# Patient Record
Sex: Female | Born: 2000 | Race: Black or African American | Hispanic: No | Marital: Single | Smoking: Former smoker
Health system: Southern US, Community
[De-identification: ages and names within clinical notes are randomized; demographics above are authoritative.]

## PROBLEM LIST (undated history)

## (undated) DIAGNOSIS — N944 Primary dysmenorrhea: Secondary | ICD-10-CM

## (undated) DIAGNOSIS — F329 Major depressive disorder, single episode, unspecified: Secondary | ICD-10-CM

## (undated) DIAGNOSIS — R519 Headache, unspecified: Secondary | ICD-10-CM

## (undated) DIAGNOSIS — N6019 Diffuse cystic mastopathy of unspecified breast: Secondary | ICD-10-CM

## (undated) DIAGNOSIS — Z7289 Other problems related to lifestyle: Secondary | ICD-10-CM

## (undated) DIAGNOSIS — F32A Depression, unspecified: Secondary | ICD-10-CM

## (undated) DIAGNOSIS — T7840XA Allergy, unspecified, initial encounter: Secondary | ICD-10-CM

## (undated) DIAGNOSIS — R51 Headache: Secondary | ICD-10-CM

## (undated) DIAGNOSIS — M25561 Pain in right knee: Secondary | ICD-10-CM

## (undated) DIAGNOSIS — L709 Acne, unspecified: Secondary | ICD-10-CM

## (undated) DIAGNOSIS — Z8659 Personal history of other mental and behavioral disorders: Secondary | ICD-10-CM

## (undated) DIAGNOSIS — Z915 Personal history of self-harm: Secondary | ICD-10-CM

## (undated) DIAGNOSIS — IMO0002 Reserved for concepts with insufficient information to code with codable children: Secondary | ICD-10-CM

## (undated) DIAGNOSIS — O133 Gestational [pregnancy-induced] hypertension without significant proteinuria, third trimester: Secondary | ICD-10-CM

## (undated) DIAGNOSIS — F419 Anxiety disorder, unspecified: Secondary | ICD-10-CM

## (undated) DIAGNOSIS — H5213 Myopia, bilateral: Secondary | ICD-10-CM

## (undated) DIAGNOSIS — J45909 Unspecified asthma, uncomplicated: Secondary | ICD-10-CM

## (undated) HISTORY — DX: Primary dysmenorrhea: N94.4

## (undated) HISTORY — DX: Pain in right knee: M25.561

## (undated) HISTORY — DX: Acne, unspecified: L70.9

## (undated) HISTORY — DX: Anxiety disorder, unspecified: F41.9

## (undated) HISTORY — DX: Major depressive disorder, single episode, unspecified: F32.9

## (undated) HISTORY — DX: Allergy, unspecified, initial encounter: T78.40XA

## (undated) HISTORY — DX: Reserved for concepts with insufficient information to code with codable children: IMO0002

## (undated) HISTORY — DX: Unspecified asthma, uncomplicated: J45.909

## (undated) HISTORY — DX: Headache, unspecified: R51.9

## (undated) HISTORY — DX: Personal history of other mental and behavioral disorders: Z86.59

## (undated) HISTORY — DX: Diffuse cystic mastopathy of unspecified breast: N60.19

## (undated) HISTORY — DX: Gestational (pregnancy-induced) hypertension without significant proteinuria, third trimester: O13.3

## (undated) HISTORY — DX: Headache: R51

## (undated) HISTORY — DX: Other problems related to lifestyle: Z72.89

## (undated) HISTORY — DX: Personal history of self-harm: Z91.5

## (undated) HISTORY — DX: Depression, unspecified: F32.A

## (undated) HISTORY — DX: Myopia, bilateral: H52.13

---
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
## 2019-01-19 ENCOUNTER — Ambulatory Visit: Payer: No Typology Code available for payment source | Admitting: Family Medicine

## 2019-01-31 ENCOUNTER — Telehealth: Payer: Self-pay | Admitting: Emergency Medicine

## 2019-01-31 ENCOUNTER — Encounter: Payer: Self-pay | Admitting: Family Medicine

## 2019-01-31 ENCOUNTER — Ambulatory Visit (INDEPENDENT_AMBULATORY_CARE_PROVIDER_SITE_OTHER): Payer: No Typology Code available for payment source | Admitting: Family Medicine

## 2019-01-31 ENCOUNTER — Other Ambulatory Visit: Payer: Self-pay

## 2019-01-31 VITALS — BP 122/84 | HR 97 | Temp 98.2°F | Resp 16 | Ht 66.0 in | Wt 175.1 lb

## 2019-01-31 DIAGNOSIS — J4540 Moderate persistent asthma, uncomplicated: Secondary | ICD-10-CM

## 2019-01-31 DIAGNOSIS — F4310 Post-traumatic stress disorder, unspecified: Secondary | ICD-10-CM

## 2019-01-31 DIAGNOSIS — F411 Generalized anxiety disorder: Secondary | ICD-10-CM

## 2019-01-31 DIAGNOSIS — F410 Panic disorder [episodic paroxysmal anxiety] without agoraphobia: Secondary | ICD-10-CM | POA: Diagnosis not present

## 2019-01-31 DIAGNOSIS — F339 Major depressive disorder, recurrent, unspecified: Secondary | ICD-10-CM | POA: Diagnosis not present

## 2019-01-31 DIAGNOSIS — F39 Unspecified mood [affective] disorder: Secondary | ICD-10-CM

## 2019-01-31 NOTE — Progress Notes (Signed)
Name: Grace [name]   MRN: [id number]    DOB: 04/12/2001   Date:01/31/2019       Progress Note  Subjective  Chief Complaint  Chief Complaint  Patient presents with  . Follow-up    HPI  Pt presents to follow up:  N/V: Has completely resolved since last visit.  No abdominal pain or diarrhea.  Still has some constipation - taking Miralax PRN.   Depression: Still very depressed, phq 9 is very high, under the care of [facility], advised to contact them back to adjust medication, she still sees a therapist also. She has history of SI, and endorses passive SI a few weeks ago but denies today.  She has broken up with her girlfriend and is currently dating a boyfriend who is in prison until October 2020. She ID's her boyfriends' mother as her source of support.    [facility]  PHQ-9 Total Score  23      Asthma: - Current Medication Regimen: Symbicort once daily - advised this is Rx'd BID; Using medications With/without: without issues - Symptoms are not well controlled -  - Nighttime Symptoms: weekly nighttime asthma symptoms - symptoms include non-productive cough - Daytime Symptoms: weekly daytime asthma symptoms - symptoms include non-productive cough - ER visits since last visit: - Missed work or school: - Precipitationg Factors/Irritants: animal dander, smoke, emotional upset  Patient Active Problem List   Diagnosis Date Noted  . Mood disorder (HCC) 11/15/2018  . PTSD (post-traumatic stress disorder) 11/15/2018  . Class 1 obesity due to excess calories without serious comorbidity with body mass index (BMI) of 30.0 to 30.9 in adult 11/15/2018  . Neutropenia (HCC) 08/06/2018  . Major depression, recurrent, chronic (HCC) 08/06/2018  . GAD (generalized anxiety disorder) 08/06/2018  . Panic attack 01/22/2016  . Chronic allergic rhinitis 12/20/2015  . Asthma, moderate persistent, poorly-controlled 12/20/2015  . Primary dysmenorrhea  12/20/2015    Past Surgical History:  Procedure Laterality Date  . LAPAROSCOPY  05/07/2017   Procedure: LAPAROSCOPY DIAGNOSTIC;  Surgeon: Feliberto GottronSchermerhorn, Ihor Austinhomas J, MD;  Location: [facility] ORS;  Service: Gynecology;;    Family History  Problem Relation Age of Onset  . Asthma Father   . Hypertension Father   . ADD / ADHD Brother     Social History   Socioeconomic History  . Marital status: Single    Spouse name: Not on file  . Number of children: 0  . Years of education: Not on file  . Highest education level: Some college, no degree  Occupational History  . Occupation: crew   Social Needs  . Financial resource strain: Very hard  . Food insecurity:    Worry: Often true    Inability: Often true  . Transportation needs:    Medical: Yes    Non-medical: Yes  Tobacco Use  . Smoking status: Never Smoker  . Smokeless tobacco: Never Used  Substance and Sexual Activity  . Alcohol use: No    Alcohol/week: 0.0 standard drinks  . Drug use: Yes    Types: Marijuana  . Sexual activity: Yes    Birth control/protection: None    Comment: homosexual   Lifestyle  . Physical activity:    Days per week: 2 days    Minutes per session: 30 min  . Stress: To some extent  Relationships  . Social connections:    Talks on phone: More than three times a week    Gets together: More than  three times a week    Attends religious service: Never    Active member of club or organization: No    Attends meetings of clubs or organizations: Never    Relationship status: Living with partner  . Intimate partner violence:    Fear of current or ex partner: No    Emotionally abused: No    Physically abused: No    Forced sexual activity: No  Other Topics Concern  . Not on file  Social History Narrative   Relationship with parents is a little better     Current Outpatient Medications:  .  albuterol (PROVENTIL HFA;VENTOLIN HFA) 108 (90 Base) MCG/ACT inhaler, Inhale 2 puffs into the lungs every 6 (six)  hours as needed for wheezing or shortness of breath., Disp: 1 Inhaler, Rfl: 1 .  ARIPiprazole (ABILIFY) 5 MG tablet, Take 1 tablet (5 mg total) by mouth daily., Disp: 30 tablet, Rfl: 0 .  budesonide-formoterol (SYMBICORT) 160-4.5 MCG/ACT inhaler, Inhale 2 puffs into the lungs 2 (two) times daily., Disp: 1 Inhaler, Rfl: 2 .  cetirizine (ZYRTEC) 10 MG tablet, Take 1 tablet (10 mg total) by mouth daily., Disp: 90 tablet, Rfl: 1 .  escitalopram (LEXAPRO) 5 MG tablet, Take 1 tablet (5 mg total) by mouth daily., Disp: 30 tablet, Rfl: 0 .  famotidine (PEPCID) 40 MG tablet, Take 1 tablet (40 mg total) by mouth at bedtime., Disp: 30 tablet, Rfl: 0 .  montelukast (SINGULAIR) 10 MG tablet, Take 1 tablet (10 mg total) by mouth at bedtime., Disp: 90 tablet, Rfl: 1 .  ondansetron (ZOFRAN) 4 MG tablet, Take 1 tablet (4 mg total) by mouth every 8 (eight) hours as needed for nausea or vomiting., Disp: 20 tablet, Rfl: 0 .  polyethylene glycol powder (GLYCOLAX/MIRALAX) 17 GM/SCOOP powder, Take 17 g by mouth daily., Disp: 3350 g, Rfl: 1 .  Spacer/Aero-Holding Chambers (OPTICHAMBER Elanor-LG MASK) DEVI, See admin instructions. use with inhaler, Disp: , Rfl: 0  Allergies  Allergen Reactions  . Mold Extract [Trichophyton] Other (See Comments)    Causes chest pain Per allergy test    I personally reviewed active problem list, medication list, allergies, notes from last encounter with the patient/caregiver today.   ROS  Ten systems reviewed and is negative except as mentioned in HPI.  Objective  Vitals:   01/31/19 1308  BP: 122/84  Pulse: 97  Resp: 16  Temp: 98.2 F (36.8 C)  TempSrc: Oral  SpO2: 99%  Weight: 175 lb 1.6 oz (79.4 kg)  Height: 5\' 6"  (1.676 m)    Body mass index is 28.26 kg/m.  Physical Exam Constitutional: Patient appears well-developed and well-nourished. No distress.  HENT: Head: Normocephalic and atraumatic. Ears: bilateral TMs with no erythema or effusion; Nose: Nose normal.  Mouth/Throat: Oropharynx is clear and moist. No oropharyngeal exudate or tonsillar swelling.  Eyes: Conjunctivae and EOM are normal. No scleral icterus.  Pupils are equal, round, and reactive to light.  Neck: Normal range of motion. Neck supple. No JVD present. No thyromegaly present.  Cardiovascular: Normal rate, regular rhythm and normal heart sounds.  No murmur heard. No BLE edema. Pulmonary/Chest: Effort normal and breath sounds normal. No respiratory distress. Musculoskeletal: Normal range of motion, no joint effusions. No gross deformities Neurological: Pt is alert and oriented to person, place, and time. No cranial nerve deficit. Coordination, balance, strength, speech and gait are normal.  Skin: Skin is warm and dry. No rash noted. No erythema.  Psychiatric: Patient has a normal mood and affect.  behavior is normal. Judgment and thought content normal.  No results found for this or any previous visit (from the past 72 hour(s)).  PHQ2/9: [facility] 01/31/2019 12/20/2018 12/06/2018 11/15/2018 09/03/2018  Decreased Interest 3 2 2 3 3   Down, Depressed, Hopeless 3 2 2 2 3   PHQ - 2 Score 6 4 4 5 6   Altered sleeping 2 3 2 3 3   Tired, decreased energy 2 2 3 3 3   Change in appetite 3 3 2 3 3   Feeling bad or failure about yourself  3 3 3 3 3   Trouble concentrating 3 3 3 2 2   Moving slowly or fidgety/restless 3 2 2 3 3   Suicidal thoughts 1 1 0 - 2  PHQ-9 Score 23 21 19 22 25   Difficult doing work/chores Very difficult Very difficult Very difficult Very difficult Extremely dIfficult  Some recent data might be hidden   PHQ-2/9 Result is positive.    Fall Risk: Fall Risk  12/20/2018 12/06/2018 11/15/2018 09/03/2018 08/27/2018  Falls in the past year? 0 0 0 0 0  Number falls in past yr: 0 0 0 0 0  Injury with Fall? 0 0 0 0 0  Follow up - - Falls evaluation completed Falls evaluation completed -   Assessment & Plan  1. Asthma, moderate persistent, poorly-controlled - Advised  Symicort should be TWICE daily, not once to help control cough a bit better. Albuterol PRN  2. PTSD (post-traumatic stress disorder) 3. Panic attack 4. Major depression, recurrent, chronic (HCC) 5. Mood disorder (HCC) 6. GAD (generalized anxiety disorder) - She is seeing [facility] but has not been able to get through, Myriam JacobsonHelen CMA will reach out to them today.  She is given the Mobile Crisis number again and she has it saved in her phone now.  She does contract for safety and agrees to call mobile crisis or 911 if she has self harm or suicidal ideation; she is living with her parents again and ID's her Dad and her boyfriends' mother as her support persons..Marland Kitchen

## 2019-01-31 NOTE — Patient Instructions (Signed)
Here are some resources to help you if you feel you are in a mental health crisis:  National Suicide Prevention Lifeline - Call 1-800-273-8255  for help - Website with more resources: https://suicidepreventionlifeline.org/  [facility] Mobile Crisis Program - Call 336-538-1220 for help. - Mobile Crisis Program available 24 hours a day, 365 days a year. - Available for anyone of any age in [place].  [facility] - Address: [address], [place] - Telephone: [phone]  - Hours of Operation: Sunday - Saturday - 8:00 a.m. - 8:00 p.m. - Medicaid, Medicare (Government Issued Only), BCBS, and Cash - Pay - Crisis Management, Outpatient Individual & Group Therapy, Psychiatrists on-site to provide medication management, In-Home Psychiatric Care, and Peer Support Care.  [facility] - Call 1-877-626-1772 for help. - Mobile Crisis Program available 24 hours a day, 365 days a year. - Available for anyone of any age in [place]    

## 2019-01-31 NOTE — Telephone Encounter (Signed)
Patient notified. Spoke to [employer] gave [name] the therapist number Gareth Eagle at [phone] for her to call and schedule appointment. [facility] appointment with doctor is scheduled for July 8th at 1:20pm

## 2019-02-16 ENCOUNTER — Encounter: Payer: Self-pay | Admitting: Family Medicine

## 2019-02-17 ENCOUNTER — Encounter: Payer: Self-pay | Admitting: Family Medicine

## 2019-02-21 NOTE — Telephone Encounter (Signed)
Pt states that the company advise they have not received fax. Pt would like the physical form refaxed to [phone]/ please advise

## 2019-03-02 ENCOUNTER — Other Ambulatory Visit: Payer: Self-pay

## 2019-03-02 ENCOUNTER — Ambulatory Visit (LOCAL_COMMUNITY_HEALTH_CENTER): Payer: No Typology Code available for payment source

## 2019-03-02 DIAGNOSIS — Z113 Encounter for screening for infections with a predominantly sexual mode of transmission: Secondary | ICD-10-CM

## 2019-03-02 DIAGNOSIS — N760 Acute vaginitis: Secondary | ICD-10-CM | POA: Diagnosis not present

## 2019-03-02 DIAGNOSIS — B9689 Other specified bacterial agents as the cause of diseases classified elsewhere: Secondary | ICD-10-CM | POA: Diagnosis not present

## 2019-03-02 LAB — WET PREP FOR TRICH, YEAST, CLUE
Trichomonas Exam: NEGATIVE
Yeast Exam: NEGATIVE

## 2019-03-02 MED ORDER — METRONIDAZOLE 500 MG PO TABS: 500.0000 mg | ORAL_TABLET | Freq: Two times a day (BID) | ORAL | 0 refills | Status: AC

## 2019-03-02 NOTE — Progress Notes (Signed)
    STI clinic/screening visit  Subjective:  Grace [name] is a 18 y.o. female being seen today for an STI screening visit. The patient reports they do not have symptoms.  Patient has the following medical conditionshas Chronic allergic rhinitis; Asthma, moderate persistent, poorly-controlled; Primary dysmenorrhea; Panic attack; Neutropenia ([place]); Major depression, recurrent, chronic ([place]); GAD (generalized anxiety disorder); Mood disorder ([place]); PTSD (post-traumatic stress disorder); and Class 1 obesity due to excess calories without serious comorbidity with body mass index (BMI) of 30.0 to 30.9 in adult on their problem list.  Chief Complaint  Patient presents with  . SEXUALLY TRANSMITTED DISEASE    Patient reports  HPI   See flowsheet for further details and programmatic requirements.    The following portions of the patient's history were reviewed and updated as appropriate: allergies, current medications, past family history, past medical history, past social history, past surgical history and problem list. Problem list updated.  Objective:  There were no vitals filed for this visit.  Physical Exam Genitourinary:    General: Normal vulva.     Vagina: Vaginal discharge present.     Comments: Ph,4.5; modf amt of white , thick disch.      Assessment and Plan:  Grace [name] is a 18 y.o. female presenting to the [place] for STI screening  1. Screening examination for venereal disease Treat wet prep - Chlamydia/Gonorrhea [facility] - HIV [place] [facility] - Syphilis Serology, [facility] - WET PREP FOR TRICH, YEAST, CLUE     No follow-ups on file.  Future Appointments  Date Time Provider [place]  03/04/2019 10:00 AM Steele Sizer, MD [place] [facility]  06/02/2019 11:00 AM Hubbard Hartshorn, FNP [place], FNP

## 2019-03-02 NOTE — Progress Notes (Signed)
Wet mount reviewed; per verbal order by C.Marzetta Board, FNP, pt treated for BV per standing order.

## 2019-03-04 ENCOUNTER — Ambulatory Visit: Payer: No Typology Code available for payment source | Admitting: Family Medicine

## 2019-03-09 ENCOUNTER — Other Ambulatory Visit: Payer: Self-pay

## 2019-03-09 ENCOUNTER — Encounter: Payer: Self-pay | Admitting: Emergency Medicine

## 2019-03-09 ENCOUNTER — Emergency Department
Admission: EM | Admit: 2019-03-09 | Discharge: 2019-03-09 | Disposition: A | Discharge status: Home / Self Care | Payer: No Typology Code available for payment source | Attending: Emergency Medicine | Admitting: Emergency Medicine

## 2019-03-09 DIAGNOSIS — R55 Syncope and collapse: Secondary | ICD-10-CM | POA: Insufficient documentation

## 2019-03-09 DIAGNOSIS — Z20828 Contact with and (suspected) exposure to other viral communicable diseases: Secondary | ICD-10-CM | POA: Diagnosis not present

## 2019-03-09 DIAGNOSIS — F17200 Nicotine dependence, unspecified, uncomplicated: Secondary | ICD-10-CM | POA: Insufficient documentation

## 2019-03-09 DIAGNOSIS — J45909 Unspecified asthma, uncomplicated: Secondary | ICD-10-CM | POA: Diagnosis not present

## 2019-03-09 DIAGNOSIS — Z79899 Other long term (current) drug therapy: Secondary | ICD-10-CM | POA: Insufficient documentation

## 2019-03-09 LAB — CBC
HCT: 42 % (ref 36.0–46.0)
Hemoglobin: 13.5 g/dL (ref 12.0–15.0)
MCH: 27.4 pg (ref 26.0–34.0)
MCHC: 32.1 g/dL (ref 30.0–36.0)
MCV: 85.2 fL (ref 80.0–100.0)
Platelets: 392 10*3/uL (ref 150–400)
RBC: 4.93 MIL/uL (ref 3.87–5.11)
RDW: 15.6 % — ABNORMAL HIGH (ref 11.5–15.5)
WBC: 5.6 10*3/uL (ref 4.0–10.5)
nRBC: 0 % (ref 0.0–0.2)

## 2019-03-09 LAB — URINALYSIS, COMPLETE (UACMP) WITH MICROSCOPIC
Bacteria, UA: NONE SEEN
Bilirubin Urine: NEGATIVE
Glucose, UA: NEGATIVE mg/dL
Ketones, ur: NEGATIVE mg/dL
Nitrite: NEGATIVE
Protein, ur: 30 mg/dL — AB
RBC / HPF: >50 RBC/hpf — ABNORMAL HIGH (ref 0–5)
Specific Gravity, Urine: 1.02 (ref 1.005–1.030)
pH: 7 (ref 5.0–8.0)

## 2019-03-09 LAB — COMPREHENSIVE METABOLIC PANEL
ALT: 19 U/L (ref 0–44)
AST: 14 U/L — ABNORMAL LOW (ref 15–41)
Albumin: 4 g/dL (ref 3.5–5.0)
Alkaline Phosphatase: 67 U/L (ref 38–126)
Anion gap: 7 (ref 5–15)
BUN: 9 mg/dL (ref 6–20)
CO2: 25 mmol/L (ref 22–32)
Calcium: 8.7 mg/dL — ABNORMAL LOW (ref 8.9–10.3)
Chloride: 106 mmol/L (ref 98–111)
Creatinine, Ser: 0.82 mg/dL (ref 0.44–1.00)
GFR calc Af Amer: >60 mL/min (ref >60)
GFR calc non Af Amer: >60 mL/min (ref >60)
Glucose, Bld: 86 mg/dL (ref 70–99)
Potassium: 4.2 mmol/L (ref 3.5–5.1)
Sodium: 138 mmol/L (ref 135–145)
Total Bilirubin: 0.7 mg/dL (ref 0.3–1.2)
Total Protein: 7.1 g/dL (ref 6.5–8.1)

## 2019-03-09 LAB — HCG, QUANTITATIVE, PREGNANCY: hCG, Beta Chain, Quant, S: <1 m[IU]/mL (ref <5)

## 2019-03-09 LAB — POCT PREGNANCY, URINE: Preg Test, Ur: NEGATIVE

## 2019-03-09 MED ORDER — SODIUM CHLORIDE 0.9% FLUSH: 3.0000 mL | Freq: Once | INTRAVENOUS | Status: DC

## 2019-03-09 NOTE — ED Notes (Signed)
ED Provider at bedside. 

## 2019-03-09 NOTE — ED Notes (Signed)
Nausea and near syncope. Light vaginal bleeding since yesterday.

## 2019-03-09 NOTE — ED Notes (Signed)
Pt alert and oriented X4, active, cooperative, pt in NAD. RR even and unlabored, color WNL.  Pt informed to return if any life threatening symptoms occur.  Discharge and followup instructions reviewed.  

## 2019-03-09 NOTE — ED Triage Notes (Signed)
First RN Note: Pt reports having vaginal bleeding that began yesterday, pt reports 1 episode of emesis today while on the way to work with hot flashes and near syncope. Pt A&O x 4, ambulatory without difficulty to the front desk at this time.

## 2019-03-09 NOTE — Discharge Instructions (Signed)
Your labs are reassuring.  You should follow-up with your primary care doctor tomorrow if you continue to have abdominal pain.  Also if you have any other concerns you should return to the ER.  You develop fevers worsening vomiting or any other concerns.  We will also test you for coronavirus.  Stay quarantined at home.

## 2019-03-09 NOTE — ED Provider Notes (Signed)
[facility] Emergency Department Provider Note  ____________________________________________   First MD Initiated Contact with Patient 03/09/19 1201     (approximate)  I have reviewed the triage vital signs and the nursing notes.   HISTORY  Chief Complaint Near Syncope and Vaginal Bleeding    HPI Grace [name] is a 18 y.o. female with history of depression who presents for syncope.  Patient had some vaginal bleeding that started yesterday.  The bleeding has been minimal in nature, constant, nothing makes it better or worse.  She feels like it is too early for her mestruation To be starting.  Had one episode of emesis today while at work.  Patient started feeling warm and like she was having hot flashes and then almost passed out but did not actually lose consciousness.  Endorses fatigue over the past few days as well.    Past Medical History:  Diagnosis Date  . Acne   . Allergy    ALLERGIC RHINITIS  . Anxiety   . Asthma   . Deliberate self-cutting   . Depression   . Fibrocystic disease of breast   . Head pain   . History of depression 12/20/2015  . History of self-harm   . Knee pain, right   . Primary dysmenorrhea   . Severe myopia of both eyes     Patient Active Problem List   Diagnosis Date Noted  . BV (bacterial vaginosis) 03/02/2019  . Mood disorder (HCC) 11/15/2018  . PTSD (post-traumatic stress disorder) 11/15/2018  . Class 1 obesity due to excess calories without serious comorbidity with body mass index (BMI) of 30.0 to 30.9 in adult 11/15/2018  . Neutropenia (HCC) 08/06/2018  . Major depression, recurrent, chronic (HCC) 08/06/2018  . GAD (generalized anxiety disorder) 08/06/2018  . Panic attack 01/22/2016  . Chronic allergic rhinitis 12/20/2015  . Asthma, moderate persistent, poorly-controlled 12/20/2015  . 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 LAPAROSCOPY  05/07/2017   Procedure: LAPAROSCOPY DIAGNOSTIC;  Surgeon: Feliberto GottronSchermerhorn, Ihor Austinhomas J, MD;  Location: [facility] ORS;  Service: Gynecology;;    Prior to Admission medications   Medication Sig Start Date End Date Taking? Authorizing Provider  albuterol (PROVENTIL HFA;VENTOLIN HFA) 108 (90 Base) MCG/ACT inhaler Inhale 2 puffs into the lungs every 6 (six) hours as needed for wheezing or shortness of breath. 11/15/18   Doren CustardBoyce, Emily E, FNP  ARIPiprazole (ABILIFY) 5 MG tablet Take 1 tablet (5 mg total) by mouth daily. 09/03/18   Doren CustardBoyce, Emily E, FNP  budesonide-formoterol (SYMBICORT) 160-4.5 MCG/ACT inhaler Inhale 2 puffs into the lungs 2 (two) times daily. 11/15/18   Doren CustardBoyce, Emily E, FNP  cetirizine (ZYRTEC) 10 MG tablet Take 1 tablet (10 mg total) by mouth daily. 11/15/18   Doren CustardBoyce, Emily E, FNP  escitalopram (LEXAPRO) 5 MG tablet Take 1 tablet (5 mg total) by mouth daily. 09/03/18   Doren CustardBoyce, Emily E, FNP  famotidine (PEPCID) 40 MG tablet Take 1 tablet (40 mg total) by mouth at bedtime. 12/20/18   Alba CorySowles, Krichna, MD  metroNIDAZOLE (FLAGYL) 500 MG tablet Take 1 tablet (500 mg total) by mouth 2 (two) times daily for 7 days. 03/02/19 03/09/19  Larene PickettLatta, Cynthia, FNP  montelukast (SINGULAIR) 10 MG tablet Take 1 tablet (10 mg total) by mouth at bedtime. 11/15/18   Doren CustardBoyce, Emily E, FNP  polyethylene glycol powder (GLYCOLAX/MIRALAX) 17 GM/SCOOP powder Take 17 g by mouth daily. 12/20/18   Alba CorySowles, Krichna, MD  Spacer/Aero-Holding [facility](OPTICHAMBER  Kerstin-LG MASK) DEVI See admin instructions. use with inhaler 08/29/16   [provider]    Allergies Mold extract [trichophyton]  Family History  Problem Relation Age of Onset  . Asthma Father   . Hypertension Father   . ADD / ADHD Brother     Social History Social History   Tobacco Use  . Smoking status: Current Every Day Smoker    Start date: 03/02/2007  . Smokeless tobacco: Never Used  . Tobacco comment: unknown date to quit  Substance Use Topics  . Alcohol use: No     Alcohol/week: 0.0 standard drinks  . Drug use: Yes    Frequency: 1.0 times per week    Types: Marijuana      Review of Systems Constitutional: No fever/chills positive for fatigue Eyes: No visual changes. ENT: No sore throat. Cardiovascular: Denies chest pain. Respiratory: Denies shortness of breath. Gastrointestinal: No abdominal pain.  No nausea, no vomiting.  No diarrhea.  No constipation. Genitourinary: Negative for dysuria.  Positive for vaginal bleeding Musculoskeletal: Negative for back pain. Skin: Negative for rash. Neurological: Negative for headaches, focal weakness or numbness.  Positive for near syncope All other ROS negative ____________________________________________   PHYSICAL EXAM:  VITAL SIGNS: ED Triage Vitals  Enc Vitals Group     BP 03/09/19 1028 120/75     Pulse Rate 03/09/19 1028 86     Resp 03/09/19 1028 16     Temp 03/09/19 1028 98.2 F (36.8 C)     Temp Source 03/09/19 1028 Oral     SpO2 03/09/19 1028 98 %     Weight 03/09/19 1027 180 lb (81.6 kg)     Height 03/09/19 1027 5\' 6"  (1.676 m)     Head Circumference --      Peak Flow --      Pain Score 03/09/19 1027 7     Pain Loc --      Pain Edu? --      Excl. in GC? --     Constitutional: Alert and oriented. Well appearing and in no acute distress. Eyes: Conjunctivae are normal. EOMI. Head: Atraumatic. Nose: No congestion/rhinnorhea. Mouth/Throat: Mucous membranes are moist.   Neck: No stridor. Trachea Midline. FROM Cardiovascular: Normal rate, regular rhythm. Grossly normal heart sounds.  Good peripheral circulation. Respiratory: Normal respiratory effort.  No retractions. Lungs CTAB. Gastrointestinal: Soft and nontender. No distention. No abdominal bruits.  Musculoskeletal: No lower extremity tenderness nor edema.  No joint effusions. Neurologic:  Normal speech and language. No gross focal neurologic deficits are appreciated.  Skin:  Skin is warm, dry and intact. No rash noted.  Psychiatric: Mood and affect are normal. Speech and behavior are normal. GU: Deferred   ____________________________________________   LABS (all labs ordered are listed, but only abnormal results are displayed)  Labs Reviewed  CBC - Abnormal; Notable for the following components:      Result Value   RDW 15.6 (*)    All other components within normal limits  COMPREHENSIVE METABOLIC PANEL - Abnormal; Notable for the following components:   Calcium 8.7 (*)    AST 14 (*)    All other components within normal limits  URINALYSIS, COMPLETE (UACMP) WITH MICROSCOPIC  POC URINE PREG, ED   ____________________________________________   ED ECG REPORT I, Concha SeMary E Breasia Karges, the attending physician, personally viewed and interpreted this ECG.  EKG is sinus rate of 82, no ST elevation, no T wave inversion, normal intervals, no evidence of any arrhythmias. ____________________________________________  ____________________________________________  PROCEDURES  Procedure(s) performed (including Critical Care):  Procedures   ____________________________________________   INITIAL IMPRESSION / ASSESSMENT AND PLAN / ED COURSE  Grace [name] was evaluated in Emergency Department on 03/09/2019 for the symptoms described in the history of present illness. She was evaluated in the context of the global COVID-19 pandemic, which necessitated consideration that the patient might be at risk for infection with the SARS-CoV-2 virus that causes COVID-19. Institutional protocols and algorithms that pertain to the evaluation of patients at risk for COVID-19 are in a state of rapid change based on information released by regulatory bodies including the CDC and federal and state organizations. These policies and algorithms were followed during the patient's care in the ED.    Given patient's near syncope will get EKG to evaluate for arrhythmia.  Will also get hCG to evaluate for ectopic pregnancy.  Will even get  a blood hCG given patient has a family history of ectopic and with the early vaginal bleeding.  Will also check labs to evaluate for dehydration.  May need outpatient coronavirus testing.   Labs are reassuring with a normal white count making infectious less likely as well as normal hemoglobin.  1:23 PM reevaluated patient.  Patient is now endorsing a little bit of mild right lower quadrant tenderness.  Patient's white count is normal and patient has no fever however asked patient if she wanted to stay for a CT scan to evaluate for appendicitis.  Patient at this time would like to hold off on CT and would rather follow-up outpatient.  Patient's pain did not sound consistent with torsion.  Again offered additional testing but patient said that she would rather leave at this time.  I discussed with patient return precautions with this pain.  Patient was open to having coronavirus testing sent.  Patient understands that she should have a recheck of her abdominal pain by tomorrow if it is not resolved.  I also discussed with her return precautions to suggest appendicitis or torsion.   ____________________________________________   FINAL CLINICAL IMPRESSION(S) / ED DIAGNOSES   Final diagnoses:  Near syncope      MEDICATIONS GIVEN DURING THIS VISIT:  Medications  sodium chloride flush (NS) 0.9 % injection 3 mL (has no administration in time range)     ED Discharge Orders    None       Note:  This document was prepared using Dragon voice recognition software and may include unintentional dictation errors.   Vanessa Smithton, MD 03/09/19 1620

## 2019-03-10 LAB — NOVEL CORONAVIRUS, NAA (HOSP ORDER, SEND-OUT TO REF LAB; TAT 18-24 HRS): SARS-CoV-2, NAA: NOT DETECTED

## 2019-03-11 ENCOUNTER — Ambulatory Visit: Payer: No Typology Code available for payment source

## 2019-03-11 ENCOUNTER — Other Ambulatory Visit: Payer: Self-pay

## 2019-03-11 DIAGNOSIS — A749 Chlamydial infection, unspecified: Secondary | ICD-10-CM | POA: Diagnosis not present

## 2019-03-11 DIAGNOSIS — Z113 Encounter for screening for infections with a predominantly sexual mode of transmission: Secondary | ICD-10-CM | POA: Diagnosis not present

## 2019-03-11 DIAGNOSIS — A549 Gonococcal infection, unspecified: Secondary | ICD-10-CM | POA: Diagnosis not present

## 2019-03-11 MED ORDER — AZITHROMYCIN 500 MG PO TABS
1000.0000 mg | ORAL_TABLET | Freq: Once | ORAL | Status: AC
  Administered 2019-03-11: 16:00:00 1000 mg via ORAL

## 2019-03-11 MED ORDER — CEFTRIAXONE SODIUM 250 MG IJ SOLR
250.0000 mg | Freq: Once | INTRAMUSCULAR | Status: AC
  Administered 2019-03-11: 16:00:00 250 mg via INTRAMUSCULAR

## 2019-03-23 ENCOUNTER — Encounter: Payer: Self-pay | Admitting: Family Medicine

## 2019-03-24 NOTE — Telephone Encounter (Unsigned)
Copied from [place] [id number]. 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  Pt states she would like you to send to them asap. Pt has sent mychart message.  I advised message states the form would be faxed today, but she did not seem to think that was enough.  I said I would mark urgent.

## 2019-04-05 ENCOUNTER — Encounter: Payer: Self-pay | Admitting: Family Medicine

## 2019-04-05 ENCOUNTER — Ambulatory Visit (INDEPENDENT_AMBULATORY_CARE_PROVIDER_SITE_OTHER): Payer: No Typology Code available for payment source | Admitting: Family Medicine

## 2019-04-05 DIAGNOSIS — Z3490 Encounter for supervision of normal pregnancy, unspecified, unspecified trimester: Secondary | ICD-10-CM

## 2019-04-05 DIAGNOSIS — Z720 Tobacco use: Secondary | ICD-10-CM | POA: Diagnosis not present

## 2019-04-05 DIAGNOSIS — N920 Excessive and frequent menstruation with regular cycle: Secondary | ICD-10-CM | POA: Diagnosis not present

## 2019-04-05 MED ORDER — PRENATAL MULTIVITAMIN CH: 1.0000 | ORAL_TABLET | Freq: Every day | ORAL | 1 refills | Status: DC

## 2019-04-05 NOTE — Patient Instructions (Signed)

## 2019-04-05 NOTE — Progress Notes (Signed)
Name: Grace [name]   MRN: [id number]    DOB: [birth year]   Date:04/05/2019       Progress Note  Subjective  Chief Complaint  Chief Complaint  Patient presents with  . Emesis  . Vaginal Bleeding    I connected with  Grace [name]  on 04/05/19 at  9:40 AM EDT by a video enabled telemedicine application and verified that I am speaking with the correct person using two identifiers.  I discussed the limitations of evaluation and management by telemedicine and the availability of in person appointments. The patient expressed understanding and agreed to proceed. Staff also discussed with the patient that there may be a patient responsible charge related to this service. Patient Location: at home Provider [facility]: [facility]   HPI  Vaginal Bleeding: [facility] for near syncope , light vaginal bleeding, she had a normal CBC, comp panel and negative for STI. She is trying to get pregnant. Having intercourse every day for the past 2 weeks. She states based on otc testing she is ovulating, had bleeding since Sunday it lasted two days followed by some light spotting  She has noticed some headaches, nausea and vomiting yesterday and had to leave work . She states she has been taking pre-natal vitamins, she smokes but is wiling to quit to get pregnant, she does not drink alcohol    Patient Active Problem List   Diagnosis Date Noted  . BV (bacterial vaginosis) 03/02/2019  . Mood disorder (HCC) 11/15/2018  . PTSD (post-traumatic stress disorder) 11/15/2018  . Class 1 obesity due to excess calories without serious comorbidity with body mass index (BMI) of 30.0 to 30.9 in adult 11/15/2018  . Neutropenia (HCC) 08/06/2018  . Major depression, recurrent, chronic (HCC) 08/06/2018  . GAD (generalized anxiety disorder) 08/06/2018  . Panic attack 01/22/2016  . Chronic allergic rhinitis 12/20/2015  . Asthma, moderate persistent, poorly-controlled 12/20/2015  .  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 LAPAROSCOPY  05/07/2017   Procedure: LAPAROSCOPY DIAGNOSTIC;  Surgeon: Feliberto GottronSchermerhorn, Ihor Austinhomas J, MD;  Location: [facility] ORS;  Service: Gynecology;;    Family History  Problem Relation Age of Onset  . Asthma Father   . Hypertension Father   . ADD / ADHD Brother     Social History   Socioeconomic History  . Marital status: Single    Spouse name: Not on file  . Number of children: 0  . Years of education: Not on file  . Highest education level: Some college, no degree  Occupational History  . Occupation: crew   Social Needs  . Financial resource strain: Very hard  . Food insecurity    Worry: Often true    Inability: Often true  . Transportation needs    Medical: Yes    Non-medical: Yes  Tobacco Use  . Smoking status: Current Every Day Smoker    Start date: 03/02/2007  . Smokeless tobacco: Never Used  . Tobacco comment: unknown date to quit  Substance and Sexual Activity  . Alcohol use: No    Alcohol/week: 0.0 standard drinks  . Drug use: Yes    Frequency: 1.0 times per week    Types: Marijuana  . Sexual activity: Yes    Partners: Male    Birth control/protection: Condom    Comment: homosexual   Lifestyle  . Physical activity    Days per week: 2 days  Minutes per session: 30 min  . Stress: To some extent  Relationships  . Social connections    Talks on phone: More than three times a week    Gets together: More than three times a week    Attends religious service: Never    Active member of club or organization: No    Attends meetings of clubs or organizations: Never    Relationship status: Living with partner  . Intimate partner violence    Fear of current or ex partner: No    Emotionally abused: No    Physically abused: No    Forced sexual activity: No  Other Topics Concern  . Not on file  Social History Narrative   Relationship with parents is a little better      Current Outpatient Medications:  .  albuterol (PROVENTIL HFA;VENTOLIN HFA) 108 (90 Base) MCG/ACT inhaler, Inhale 2 puffs into the lungs every 6 (six) hours as needed for wheezing or shortness of breath., Disp: 1 Inhaler, Rfl: 1 .  ARIPiprazole (ABILIFY) 5 MG tablet, Take 1 tablet (5 mg total) by mouth daily., Disp: 30 tablet, Rfl: 0 .  budesonide-formoterol (SYMBICORT) 160-4.5 MCG/ACT inhaler, Inhale 2 puffs into the lungs 2 (two) times daily., Disp: 1 Inhaler, Rfl: 2 .  cetirizine (ZYRTEC) 10 MG tablet, Take 1 tablet (10 mg total) by mouth daily., Disp: 90 tablet, Rfl: 1 .  famotidine (PEPCID) 40 MG tablet, Take 1 tablet (40 mg total) by mouth at bedtime., Disp: 30 tablet, Rfl: 0 .  montelukast (SINGULAIR) 10 MG tablet, Take 1 tablet (10 mg total) by mouth at bedtime., Disp: 90 tablet, Rfl: 1 .  polyethylene glycol powder (GLYCOLAX/MIRALAX) 17 GM/SCOOP powder, Take 17 g by mouth daily., Disp: 3350 g, Rfl: 1 .  sertraline (ZOLOFT) 50 MG tablet, TK 1 T PO QD, Disp: , Rfl:  .  Spacer/Aero-Holding Chambers (OPTICHAMBER [place]-LG MASK) DEVI, See admin instructions. use with inhaler, Disp: , Rfl: 0  Allergies  Allergen Reactions  . Mold Extract [Trichophyton] Other (See Comments)    Causes chest pain Per allergy test    I personally reviewed active problem list, medication list, allergies, family history, social history with the patient/caregiver today.   ROS  Ten systems reviewed and is negative except as mentioned in HPI   Objective  Virtual encounter, vitals not obtained.  There is no height or weight on file to calculate BMI.  Physical Exam  Awake, alert and oriented   PHQ2/9: Depression screen [facility] 2/9 04/05/2019 01/31/2019 12/20/2018 12/06/2018 11/15/2018  Decreased Interest 0 3 2 2 3   Down, Depressed, Hopeless 1 3 2 2 2   PHQ - 2 Score 1 6 4 4 5   Altered sleeping 0 2 3 2 3   Tired, decreased energy 3 2 2 3 3   Change in appetite 0 3 3 2 3   Feeling bad or failure about yourself  1  3 3 3 3   Trouble concentrating 1 3 3 3 2   Moving slowly or fidgety/restless 0 3 2 2 3   Suicidal thoughts 0 1 1 0 -  PHQ-9 Score 6 23 21 19 22   Difficult doing work/chores Not difficult at all Very difficult Very difficult Very difficult Very difficult  Some recent data might be hidden   PHQ-2/9 Result is positive.    Fall Risk: Fall Risk  12/20/2018 12/06/2018 11/15/2018 09/03/2018 08/27/2018  Falls in the past year? 0 0 0 0 0  Number falls in past yr: 0 0 0 0 0  Injury with Fall? 0 0 0 0 0  Follow up - - Falls evaluation completed Falls evaluation completed -    Assessment & Plan  1. Prenatal care, antepartum   2. Spotting   3. Tobacco consumption  Discussed importance of quitting   I discussed the assessment and treatment plan with the patient. The patient was provided an opportunity to ask questions and all were answered. The patient agreed with the plan and demonstrated an understanding of the instructions.  The patient was advised to call back or seek an in-person evaluation if the symptoms worsen or if the condition fails to improve as anticipated.  I provided 15  minutes of non-face-to-face time during this encounter.

## 2019-04-22 ENCOUNTER — Other Ambulatory Visit: Payer: Self-pay | Admitting: Family Medicine

## 2019-04-22 DIAGNOSIS — R112 Nausea with vomiting, unspecified: Secondary | ICD-10-CM

## 2019-05-03 ENCOUNTER — Ambulatory Visit (HOSPITAL_COMMUNITY)
Admission: EM | Admit: 2019-05-03 | Discharge: 2019-05-03 | Disposition: A | Discharge status: Home / Self Care | Payer: No Typology Code available for payment source

## 2019-05-03 ENCOUNTER — Emergency Department (HOSPITAL_COMMUNITY)
Admission: EM | Admit: 2019-05-03 | Discharge: 2019-05-03 | Discharge status: Against Medical Advice | Payer: No Typology Code available for payment source

## 2019-05-03 NOTE — ED Notes (Signed)
Patient check in thinking she would be able to have an ultrasound and complete GYN work up.  After checking in she decided to go to the ED for Excessive vaginal bleeding and pain.

## 2019-05-03 NOTE — ED Notes (Signed)
UNable to locate in waiting room.

## 2019-05-13 ENCOUNTER — Other Ambulatory Visit: Payer: Self-pay | Admitting: Family Medicine

## 2019-05-13 ENCOUNTER — Encounter: Payer: Self-pay | Admitting: Family Medicine

## 2019-05-13 ENCOUNTER — Ambulatory Visit (INDEPENDENT_AMBULATORY_CARE_PROVIDER_SITE_OTHER): Payer: No Typology Code available for payment source | Admitting: Family Medicine

## 2019-05-13 ENCOUNTER — Other Ambulatory Visit: Payer: Self-pay

## 2019-05-13 VITALS — BP 114/80 | HR 94 | Temp 97.7°F | Resp 16 | Ht 66.0 in | Wt 187.7 lb

## 2019-05-13 DIAGNOSIS — N923 Ovulation bleeding: Secondary | ICD-10-CM | POA: Diagnosis not present

## 2019-05-13 DIAGNOSIS — F39 Unspecified mood [affective] disorder: Secondary | ICD-10-CM | POA: Diagnosis not present

## 2019-05-13 DIAGNOSIS — J4540 Moderate persistent asthma, uncomplicated: Secondary | ICD-10-CM

## 2019-05-13 DIAGNOSIS — R112 Nausea with vomiting, unspecified: Secondary | ICD-10-CM

## 2019-05-13 MED ORDER — BUDESONIDE-FORMOTEROL FUMARATE 160-4.5 MCG/ACT IN AERO: 2.0000 | INHALATION_SPRAY | Freq: Two times a day (BID) | RESPIRATORY_TRACT | 2 refills | Status: DC

## 2019-05-13 MED ORDER — MONTELUKAST SODIUM 10 MG PO TABS: 10.0000 mg | ORAL_TABLET | Freq: Every day | ORAL | 1 refills | Status: DC

## 2019-05-13 NOTE — Progress Notes (Signed)
Name: Grace MalletDiamond R [name]   MRN: [id number]    DOB: February 16, 2001   Date:05/13/2019       Progress Note  Subjective  Chief Complaint  Chief Complaint  Patient presents with  . Vaginal Bleeding    Bleeding has resolved, but she still wanted to see you.    HPI  Vaginal bleeding: he last three cycles were 03/08/2019,  04/03/2019,  04/25/2019 , and lasted until 04/29/2019  And re-started on 05/02/2019 normal in flow and lasted 3-4 days, she never had cycles this close together and was concerned about, and because it was painful [facility] . She is sexually active , same partner past 3 months, she has not been using protection. They are trying to conceive . She had a negative hcG, negative GC test, normal CBC normal Comp panel except for slight elevation AST. She is feeling well since, no more bleeding. Discussed keeping a log of her cycles. She asked me about clomid and something to help her get pregnant. Explained no reason to do that since she just started sexual activity with this partner and it may take one year to conceive.   Mood Disorder:  still very depressed, phq 9 is higher than previously at 14 , under the care of [facility], advised to contact them back to adjust medication, she still sees a therapist also. No suicidal thoughts or ideation She states work has been stressful and also worried about not getting pregnant She has been getting more moody and snappy.   Asthma Moderate: she still has nocturnal cough nightly, she has not been taking Symbicort on a regular basis and has noticed that symptoms not as controlled as it used to be. No wheezing or SOB   Patient Active Problem List   Diagnosis Date Noted  . BV (bacterial vaginosis) 03/02/2019  . Mood disorder (HCC) 11/15/2018  . PTSD (post-traumatic stress disorder) 11/15/2018  . Class 1 obesity due to excess calories without serious comorbidity with body mass index (BMI) of 30.0 to 30.9 in adult 11/15/2018  . Neutropenia  (HCC) 08/06/2018  . Major depression, recurrent, chronic (HCC) 08/06/2018  . GAD (generalized anxiety disorder) 08/06/2018  . Panic attack 01/22/2016  . Chronic allergic rhinitis 12/20/2015  . Asthma, moderate persistent, poorly-controlled 12/20/2015  . 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 LAPAROSCOPY  05/07/2017   Procedure: LAPAROSCOPY DIAGNOSTIC;  Surgeon: Feliberto GottronSchermerhorn, Ihor Austinhomas J, MD;  Location: [facility] ORS;  Service: Gynecology;;    Family History  Problem Relation Age of Onset  . Asthma Father   . Hypertension Father   . ADD / ADHD Brother     Social History   Socioeconomic History  . Marital status: Single    Spouse name: Not on file  . Number of children: 0  . Years of education: Not on file  . Highest education level: Some college, no degree  Occupational History  . Occupation: crew   Social Needs  . Financial resource strain: Very hard  . Food insecurity    Worry: Often true    Inability: Often true  . Transportation needs    Medical: Yes    Non-medical: Yes  Tobacco Use  . Smoking status: Current Every Day Smoker    Packs/day: 0.50    Years: 2.00    Pack years: 1.00    Start date: 03/01/2017  . Smokeless tobacco: Never Used  Substance and Sexual Activity  . Alcohol use:  No    Alcohol/week: 0.0 standard drinks  . Drug use: Yes    Frequency: 1.0 times per week    Types: Marijuana  . Sexual activity: Yes    Partners: Male    Birth control/protection: Condom    Comment: homosexual   Lifestyle  . Physical activity    Days per week: 2 days    Minutes per session: 30 min  . Stress: To some extent  Relationships  . Social connections    Talks on phone: More than three times a week    Gets together: More than three times a week    Attends religious service: Never    Active member of club or organization: No    Attends meetings of clubs or organizations: Never    Relationship status: Living with partner   . Intimate partner violence    Fear of current or ex partner: No    Emotionally abused: No    Physically abused: No    Forced sexual activity: No  Other Topics Concern  . Not on file  Social History Narrative   Relationship with parents is a little better     Current Outpatient Medications:  .  albuterol (PROVENTIL HFA;VENTOLIN HFA) 108 (90 Base) MCG/ACT inhaler, Inhale 2 puffs into the lungs every 6 (six) hours as needed for wheezing or shortness of breath., Disp: 1 Inhaler, Rfl: 1 .  ARIPiprazole (ABILIFY) 5 MG tablet, Take 1 tablet (5 mg total) by mouth daily., Disp: 30 tablet, Rfl: 0 .  budesonide-formoterol (SYMBICORT) 160-4.5 MCG/ACT inhaler, Inhale 2 puffs into the lungs 2 (two) times daily., Disp: 1 Inhaler, Rfl: 2 .  cetirizine (ZYRTEC) 10 MG tablet, Take 1 tablet (10 mg total) by mouth daily., Disp: 90 tablet, Rfl: 1 .  famotidine (PEPCID) 40 MG tablet, TAKE 1 TABLET(40 MG) BY MOUTH AT BEDTIME, Disp: 30 tablet, Rfl: 0 .  montelukast (SINGULAIR) 10 MG tablet, Take 1 tablet (10 mg total) by mouth at bedtime., Disp: 90 tablet, Rfl: 1 .  polyethylene glycol powder (GLYCOLAX/MIRALAX) 17 GM/SCOOP powder, Take 17 g by mouth daily., Disp: 3350 g, Rfl: 1 .  Prenatal Vit-Fe Fumarate-FA (PRENATAL MULTIVITAMIN) TABS tablet, Take 1 tablet by mouth daily at 12 noon., Disp: 100 tablet, Rfl: 1 .  sertraline (ZOLOFT) 50 MG tablet, TK 1 T PO QD, Disp: , Rfl:  .  Spacer/Aero-Holding Chambers (OPTICHAMBER [place]-LG MASK) DEVI, See admin instructions. use with inhaler, Disp: , Rfl: 0  Allergies  Allergen Reactions  . Mold Extract [Trichophyton] Other (See Comments)    Causes chest pain Per allergy test    I personally reviewed active problem list, medication list, allergies, family history, social history, health maintenance with the patient/caregiver today.   ROS  Constitutional: Negative for fever or weight change.  Respiratory: Positive  for cough but no  shortness of breath.    Cardiovascular: Negative for chest pain or palpitations.  Gastrointestinal: Negative for abdominal pain, no bowel changes.  Musculoskeletal: Negative for gait problem or joint swelling.  Skin: Negative for rash.  Neurological: Negative for dizziness or headache.  No other specific complaints in a complete review of systems (except as listed in HPI above).  Objective  Vitals:   05/13/19 1348  BP: 114/80  Pulse: 94  Resp: 16  Temp: 97.7 F (36.5 C)  TempSrc: Temporal  SpO2: 98%  Weight: 187 lb 11.2 oz (85.1 kg)  Height: 5\' 6"  (1.676 m)    Body mass index is 30.3 kg/m.  Physical  Exam  Constitutional: Patient appears well-developed and well-nourished. Obese No distress.  HEENT: head atraumatic, normocephalic, pupils equal and reactive to light, Cardiovascular: Normal rate, regular rhythm and normal heart sounds.  No murmur heard. No BLE edema. Pulmonary/Chest: Effort normal and breath sounds normal. No respiratory distress. Abdominal: Soft.  There is no tenderness. Psychiatric: Patient has a normal mood and affect. behavior is normal. Judgment and thought content normal.  Recent Results (from the past 2160 hour(s))  WET PREP FOR TRICH, YEAST, CLUE     Status: None   Collection Time: 03/02/19 12:00 AM  Result Value Ref Range   Trichomonas Exam Negative Negative   Yeast Exam Negative Negative   Clue Cell Exam Comment: Negative    Comment: POS;AMINE POS POS;AMINE POS   CBC     Status: Abnormal   Collection Time: 03/09/19 10:32 AM  Result Value Ref Range   WBC 5.6 4.0 - 10.5 K/uL   RBC 4.93 3.87 - 5.11 MIL/uL   Hemoglobin 13.5 12.0 - 15.0 g/dL   HCT 53.642.0 64.436.0 - 03.446.0 %   MCV 85.2 80.0 - 100.0 fL   MCH 27.4 26.0 - 34.0 pg   MCHC 32.1 30.0 - 36.0 g/dL   RDW 74.215.6 (H) 59.511.5 - 63.815.5 %   Platelets 392 150 - 400 K/uL   nRBC 0.0 0.0 - 0.2 %    Comment: Performed at [facility], [address] [place], [place] [address]  Comprehensive metabolic panel     Status:  Abnormal   Collection Time: 03/09/19 10:32 AM  Result Value Ref Range   Sodium 138 135 - 145 mmol/L   Potassium 4.2 3.5 - 5.1 mmol/L   Chloride 106 98 - 111 mmol/L   CO2 25 22 - 32 mmol/L   Glucose, Bld 86 70 - 99 mg/dL   BUN 9 6 - 20 mg/dL   Creatinine, Ser 3.290.82 0.44 - 1.00 mg/dL   Calcium 8.7 (L) 8.9 - 10.3 mg/dL   Total Protein 7.1 6.5 - 8.1 g/dL   Albumin 4.0 3.5 - 5.0 g/dL   AST 14 (L) 15 - 41 U/L   ALT 19 0 - 44 U/L   Alkaline Phosphatase 67 38 - 126 U/L   Total Bilirubin 0.7 0.3 - 1.2 mg/dL   GFR calc non Af Amer >60 >60 mL/min   GFR calc Af Amer >60 >60 mL/min   Anion gap 7 5 - 15    Comment: Performed at [facility], [address] [place], [place] [address]  hCG, quantitative, pregnancy     Status: None   Collection Time: 03/09/19 10:32 AM  Result Value Ref Range   hCG, Beta Chain, Quant, S <1 <5 mIU/mL    Comment:          GEST. AGE      CONC.  (mIU/mL)   <=1 WEEK        5 - 50     2 WEEKS       50 - 500     3 WEEKS       100 - 10,000     4 WEEKS     1,000 - 30,000     5 WEEKS     3,500 - 115,000   6-8 WEEKS     12,000 - 270,000    12 WEEKS     15,000 - 220,000        FEMALE AND NON-PREGNANT FEMALE:     LESS THAN 5  mIU/mL Performed at [facility], [address] [place], [place] [address]   Urinalysis, Complete w Microscopic     Status: Abnormal   Collection Time: 03/09/19 12:33 PM  Result Value Ref Range   Color, Urine YELLOW (A) YELLOW   APPearance HAZY (A) CLEAR   Specific Gravity, Urine 1.020 1.005 - 1.030   pH 7.0 5.0 - 8.0   Glucose, UA NEGATIVE NEGATIVE mg/dL   Hgb urine dipstick LARGE (A) NEGATIVE   Bilirubin Urine NEGATIVE NEGATIVE   Ketones, ur NEGATIVE NEGATIVE mg/dL   Protein, ur 30 (A) NEGATIVE mg/dL   Nitrite NEGATIVE NEGATIVE   Leukocytes,Ua SMALL (A) NEGATIVE   RBC / HPF >50 (H) 0 - 5 RBC/hpf   WBC, UA 11-20 0 - 5 WBC/hpf   Bacteria, UA NONE SEEN NONE SEEN   Squamous Epithelial / LPF 0-5 0 - 5   Mucus PRESENT      Comment: Performed at [facility], [address] [place], [place] [address]  Pregnancy, urine POC     Status: None   Collection Time: 03/09/19 12:51 PM  Result Value Ref Range   Preg Test, Ur NEGATIVE NEGATIVE    Comment:        THE SENSITIVITY OF THIS METHODOLOGY IS >24 mIU/mL   Novel Coronavirus,NAA,(SEND-OUT TO REF LAB - TAT 24-48 hrs); Hosp Order     Status: None   Collection Time: 03/09/19  1:45 PM   Specimen: Nasopharyngeal Swab; Respiratory  Result Value Ref Range   SARS-CoV-2, NAA NOT DETECTED NOT DETECTED    Comment: (NOTE) This test was developed and its performance characteristics determined by [employer]. This test has not been FDA cleared or approved. This test has been authorized by FDA under an Emergency Use Authorization (EUA). This test is only authorized for the duration of time the declaration that circumstances exist justifying the authorization of the emergency use of in vitro diagnostic tests for detection of SARS-CoV-2 virus and/or diagnosis of COVID-19 infection under section 564(b)(1) of the Act, 21 U.S.C. 671IWP-8(K)(9), unless the authorization is terminated or revoked sooner. When diagnostic testing is negative, the possibility of a false negative result should be considered in the context of a patient's recent exposures and the presence of clinical signs and symptoms consistent with COVID-19. An individual without symptoms of COVID-19 and who is not shedding SARS-CoV-2 virus would expect to have a negative (not detected) result in this assay. Performed  At: [facility] [place], [place] [id number] Rush Farmer MD LZ:[id number]    Coronavirus Source NASOPHARYNGEAL     Comment: Performed at [facility], [address] [place],  [address]     PHQ2/9: Depression screen [facility] 2/9 05/13/2019 04/05/2019 01/31/2019 12/20/2018 12/06/2018  Decreased Interest 1 0 3 2 2   Down, Depressed,  Hopeless 2 1 3 2 2   PHQ - 2 Score 3 1 6 4 4   Altered sleeping 2 0 2 3 2   Tired, decreased energy 2 3 2 2 3   Change in appetite 3 0 3 3 2   Feeling bad or failure about yourself  2 1 3 3 3   Trouble concentrating 2 1 3 3 3   Moving slowly or fidgety/restless 0 0 3 2 2   Suicidal thoughts 0 0 1 1 0  PHQ-9 Score 14 6 23 21 19   Difficult doing work/chores Somewhat difficult Not difficult at all Very difficult Very difficult Very difficult  Some recent data might be hidden    phq  9 is positive   Fall Risk: Fall Risk  05/13/2019 12/20/2018 12/06/2018 11/15/2018 09/03/2018  Falls in the past year? 0 0 0 0 0  Number falls in past yr: 0 0 0 0 0  Injury with Fall? 0 0 0 0 0  Follow up - - - Falls evaluation completed Falls evaluation completed     Functional Status Survey: Is the patient deaf or have difficulty hearing?: No Does the patient have difficulty seeing, even when wearing glasses/contacts?: No Does the patient have difficulty concentrating, remembering, or making decisions?: No Does the patient have difficulty walking or climbing stairs?: No Does the patient have difficulty dressing or bathing?: No Does the patient have difficulty doing errands alone such as visiting a doctor's office or shopping?: No   Assessment & Plan  1. Intermenstrual bleeding  Gave her reassurance for now and we will check TSH  2. Asthma, moderate persistent, poorly-controlled  - montelukast (SINGULAIR) 10 MG tablet; Take 1 tablet (10 mg total) by mouth at bedtime.  Dispense: 90 tablet; Refill: 1 - budesonide-formoterol (SYMBICORT) 160-4.5 MCG/ACT inhaler; Inhale 2 puffs into the lungs 2 (two) times daily.  Dispense: 1 Inhaler; Refill: 2  3. Mood disorder (HCC)  Contact [facility]

## 2019-05-13 NOTE — Telephone Encounter (Signed)
Requested medication (s) are due for refill today: yes  Requested medication (s) are on the active medication list: yes  Last refill:  12/20/2018  Future visit scheduled: yes   Notes to clinic: refill cannot be delegated    Requested Prescriptions  Pending Prescriptions Disp Refills   ondansetron (ZOFRAN) 4 MG tablet [Pharmacy Med Name: ONDANSETRON 4MG  TABLETS] 20 tablet 0    Sig: TAKE 1 TABLET(4 MG) BY MOUTH EVERY 8 HOURS AS NEEDED FOR NAUSEA OR VOMITING     Not Delegated - Gastroenterology: Antiemetics Failed - 05/13/2019  1:50 PM      Failed - This refill cannot be delegated      Passed - Valid encounter within last 6 months    Recent Outpatient Visits          Today    [facility] Steele Sizer, MD   1 month ago Prenatal care, antepartum   [facility] Steele Sizer, MD   3 months ago Asthma, moderate persistent, poorly-controlled   [facility], FNP   4 months ago Nausea and vomiting in adult   [facility] Steele Sizer, MD   5 months ago Well woman exam   [facility] Steele Sizer, MD      Future Appointments            In 2 weeks Hubbard Hartshorn, [facility], [facility]

## 2019-05-17 ENCOUNTER — Other Ambulatory Visit: Payer: Self-pay | Admitting: Family Medicine

## 2019-05-17 ENCOUNTER — Encounter: Payer: Self-pay | Admitting: Family Medicine

## 2019-05-17 DIAGNOSIS — N923 Ovulation bleeding: Secondary | ICD-10-CM

## 2019-05-23 ENCOUNTER — Telehealth: Payer: Self-pay | Admitting: Family Medicine

## 2019-05-23 NOTE — Telephone Encounter (Signed)
Pt stated that she has been having vaginal bleeding for 9 days and would like to know what should she do. She has been referred to ob/gyn but they can not see her until Oct 19. I did offer pt appt for tomorrow but she will not be able to take that appt because she will be leaving tomorrow for work. Please advise

## 2019-06-02 ENCOUNTER — Ambulatory Visit: Payer: No Typology Code available for payment source | Admitting: Family Medicine

## 2019-06-27 ENCOUNTER — Telehealth: Payer: Self-pay

## 2019-06-27 NOTE — Telephone Encounter (Signed)
Copied from [place] [id number]. Topic: Referral - Question >> Jun 27, 2019 10:03 AM Rayann Heman wrote: Reason for CRM: caitlin calling from [facility] states that she would like most recent OV notes faxed over to them. Please advise  [phone]  Information has been faxed as requested.

## 2019-07-22 ENCOUNTER — Encounter: Payer: Self-pay | Admitting: Family Medicine

## 2019-07-28 ENCOUNTER — Other Ambulatory Visit: Payer: Self-pay

## 2019-07-28 DIAGNOSIS — Z20822 Contact with and (suspected) exposure to covid-19: Secondary | ICD-10-CM

## 2019-07-31 LAB — NOVEL CORONAVIRUS, NAA: SARS-CoV-2, NAA: NOT DETECTED

## 2019-08-11 ENCOUNTER — Telehealth: Payer: Self-pay

## 2019-08-11 NOTE — Telephone Encounter (Signed)
Copied from [place] [id number]. Topic: General - Other >> Aug 11, 2019  9:22 AM Sheran Luz wrote: Patient would like to know if she is able to drop off paperwork for Dr. Ancil Boozer to sign. She states she was sent home from work due to a cough which patient states is from her asthma- Patient was recently tested for covid and results came back negative.   Tried to call patient and tell her it was ok to drop off paperwork. No answer. No vm.

## 2019-08-26 NOTE — L&D Delivery Note (Signed)
Delivery Note  First Stage: Labor onset: 1230 Induction: cytotec x 2 doses, Foley balloon, AROM, Pitocin  Analgesia /Anesthesia intrapartum: IVPM, Epidural AROM at 1230  Second Stage: Complete dilation at 1939 Onset of pushing at 1945 FHR second stage Cat II variables and earlies, 135-175bpm, afebrile.   Delivery of a viable female on 07/26/20 at 2027 by CNM delivery of fetal head in LOA position with restitution to LOT. No nuchal cord;  Anterior then posterior shoulders delivered easily with gentle downward traction. Baby placed on mom's chest, and attended to by peds.  Cord double clamped after cessation of pulsation, cut by FOB.   Third Stage: Placenta delivered spontaneously intact with 3VC @ 2031 Placenta disposition: to pathology Uterine tone firm / bleeding scant  2nd deg left vaginal laceration identified  Anesthesia for repair: epidural and local Repair 2-0 Vicryl CT-1 Est. Blood Loss (mL): 180  Complications: none  Mom to postpartum.  Baby to Couplet care / Skin to Skin.  Newborn: Birth Weight: pending  Apgar Scores: 8/9 Feeding planned: breastmilk and formula

## 2019-08-29 ENCOUNTER — Other Ambulatory Visit: Payer: Self-pay

## 2019-08-29 ENCOUNTER — Encounter: Payer: Self-pay | Admitting: Family Medicine

## 2019-08-29 ENCOUNTER — Ambulatory Visit (INDEPENDENT_AMBULATORY_CARE_PROVIDER_SITE_OTHER): Payer: No Typology Code available for payment source | Admitting: Family Medicine

## 2019-08-29 VITALS — HR 108 | Temp 97.9°F | Ht 66.0 in | Wt 180.0 lb

## 2019-08-29 DIAGNOSIS — Z20822 Contact with and (suspected) exposure to covid-19: Secondary | ICD-10-CM | POA: Diagnosis not present

## 2019-08-29 DIAGNOSIS — B349 Viral infection, unspecified: Secondary | ICD-10-CM

## 2019-08-29 DIAGNOSIS — Z7189 Other specified counseling: Secondary | ICD-10-CM

## 2019-08-29 DIAGNOSIS — R519 Headache, unspecified: Secondary | ICD-10-CM | POA: Diagnosis not present

## 2019-08-29 NOTE — Progress Notes (Signed)
Name: Grace [name]   MRN: [id number]    DOB: Mar 14, 2001   Date:08/29/2019       Progress Note  Subjective:    Chief Complaint  Chief Complaint  Patient presents with  . Migraine    onset 4 days, no other symptoms.  Heache since has went away.  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  Last tested on friday with negative result.  Work will not let her come back to work    I connected with  [name] on 08/29/19 at  2:00 PM EST by telephone and verified that I am speaking with the correct person using two identifiers.   I discussed the limitations, risks, security and privacy concerns of performing an evaluation and management service by telephone and the availability of in person appointments. Staff also discussed with the patient that there may be a patient responsible charge related to this service. Patient Location: home Provider Location: [facility] clinic Additional Individuals present: none  CC of HA, N, fatigue, wants work note to return to work stating its something other than COVID.  Pt had exposure to mother who is COVID positive Pt lives with her mom - her mom is currently doing isolation, pt states there has been less than 5 min in contact with her mother. Mom had sx of myalgias loss of taste and smell sx started 08/24/2019 and tested 08/25/2019 and tested positive.  Possible exposure with mother would have started 7 days ago.  Pt and mother are in the same house but she denies any close exposure due to "being gone a lot" and being in her room.  Quarantine recommendation per CDC for Covid positive contacts in same home are assuming high risk exposure which this patient likely is.  Today pt has HA this morning and didn't feel well and she called into work today for illness. Her last work test was Saturday (2 d ago) it was negative.  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  Per her work's policy she will not be allowed to RTW until 10  days after sx.  I asked about COVID exposure policy, and pt is adamant that she has not had exposure to her mother despite living in the same home.  HPI of headache as noted below, she had generalized malaise and did not feel well and called in sick as her initial and only symptoms.  Headache  This is a new problem. Episode onset: this morning. The problem has been resolved. The pain is located in the occipital region. The pain does not radiate. The pain quality is similar to prior headaches. The quality of the pain is described as dull. The pain is at a severity of 8/10. Associated symptoms include nausea. Pertinent negatives include no abdominal pain, abnormal behavior, anorexia, back pain, blurred vision, coughing, dizziness, drainage, ear pain, eye pain, eye redness, eye watering, fever, hearing loss, insomnia, loss of balance, muscle aches, neck pain, numbness, phonophobia, photophobia, rhinorrhea, scalp tenderness, sinus pressure, sore throat, swollen glands, tingling, tinnitus, visual change, vomiting, weakness or weight loss. Nothing aggravates the symptoms. She has tried nothing for the symptoms. There is no history of cluster headaches, hypertension, migraine headaches, migraines in the family, pseudotumor cerebri, recent head traumas, sinus disease or TMJ. (Tension headaches)      Patient Active Problem List   Diagnosis Date Noted  . BV (bacterial vaginosis) 03/02/2019  . Mood disorder (HCC) 11/15/2018  .  PTSD (post-traumatic stress disorder) 11/15/2018  . Class 1 obesity due to excess calories without serious comorbidity with body mass index (BMI) of 30.0 to 30.9 in adult 11/15/2018  . Neutropenia (HCC) 08/06/2018  . Major depression, recurrent, chronic (HCC) 08/06/2018  . GAD (generalized anxiety disorder) 08/06/2018  . Panic attack 01/22/2016  . Chronic allergic rhinitis 12/20/2015  . Asthma, moderate persistent, poorly-controlled 12/20/2015  . Primary dysmenorrhea 12/20/2015     Social History   Tobacco Use  . Smoking status: Current Every Day Smoker    Packs/day: 0.50    Years: 2.00    Pack years: 1.00    Start date: 03/01/2017  . Smokeless tobacco: Never Used  Substance Use Topics  . Alcohol use: No    Alcohol/week: 0.0 standard drinks     Current Outpatient Medications:  .  albuterol (PROVENTIL HFA;VENTOLIN HFA) 108 (90 Base) MCG/ACT inhaler, Inhale 2 puffs into the lungs every 6 (six) hours as needed for wheezing or shortness of breath., Disp: 1 Inhaler, Rfl: 1 .  ARIPiprazole (ABILIFY) 5 MG tablet, Take 1 tablet (5 mg total) by mouth daily., Disp: 30 tablet, Rfl: 0 .  budesonide-formoterol (SYMBICORT) 160-4.5 MCG/ACT inhaler, Inhale 2 puffs into the lungs 2 (two) times daily., Disp: 1 Inhaler, Rfl: 2 .  cetirizine (ZYRTEC) 10 MG tablet, Take 1 tablet (10 mg total) by mouth daily., Disp: 90 tablet, Rfl: 1 .  famotidine (PEPCID) 40 MG tablet, TAKE 1 TABLET(40 MG) BY MOUTH AT BEDTIME, Disp: 30 tablet, Rfl: 0 .  montelukast (SINGULAIR) 10 MG tablet, Take 1 tablet (10 mg total) by mouth at bedtime., Disp: 90 tablet, Rfl: 1 .  polyethylene glycol powder (GLYCOLAX/MIRALAX) 17 GM/SCOOP powder, Take 17 g by mouth daily., Disp: 3350 g, Rfl: 1 .  Prenatal Vit-Fe Fumarate-FA (PRENATAL MULTIVITAMIN) TABS tablet, Take 1 tablet by mouth daily at 12 noon., Disp: 100 tablet, Rfl: 1 .  sertraline (ZOLOFT) 50 MG tablet, TK 1 T PO QD, Disp: , Rfl:  .  Spacer/Aero-Holding Chambers (OPTICHAMBER [place]-LG MASK) DEVI, See admin instructions. use with inhaler, Disp: , Rfl: 0  Allergies  Allergen Reactions  . Mold Extract [Trichophyton] Other (See Comments)    Causes chest pain Per allergy test   I personally reviewed active problem list, medication list, allergies, family history, social history, health maintenance, notes from last encounter, lab results, imaging with the patient/caregiver today.   Review of Systems  Constitutional: Negative.  Negative for diaphoresis,  fever and weight loss.  HENT: Negative for ear pain, hearing loss, rhinorrhea, sinus pressure, sore throat and tinnitus.   Eyes: Negative.  Negative for blurred vision, photophobia, pain and redness.  Respiratory: Negative.  Negative for cough, shortness of breath and wheezing.   Cardiovascular: Negative.  Negative for chest pain and orthopnea.  Gastrointestinal: Positive for nausea. Negative for abdominal pain, anorexia and vomiting.  Genitourinary: Negative.   Musculoskeletal: Negative.  Negative for back pain and neck pain.  Skin: Negative.   Neurological: Positive for headaches. Negative for dizziness, tingling, weakness, numbness and loss of balance.  Psychiatric/Behavioral: Negative.  The patient does not have insomnia.   All other systems reviewed and are negative.    Objective:    Virtual encounter, vitals limited, only able to obtain the following Today's Vitals   08/29/19 1225  Pulse: (!) 108  Temp: 97.9 F (36.6 C)  SpO2: 95%  Weight: 180 lb (81.6 kg)  Height: 5\' 6"  (1.676 m)   Body mass index is 29.05 kg/m. Nursing Note  and Vital Signs reviewed.  Physical Exam Vitals and nursing note reviewed.  Constitutional:      General: She is not in acute distress.    Appearance: Normal appearance. She is well-developed. She is not ill-appearing, toxic-appearing or diaphoretic.  HENT:     Head: Normocephalic and atraumatic.  Eyes:     General:        Right eye: No discharge.        Left eye: No discharge.     Conjunctiva/sclera: Conjunctivae normal.  Neck:     Trachea: No tracheal deviation.  Cardiovascular:     Rate and Rhythm: Normal rate.  Pulmonary:     Effort: Pulmonary effort is normal. No respiratory distress.     Breath sounds: No stridor.  Skin:    Coloration: Skin is not jaundiced or pale.     Findings: No rash.  Neurological:     Mental Status: She is alert.  Psychiatric:        Mood and Affect: Mood normal.        Behavior: Behavior normal.      PE limited by telephone encounter  No results found for this or any previous visit (from the past 72 hour(s)).  Assessment and Plan:     ICD-10-CM   1. Nonspecific syndrome suggestive of viral illness  B34.9 COVID  2. Close exposure to COVID-19 virus  Z20.822 COVID  3. Acute nonintractable headache, unspecified headache type  R51.9   4. Advice given about COVID-19 virus infection  Z71.89   I have ordered retesting since her routine screening Covid testing was at work prior to the onset of her symptoms.  I also recommend that since patient works with high risk patients she should quarantine until [employer] test returns, and I do support a 10-day return to work window if symptoms have resolved there is been no fever and Covid test are negative.  Supportive and symptomatic treatment encouraged until then.  -Red flags and when to present for emergency care or RTC including fever >101.77F, chest pain, shortness of breath, new/worsening/un-resolving symptoms,  reviewed with patient at time of visit. Follow up and care instructions discussed and provided in AVS. - I discussed the assessment and treatment plan with the patient. The patient was provided an opportunity to ask questions and all were answered. The patient agreed with the plan and demonstrated an understanding of the instructions.  - The patient was advised to call back or seek an in-person evaluation if the symptoms worsen or if the condition fails to improve as anticipated.  I provided 18 minutes of non-face-to-face time during this encounter.  Delsa Grana, PA-C 08/29/19 1:49 PM

## 2019-08-30 ENCOUNTER — Ambulatory Visit: Payer: No Typology Code available for payment source | Attending: Internal Medicine

## 2019-08-30 DIAGNOSIS — Z20822 Contact with and (suspected) exposure to covid-19: Secondary | ICD-10-CM

## 2019-09-01 ENCOUNTER — Encounter: Payer: Self-pay | Admitting: Family Medicine

## 2019-09-01 LAB — NOVEL CORONAVIRUS, NAA: SARS-CoV-2, NAA: NOT DETECTED

## 2019-09-02 ENCOUNTER — Encounter: Payer: Self-pay | Admitting: Family Medicine

## 2019-09-02 ENCOUNTER — Other Ambulatory Visit (HOSPITAL_COMMUNITY)
Admission: RE | Admit: 2019-09-02 | Discharge: 2019-09-02 | Disposition: A | Discharge status: Home / Self Care | Payer: No Typology Code available for payment source | Source: Ambulatory Visit | Attending: Family Medicine | Admitting: Family Medicine

## 2019-09-02 ENCOUNTER — Other Ambulatory Visit: Payer: Self-pay

## 2019-09-02 ENCOUNTER — Ambulatory Visit (INDEPENDENT_AMBULATORY_CARE_PROVIDER_SITE_OTHER): Payer: No Typology Code available for payment source | Admitting: Family Medicine

## 2019-09-02 VITALS — BP 110/78 | HR 100 | Temp 97.8°F | Resp 16 | Ht 66.0 in | Wt 180.8 lb

## 2019-09-02 DIAGNOSIS — F4310 Post-traumatic stress disorder, unspecified: Secondary | ICD-10-CM | POA: Diagnosis not present

## 2019-09-02 DIAGNOSIS — J4540 Moderate persistent asthma, uncomplicated: Secondary | ICD-10-CM

## 2019-09-02 DIAGNOSIS — J309 Allergic rhinitis, unspecified: Secondary | ICD-10-CM

## 2019-09-02 DIAGNOSIS — Z20822 Contact with and (suspected) exposure to covid-19: Secondary | ICD-10-CM

## 2019-09-02 DIAGNOSIS — F411 Generalized anxiety disorder: Secondary | ICD-10-CM | POA: Diagnosis not present

## 2019-09-02 DIAGNOSIS — Z113 Encounter for screening for infections with a predominantly sexual mode of transmission: Secondary | ICD-10-CM

## 2019-09-02 LAB — OB RESULTS CONSOLE HIV ANTIBODY (ROUTINE TESTING): HIV: NONREACTIVE

## 2019-09-02 MED ORDER — ALBUTEROL SULFATE HFA 108 (90 BASE) MCG/ACT IN AERS: 2.0000 | INHALATION_SPRAY | Freq: Four times a day (QID) | RESPIRATORY_TRACT | 1 refills | Status: DC | PRN

## 2019-09-02 MED ORDER — BUDESONIDE-FORMOTEROL FUMARATE 160-4.5 MCG/ACT IN AERO: 2.0000 | INHALATION_SPRAY | Freq: Two times a day (BID) | RESPIRATORY_TRACT | 2 refills | Status: DC

## 2019-09-02 MED ORDER — CETIRIZINE HCL 10 MG PO TABS: 10.0000 mg | ORAL_TABLET | Freq: Every day | ORAL | 1 refills | Status: DC

## 2019-09-02 MED ORDER — MONTELUKAST SODIUM 10 MG PO TABS: 10.0000 mg | ORAL_TABLET | Freq: Every day | ORAL | 1 refills | Status: DC

## 2019-09-02 NOTE — Telephone Encounter (Signed)
Grace [name] saw her today for different reasons and sent her a note. thanks
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
## 2019-09-05 LAB — CERVICOVAGINAL ANCILLARY ONLY
Chlamydia: NEGATIVE
Comment: NEGATIVE
Comment: NORMAL
Neisseria Gonorrhea: POSITIVE — AB

## 2019-09-05 LAB — RPR: RPR Ser Ql: NONREACTIVE

## 2019-09-05 LAB — HIV ANTIBODY (ROUTINE TESTING W REFLEX): HIV 1&2 Ab, 4th Generation: NONREACTIVE

## 2019-09-06 ENCOUNTER — Other Ambulatory Visit: Payer: Self-pay

## 2019-09-06 ENCOUNTER — Ambulatory Visit (INDEPENDENT_AMBULATORY_CARE_PROVIDER_SITE_OTHER): Payer: No Typology Code available for payment source | Admitting: Family Medicine

## 2019-09-06 ENCOUNTER — Encounter: Payer: Self-pay | Admitting: Family Medicine

## 2019-09-06 DIAGNOSIS — Z3200 Encounter for pregnancy test, result unknown: Secondary | ICD-10-CM | POA: Diagnosis not present

## 2019-09-06 DIAGNOSIS — A549 Gonococcal infection, unspecified: Secondary | ICD-10-CM

## 2019-09-06 MED ORDER — CEFTRIAXONE SODIUM 1 G IJ SOLR
1.0000 g | Freq: Once | INTRAMUSCULAR | Status: AC
  Administered 2019-09-06: 500 g via INTRAMUSCULAR

## 2019-09-06 MED ORDER — CEFTRIAXONE SODIUM 500 MG IJ SOLR: 500.0000 mg | Freq: Once | INTRAMUSCULAR | Status: DC

## 2019-09-06 NOTE — Patient Instructions (Signed)
Gonorrhea Gonorrhea is a sexually transmitted disease (STD) that can affect both men and women. If left untreated, this infection can:  Damage the female or female organs.  Cause women and men to be unable to have children (be sterile).  Harm a fetus if an infected woman is pregnant. It is important to get treatment for gonorrhea as soon as possible. It is also necessary for all of your sexual partners to be tested for the infection. What are the causes? This condition is caused by bacteria called Neisseria gonorrhoeae. The infection is spread from person to person through sexual contact, including oral, anal, and vaginal sex. A newborn can contract the infection from his or her mother during birth. What increases the risk? The following factors may make you more likely to develop this condition:  Being a woman who is younger than 20 years of age and who is sexually active.  Being a woman 25 years of age or older who has: ? A new sex partner. ? More than one sex partner. ? A sex partner who has an STD.  Being a man who has: ? A new sex partner. ? More than one sex partner. ? A sex partner who has an STD.  Using condoms inconsistently.  Currently having, or having previously had, an STD.  Exchanging sex for money or drugs. What are the signs or symptoms? Some people do not have any symptoms. If you do have symptoms, they may be different for females and males. For females  Pain in the lower abdomen.  Abnormal vaginal discharge. The discharge may be cloudy, thick, or yellow-green in color.  Bleeding between periods.  Painful sex.  Burning or itching in and around the vagina.  Pain or burning when urinating.  Irritation, pain, bleeding, or discharge from the rectum. This may occur if the infection was spread by anal sex.  Sore throat or swollen lymph nodes in the neck. This may occur if the infection was spread by oral sex. For males  Abnormal discharge from the penis.  This discharge may be cloudy, thick, or yellow-green in color.  Pain or burning during urination.  Pain or swelling in the testicles.  Irritation, pain, bleeding, or discharge from the rectum. This may occur if the infection was spread by anal sex.  Sore throat, fever, or swollen lymph nodes in the neck. This may occur if the infection was spread by oral sex. How is this diagnosed? This condition is diagnosed based on:  A physical exam.  A sample of discharge that is examined under a microscope to look for the bacteria. The discharge may be taken from the urethra, cervix, throat, or rectum.  Urine tests. Not all of test results will be available during your visit. How is this treated? This condition is treated with antibiotic medicines. It is important for treatment to begin as soon as possible. Early treatment may prevent some problems from developing. Do not have sex during treatment. Avoid all types of sexual activity for 7 days after treatment is complete and until any sex partners have been treated. Follow these instructions at home:  Take over-the-counter and prescription medicines only as told by your health care provider.  Take your antibiotic medicine as told by your health care provider. Do not stop taking the antibiotic even if you start to feel better.  Do not have sex until at least 7 days after you and your partner(s) have finished treatment and your health care provider says it is okay.    It is your responsibility to get your test results. Ask your health care provider, or the department performing the test, when your results will be ready.  If you test positive for gonorrhea, inform your recent sexual partners. This includes any oral, anal, or vaginal sex partners. They need to be checked for gonorrhea even if they do not have symptoms. They may need treatment, even if they test negative for gonorrhea.  Keep all follow-up visits as told by your health care provider.  This is important. How is this prevented?   Use latex condoms correctly every time you have sexual intercourse.  Ask if your sexual partner has been tested for STDs and had negative results.  Avoid having multiple sexual partners. Contact a health care provider if:  You develop a bad reaction to the medicine you were prescribed. This may include: ? A rash. ? Nausea. ? Vomiting. ? Diarrhea.  Your symptoms do not get better after a few days of taking antibiotics.  Your symptoms get worse.  You develop new symptoms.  Your pain gets worse.  You have a fever.  You develop pain, itching, or discharge around the eyes. Get help right away if:  You feel dizzy or faint.  You have trouble breathing or have shortness of breath.  You develop an irregular heartbeat.  You have severe abdominal pain with or without shoulder pain.  You develop any bumps or sores (lesions) on your skin.  You develop warmth, redness, pain, or swelling around your joints, such as the knee. Summary  Gonorrhea is an STD that can affect both men and women.  This condition is caused by bacteria called Neisseria gonorrhoeae. The infection is spread from person to person, usually through sexual contact, including oral, anal, and vaginal sex.  Symptoms vary between males and females. Generally, they include abnormal discharge and burning during urination. Women may also experience painful sex, itching around the vagina, and bleeding between menstrual periods. Men may also experience swelling of the testicles.  This condition is treated with antibiotic medicines. Do not have sex until at least 7 days after completing antibiotic treatment.  If left untreated, gonorrhea can have serious side effects and complications. This information is not intended to replace advice given to you by your health care provider. Make sure you discuss any questions you have with your health care provider. Document Revised:  01/04/2019 Document Reviewed: 07/11/2016 Elsevier Patient Education  2020 [employer]  

## 2019-09-06 NOTE — Progress Notes (Signed)
Name: Grace [name]   MRN: [id number]    DOB: Aug 23, 2001   Date:09/06/2019       Progress Note  Subjective  Chief Complaint  Chief Complaint  Patient presents with   Follow-up    STD check    I connected with  [name] on 09/06/19 at  2:20 PM EST by telephone and verified that I am speaking with the correct person using two identifiers.   I discussed the limitations, risks, security and privacy concerns of performing an evaluation and management service by telephone and the availability of in person appointments. Staff also discussed with the patient that there may be a patient responsible charge related to this service. Patient Location: Car (At our office) Provider Location: Office Additional Individuals present: None  HPI  Pt presents for vaginal gonorrhea infection.  She denies any abdominal pain, vaginal discharge, vaginal bleeding/spotting, fevers/chills.  Discussed safe sexual practices, notifying partners from the last 6 months.  We will also submit paperwork to report to health department.  She has notified both partners today to seek treatment.   Possible Pregnancy: She notes recent faint positive home pregnancy test, due for menses today.  She does note having unprotected intercourse with two female partners in the last month.  She has been having some mild fatigue and mild bloating; no breast tenderness, nausea/vomiting.  She sees her psychiatrist today and will advise on possible pregnancy to adjust medications for safety of the fetus.  She will repeat urine pregnancy test and if remaining positive will call her OB/GYN for appt.  Strongly recommend prenatal vitamin which she will obtain OTC.   Patient Active Problem List   Diagnosis Date Noted   BV (bacterial vaginosis) 03/02/2019   Mood disorder (HCC) 11/15/2018   PTSD (post-traumatic stress disorder) 11/15/2018   Class 1 obesity due to excess calories without serious comorbidity with body mass index (BMI) of  30.0 to 30.9 in adult 11/15/2018   Neutropenia (HCC) 08/06/2018   Major depression, recurrent, chronic (HCC) 08/06/2018   GAD (generalized anxiety disorder) 08/06/2018   Panic attack 01/22/2016   Chronic allergic rhinitis 12/20/2015   Asthma, moderate persistent, poorly-controlled 12/20/2015   Primary dysmenorrhea 12/20/2015    Social History   Tobacco Use   Smoking status: Current Every Day Smoker    Packs/day: 0.50    Years: 2.00    Pack years: 1.00    Start date: 03/01/2017   Smokeless tobacco: Never Used  Substance Use Topics   Alcohol use: No    Alcohol/week: 0.0 standard drinks     Current Outpatient Medications:    albuterol (VENTOLIN HFA) 108 (90 Base) MCG/ACT inhaler, Inhale 2 puffs into the lungs every 6 (six) hours as needed for wheezing or shortness of breath., Disp: 18 g, Rfl: 1   ARIPiprazole (ABILIFY) 5 MG tablet, Take 1 tablet (5 mg total) by mouth daily., Disp: 30 tablet, Rfl: 0   budesonide-formoterol (SYMBICORT) 160-4.5 MCG/ACT inhaler, Inhale 2 puffs into the lungs 2 (two) times daily., Disp: 1 Inhaler, Rfl: 2   cetirizine (ZYRTEC) 10 MG tablet, Take 1 tablet (10 mg total) by mouth daily., Disp: 90 tablet, Rfl: 1   famotidine (PEPCID) 40 MG tablet, TAKE 1 TABLET(40 MG) BY MOUTH AT BEDTIME, Disp: 30 tablet, Rfl: 0   montelukast (SINGULAIR) 10 MG tablet, Take 1 tablet (10 mg total) by mouth at bedtime., Disp: 90 tablet, Rfl: 1   polyethylene glycol powder (GLYCOLAX/MIRALAX) 17 GM/SCOOP powder, Take 17 g by  mouth daily., Disp: 3350 g, Rfl: 1   Prenatal Vit-Fe Fumarate-FA (PRENATAL MULTIVITAMIN) TABS tablet, Take 1 tablet by mouth daily at 12 noon., Disp: 100 tablet, Rfl: 1   sertraline (ZOLOFT) 50 MG tablet, TK 1 T PO QD, Disp: , Rfl:    Spacer/Aero-Holding Chambers (OPTICHAMBER [place]-LG MASK) DEVI, See admin instructions. use with inhaler, Disp: , Rfl: 0  Allergies  Allergen Reactions   Mold Extract [Trichophyton] Other (See Comments)     Causes chest pain Per allergy test    I personally reviewed active problem list, medication list, allergies, health maintenance, notes from last encounter, lab results with the patient/caregiver today.  ROS  Ten systems reviewed and is negative except as mentioned in HPI  Objective  Virtual encounter, vitals not obtained.  There is no height or weight on file to calculate BMI.  Nursing Note and Vital Signs reviewed.  Physical Exam  Pulmonary/Chest: Effort normal. No respiratory distress. Speaking in complete sentences Neurological: Pt is alert and oriented to person, place, and time. Speech is normal Psychiatric: Patient has a normal mood and affect. behavior is normal. Judgment and thought content normal.  No results found for this or any previous visit (from the past 72 hour(s)).  Assessment & Plan  1. Gonorrhea - Abstinence for at least 7 days, repeat testing for cure in 4 weeks.  - cefTRIAXone (ROCEPHIN) injection 1 g - 500mg  given IM  2. Possible pregnancy  She sees her psychiatrist today and will advise on possible pregnancy to adjust medications for safety of the fetus.  She will repeat urine pregnancy test and if remaining positive will call her OB/GYN for appt.  Strongly recommend prenatal vitamin which she will obtain OTC.  - I discussed the assessment and treatment plan with the patient. The patient was provided an opportunity to ask questions and all were answered. The patient agreed with the plan and demonstrated an understanding of the instructions.  - The patient was advised to call back or seek an in-person evaluation if the symptoms worsen or if the condition fails to improve as anticipated.  I provided 11 minutes of non-face-to-face time during this encounter.  Hubbard Hartshorn, FNP

## 2019-09-13 ENCOUNTER — Encounter: Payer: Self-pay | Admitting: Family Medicine

## 2019-09-15 ENCOUNTER — Ambulatory Visit: Payer: No Typology Code available for payment source | Admitting: Family Medicine

## 2019-09-21 ENCOUNTER — Ambulatory Visit (INDEPENDENT_AMBULATORY_CARE_PROVIDER_SITE_OTHER): Payer: No Typology Code available for payment source

## 2019-09-21 ENCOUNTER — Other Ambulatory Visit: Payer: Self-pay

## 2019-09-21 DIAGNOSIS — Z5181 Encounter for therapeutic drug level monitoring: Secondary | ICD-10-CM | POA: Diagnosis not present

## 2019-09-21 DIAGNOSIS — Z23 Encounter for immunization: Secondary | ICD-10-CM

## 2019-09-21 LAB — POCT URINE PREGNANCY: Preg Test, Ur: NEGATIVE

## 2019-09-21 NOTE — Addendum Note (Signed)
Addended by: Davene Costain on: 09/21/2019 04:03 PM   Modules accepted: Orders

## 2019-09-28 ENCOUNTER — Telehealth: Payer: Self-pay | Admitting: Family Medicine

## 2019-09-28 NOTE — Telephone Encounter (Signed)
Pt called stating that her psychiatrist increased her zoloft. And is requesting to have a short supply sent in for her. Please advise .

## 2019-09-28 NOTE — Telephone Encounter (Signed)
Pt called in to ask if PCP would send in a Rx for sertraline (ZOLOFT) 100 MG tablet to a pharmacy in [place] for her? Pt says that she is unable to reach her psych provider to request refill.   Pt would like to discuss further.   Pharmacy: [address] [place], [place]   Phone: [phone]   CB: [phone]

## 2019-09-30 NOTE — Telephone Encounter (Signed)
Called [facility] they stated they didn't prescribed anything in January 2021 due to possible pregnancy. However, they received our negative pregnancy test and contacted patient and gave her 3 prescriptions on 09/28/2019. Patient states she picked them up.

## 2019-10-04 ENCOUNTER — Ambulatory Visit: Payer: No Typology Code available for payment source

## 2019-10-27 ENCOUNTER — Ambulatory Visit: Payer: No Typology Code available for payment source | Admitting: Family Medicine

## 2019-11-04 ENCOUNTER — Emergency Department: Payer: No Typology Code available for payment source

## 2019-11-04 ENCOUNTER — Other Ambulatory Visit: Payer: Self-pay

## 2019-11-04 ENCOUNTER — Emergency Department
Admission: EM | Admit: 2019-11-04 | Discharge: 2019-11-04 | Discharge status: Against Medical Advice | Payer: No Typology Code available for payment source | Attending: Emergency Medicine | Admitting: Emergency Medicine

## 2019-11-04 DIAGNOSIS — J45909 Unspecified asthma, uncomplicated: Secondary | ICD-10-CM | POA: Diagnosis not present

## 2019-11-04 DIAGNOSIS — F17210 Nicotine dependence, cigarettes, uncomplicated: Secondary | ICD-10-CM | POA: Diagnosis not present

## 2019-11-04 DIAGNOSIS — R102 Pelvic and perineal pain: Secondary | ICD-10-CM | POA: Diagnosis not present

## 2019-11-04 DIAGNOSIS — Z79899 Other long term (current) drug therapy: Secondary | ICD-10-CM | POA: Diagnosis not present

## 2019-11-04 LAB — URINALYSIS, ROUTINE W REFLEX MICROSCOPIC
Bilirubin Urine: NEGATIVE
Glucose, UA: NEGATIVE mg/dL
Hgb urine dipstick: NEGATIVE
Ketones, ur: NEGATIVE mg/dL
Leukocytes,Ua: NEGATIVE
Nitrite: NEGATIVE
Protein, ur: NEGATIVE mg/dL
Specific Gravity, Urine: 1.014 (ref 1.005–1.030)
pH: 6 (ref 5.0–8.0)

## 2019-11-04 LAB — CBC WITH DIFFERENTIAL/PLATELET
Abs Immature Granulocytes: 0.01 10*3/uL (ref 0.00–0.07)
Basophils Absolute: 0.1 10*3/uL (ref 0.0–0.1)
Basophils Relative: 2 %
Eosinophils Absolute: 0.1 10*3/uL (ref 0.0–0.5)
Eosinophils Relative: 1 %
HCT: 40.6 % (ref 36.0–46.0)
Hemoglobin: 12.9 g/dL (ref 12.0–15.0)
Immature Granulocytes: 0 %
Lymphocytes Relative: 36 %
Lymphs Abs: 1.7 10*3/uL (ref 0.7–4.0)
MCH: 27.8 pg (ref 26.0–34.0)
MCHC: 31.8 g/dL (ref 30.0–36.0)
MCV: 87.5 fL (ref 80.0–100.0)
Monocytes Absolute: 0.4 10*3/uL (ref 0.1–1.0)
Monocytes Relative: 8 %
Neutro Abs: 2.5 10*3/uL (ref 1.7–7.7)
Neutrophils Relative %: 53 %
Platelets: 352 10*3/uL (ref 150–400)
RBC: 4.64 MIL/uL (ref 3.87–5.11)
RDW: 14.4 % (ref 11.5–15.5)
WBC: 4.7 10*3/uL (ref 4.0–10.5)
nRBC: 0 % (ref 0.0–0.2)

## 2019-11-04 LAB — WET PREP, GENITAL
Sperm: NONE SEEN
Trich, Wet Prep: NONE SEEN
Yeast Wet Prep HPF POC: NONE SEEN

## 2019-11-04 LAB — COMPREHENSIVE METABOLIC PANEL
ALT: 11 U/L (ref 0–44)
AST: 22 U/L (ref 15–41)
Albumin: 4 g/dL (ref 3.5–5.0)
Alkaline Phosphatase: 63 U/L (ref 38–126)
Anion gap: 7 (ref 5–15)
BUN: 12 mg/dL (ref 6–20)
CO2: 24 mmol/L (ref 22–32)
Calcium: 8.7 mg/dL — ABNORMAL LOW (ref 8.9–10.3)
Chloride: 105 mmol/L (ref 98–111)
Creatinine, Ser: 0.84 mg/dL (ref 0.44–1.00)
GFR calc Af Amer: >60 mL/min (ref >60)
GFR calc non Af Amer: >60 mL/min (ref >60)
Glucose, Bld: 106 mg/dL — ABNORMAL HIGH (ref 70–99)
Potassium: 4.4 mmol/L (ref 3.5–5.1)
Sodium: 136 mmol/L (ref 135–145)
Total Bilirubin: 1.1 mg/dL (ref 0.3–1.2)
Total Protein: 7.6 g/dL (ref 6.5–8.1)

## 2019-11-04 LAB — LIPASE, BLOOD: Lipase: 22 U/L (ref 11–51)

## 2019-11-04 LAB — POCT PREGNANCY, URINE: Preg Test, Ur: NEGATIVE

## 2019-11-04 LAB — HCG, QUANTITATIVE, PREGNANCY: hCG, Beta Chain, Quant, S: <1 m[IU]/mL (ref <5)

## 2019-11-04 LAB — CHLAMYDIA/NGC RT PCR (ARMC ONLY)
Chlamydia Tr: NOT DETECTED
N gonorrhoeae: NOT DETECTED

## 2019-11-04 MED ORDER — KETOROLAC TROMETHAMINE 30 MG/ML IJ SOLN
15.0000 mg | Freq: Once | INTRAMUSCULAR | Status: AC
  Administered 2019-11-04: 15 mg via INTRAVENOUS
  Filled 2019-11-04: qty 1

## 2019-11-04 MED ORDER — ACETAMINOPHEN 500 MG PO TABS
1000.0000 mg | ORAL_TABLET | Freq: Once | ORAL | Status: AC
  Administered 2019-11-04: 10:00:00 1000 mg via ORAL
  Filled 2019-11-04: qty 2

## 2019-11-04 MED ORDER — DICYCLOMINE HCL 10 MG PO CAPS
10.0000 mg | ORAL_CAPSULE | Freq: Once | ORAL | Status: AC
  Administered 2019-11-04: 10 mg via ORAL
  Filled 2019-11-04: qty 1

## 2019-11-04 NOTE — Discharge Instructions (Addendum)
We recommended you stay for your blood hCG because if it is positive you will need a ultrasound to rule out ectopic pregnancy.  You preferred to leave before this was done.  You should return to the ER if develop worsening pain or you notice that this blood hCG is positive on MyChart.  You can take Tylenol 1 g every 8 hours and ibuprofen 600 every 6 hours with food to help with your discomfort.  You can follow-up for your STD testing to see if you need treatment.  Return to the ER if you develop worsening pain, fevers or any other concerns  Your x-ray does not show significant stool burden at this time.

## 2019-11-04 NOTE — ED Notes (Signed)
NAD noted at time of D/C. Pt denies questions or concerns. Pt ambulatory to the lobby at this time. This RN reinforced risks of leaving AMA with patient, pt states understanding at this time.

## 2019-11-04 NOTE — ED Notes (Signed)
This RN administered medications per order. Pt questioning regarding neg preg test, pt states took 2 at home preg tests several days ago with "a really faint line", this RN explained would mention to MD, states today was supposed to be first day of period. Pt states "no it's okay", pt does not want further testing (blood work). Pt also requesting to know how much longer she is going to be here after pelvic due to her being hungry, this RN explained procedure of pelvic, offered to see if patient could eat after pelvic, pt states "No I don't want anything from here". This RN instructed patient to remove clothes from waist down and this RN and MD would return to perform pelvic exam. Pt states understanding.

## 2019-11-04 NOTE — ED Provider Notes (Signed)
[facility] Emergency Department Provider Note  ____________________________________________   First MD Initiated Contact with Patient 11/04/19 [phone]     (approximate)  I have reviewed the triage vital signs and the nursing notes.   HISTORY  Chief Complaint Abdominal Pain    HPI Grace [name] is a 19 y.o. female with depression who comes in with lower abdominal pain for the past week.  Patient states that she has had some discomfort of her lower abdomen for over a week and a half.  The pain is mostly suprapubic in nature.  The pain comes and goes, moderate, not better with MiraLAX, nothing seems to make it worse.  Patient is supposed to start her menstruation today but has not yet.  Patient is sexually active with men.  Patient states that she was positive for gonorrhea back in January but was treated and now has a new partner and states that she does not feel like she can be positive again since she never had recurrent contact with the prior partner.  She denies any right lower quadrant pain, fevers.  Denies any burning when she pees.  States that she feels like she will have to use the bathroom and then will sit on the toilet for 10 to 15 minutes and then have 1 small stool come out.  Then like an hour later she will have that happen again.  States that she has history of constipation was supposed be on MiraLAX daily but stopped but has now restarted over the past few days.  Patient does report some increasing vaginal discharge but similar in color and smell.          Past Medical History:  Diagnosis Date  . Acne   . Allergy    ALLERGIC RHINITIS  . Anxiety   . Asthma   . Deliberate self-cutting   . Depression   . Fibrocystic disease of breast   . Head pain   . History of depression 12/20/2015  . History of self-harm   . Knee pain, right   . Primary dysmenorrhea   . Severe myopia of both eyes     Patient Active Problem List   Diagnosis Date Noted    . BV (bacterial vaginosis) 03/02/2019  . Mood disorder (HCC) 11/15/2018  . PTSD (post-traumatic stress disorder) 11/15/2018  . Class 1 obesity due to excess calories without serious comorbidity with body mass index (BMI) of 30.0 to 30.9 in adult 11/15/2018  . Neutropenia (HCC) 08/06/2018  . Major depression, recurrent, chronic (HCC) 08/06/2018  . GAD (generalized anxiety disorder) 08/06/2018  . Panic attack 01/22/2016  . Chronic allergic rhinitis 12/20/2015  . Asthma, moderate persistent, poorly-controlled 12/20/2015  . 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 LAPAROSCOPY  05/07/2017   Procedure: LAPAROSCOPY DIAGNOSTIC;  Surgeon: Feliberto Gottron Ihor Austin, MD;  Location: [facility] ORS;  Service: Gynecology;;    Prior to Admission medications   Medication Sig Start Date End Date Taking? Authorizing Provider  albuterol (VENTOLIN HFA) 108 (90 Base) MCG/ACT inhaler Inhale 2 puffs into the lungs every 6 (six) hours as needed for wheezing or shortness of breath. 09/02/19   Doren Custard, FNP  ARIPiprazole (ABILIFY) 5 MG tablet Take 1 tablet (5 mg total) by mouth daily. 09/03/18   Doren Custard, FNP  budesonide-formoterol (SYMBICORT) 160-4.5 MCG/ACT inhaler Inhale 2 puffs into the lungs 2 (two) times daily. 09/02/19   Doren Custard, FNP  cetirizine Harless Nakayama)  10 MG tablet Take 1 tablet (10 mg total) by mouth daily. 09/02/19   Doren Custard, FNP  famotidine (PEPCID) 40 MG tablet TAKE 1 TABLET(40 MG) BY MOUTH AT BEDTIME 04/24/19   Sowles, Danna Hefty, MD  montelukast (SINGULAIR) 10 MG tablet Take 1 tablet (10 mg total) by mouth at bedtime. 09/02/19   Doren Custard, FNP  polyethylene glycol powder (GLYCOLAX/MIRALAX) 17 GM/SCOOP powder Take 17 g by mouth daily. 12/20/18   Alba Cory, MD  Prenatal Vit-Fe Fumarate-FA (PRENATAL MULTIVITAMIN) TABS tablet Take 1 tablet by mouth daily at 12 noon. 04/05/19   Alba Cory, MD  sertraline (ZOLOFT) 50 MG tablet TK 1 T PO QD  03/14/19   [provider]  Spacer/Aero-Holding Chambers (OPTICHAMBER Jaliana-LG MASK) DEVI See admin instructions. use with inhaler 08/29/16   [provider]    Allergies Mold extract [trichophyton]  Family History  Problem Relation Age of Onset  . Asthma Father   . Hypertension Father   . ADD / ADHD Brother     Social History Social History   Tobacco Use  . Smoking status: Current Every Day Smoker    Packs/day: 0.50    Years: 2.00    Pack years: 1.00    Start date: 03/01/2017  . Smokeless tobacco: Never Used  Substance Use Topics  . Alcohol use: No    Alcohol/week: 0.0 standard drinks  . Drug use: Yes    Frequency: 1.0 times per week    Types: Marijuana      Review of Systems Constitutional: No fever/chills Eyes: No visual changes. ENT: No sore throat. Cardiovascular: Denies chest pain. Respiratory: Denies shortness of breath. Gastrointestinal: Positive lower abdominal pain, vomiting, constipation Genitourinary: Negative for dysuria. Musculoskeletal: Negative for back pain. Skin: Negative for rash. Neurological: Negative for headaches, focal weakness or numbness. All other ROS negative ____________________________________________   PHYSICAL EXAM:  VITAL SIGNS: ED Triage Vitals  Enc Vitals Group     BP 11/04/19 0805 125/68     Pulse Rate 11/04/19 0805 (!) 18     Resp 11/04/19 0805 20     Temp 11/04/19 0805 98.7 F (37.1 C)     Temp Source 11/04/19 0805 Oral     SpO2 11/04/19 0805 99 %     Weight 11/04/19 0802 174 lb (78.9 kg)     Height 11/04/19 0802 5\' 6"  (1.676 m)     Head Circumference --      Peak Flow --      Pain Score 11/04/19 0802 7     Pain Loc --      Pain Edu? --      Excl. in GC? --     Constitutional: Alert and oriented. Well appearing and in no acute distress. Eyes: Conjunctivae are normal. EOMI. Head: Atraumatic. Nose: No congestion/rhinnorhea. Mouth/Throat: Mucous membranes are moist.   Neck: No stridor.  Trachea Midline. FROM Cardiovascular: Normal rate, regular rhythm. Grossly normal heart sounds.  Good peripheral circulation. Respiratory: Normal respiratory effort.  No retractions. Lungs CTAB. Gastrointestinal: Soft and nontender although reports suprapubic tenderness.. No distention. No abdominal bruits.  Musculoskeletal: No lower extremity tenderness nor edema.  No joint effusions. Neurologic:  Normal speech and language. No gross focal neurologic deficits are appreciated.  Skin:  Skin is warm, dry and intact. No rash noted. Psychiatric: Mood and affect are normal. Speech and behavior are normal. GU: Deferred   ____________________________________________   LABS (all labs ordered are listed, but only abnormal results are displayed)  Labs  Reviewed  COMPREHENSIVE METABOLIC PANEL - Abnormal; Notable for the following components:      Result Value   Glucose, Bld 106 (*)    Calcium 8.7 (*)    All other components within normal limits  URINALYSIS, ROUTINE W REFLEX MICROSCOPIC - Abnormal; Notable for the following components:   Color, Urine YELLOW (*)    APPearance CLEAR (*)    All other components within normal limits  CHLAMYDIA/NGC RT PCR ([facility] ONLY)  WET PREP, GENITAL  CBC WITH DIFFERENTIAL/PLATELET  LIPASE, BLOOD  HCG, QUANTITATIVE, PREGNANCY  POC URINE PREG, ED  POCT PREGNANCY, URINE   ____________________________________________    RADIOLOGY Vela Prose, personally viewed and evaluated these images (plain radiographs) as part of my medical decision making, as well as reviewing the written report by the radiologist.  ED MD interpretation: No significant stool  Official radiology report(s): DG Abd Portable 2 Views  Result Date: 11/04/2019 CLINICAL DATA:  Lower abdominal pain for 2 weeks.  Constipation EXAM: PORTABLE ABDOMEN - 2 VIEW COMPARISON:  None. FINDINGS: No dilated large or small bowel. Moderate volume stool in the ascending colon. Small volume stool  in the rectum. No pathologic calcifications. Osseous abnormality. No organomegaly IMPRESSION: Essentially normal bowel-gas pattern. Electronically Signed   By: Genevive Bi M.D.   On: 11/04/2019 10:21    ____________________________________________   PROCEDURES  Procedure(s) performed (including Critical Care):  Procedures   ____________________________________________   INITIAL IMPRESSION / ASSESSMENT AND PLAN / ED COURSE  ZAMYA CULHANE was evaluated in Emergency Department on 11/04/2019 for the symptoms described in the history of present illness. She was evaluated in the context of the global COVID-19 pandemic, which necessitated consideration that the patient might be at risk for infection with the SARS-CoV-2 virus that causes COVID-19. Institutional protocols and algorithms that pertain to the evaluation of patients at risk for COVID-19 are in a state of rapid change based on information released by regulatory bodies including the CDC and federal and state organizations. These policies and algorithms were followed during the patient's care in the ED.     Patient is a well-appearing 19 year old with normal vital signs he comes in with lower abdominal pain for over a week not been better with MiraLAX.  Patient's abdomen is soft and nontender although she reports a little bit of tenderness suprapubically.  There is no rebound or guarding or signs of peritonitis.  I have low suspicion for appendicitis given no right lower quadrant tenderness.  This does not sound like ovarian torsion.  Will get pregnancy test given she was supposed to start her period today.  Will get labs to evaluate for pancreatitis, cholecystitis although her pain is not upper abdomen.  Will need to be do a repeat pelvic exam given her recent positive gonorrhea to make sure that she was effectively treated and to evaluate for PID.  Will need to get urine to evaluate for UTI.  Will get x-ray to see if there is a ton of  stool burden that would be amenable to enema.  Patient's labs are reassuring.  urin pregnancy test is negative.  UA without evidence of UTI.  Patient later told the nurse that she had concerns for positive pregnancy test at home.  She said that she saw a faint second line in both of her family members also thought it was positive.  I discussed with patient that we needed to add on a blood hCG to confirm that she was not pregnant because sometimes  the urine can be negative but the blood can be positive in with her lower abdominal pain that we need to rule out an ectopic pregnancy if it is positive.  Patient states that she no longer wants to stay in the ER.  She understands that if she has an ectopic pregnancy it could result in loss of her ovaries, fertility and death.  Patient understands these risks and has capacity to make this decisions.  She is able to repeat back this to me.  Patient not intoxicated or under the influence to not be able to make this medical decision.  Patient states that she will follow up on my chart with her results although I did discuss with patient that this can be life-threatening within hours to days.  Patient is willing to undergo the pelvic exam but does not want any prophylactic treatment.  Pelvic exam was done that shows mild amount of discharge with no cervical motion tenderness or adnexal tenderness.  Low suspicion for PID.  Patient states that she will follow up with these results on MyChart.  Patient is still requesting to go home and is willing to leave [place] at this time.  Patient states that her pain has gotten better.        ____________________________________________   FINAL CLINICAL IMPRESSION(S) / ED DIAGNOSES   Final diagnoses:  Suprapubic pain      MEDICATIONS GIVEN DURING THIS VISIT:  Medications  acetaminophen (TYLENOL) tablet 1,000 mg (1,000 mg Oral Given 11/04/19 0952)  ketorolac (TORADOL) 30 MG/ML injection 15 mg (15 mg  Intravenous Given 11/04/19 0952)  dicyclomine (BENTYL) capsule 10 mg (10 mg Oral Given 11/04/19 0539)     ED Discharge Orders    None       Note:  This document was prepared using Dragon voice recognition software and may include unintentional dictation errors.   Vanessa Latham, MD 11/04/19 1032

## 2019-11-04 NOTE — ED Triage Notes (Signed)
Pt c/o lower abd pain for the past week with "a lot of constipation" states the has taken miralax and pepto bismol with no relief.

## 2019-11-04 NOTE — ED Notes (Signed)
EDP at bedside at this time to perform pelvic exam, speaking with patient at this time regarding risks of leaving prior to prior to receiving results of blood work and results of pelvic exam. Pt states she is unsure if she wants pelvic exam due to not being able to have sex. Pt states she does not want prophylactic treatment at this time.  Pt in agreement for pelvic exam at this time

## 2019-11-04 NOTE — ED Notes (Signed)
EDP made aware of patient's statements regarding "faint lines" on home preg tests, see orders.

## 2019-11-14 ENCOUNTER — Ambulatory Visit: Payer: No Typology Code available for payment source

## 2019-11-22 NOTE — Progress Notes (Signed)
Patient ID: Grace [name], female    DOB: [birth year], 19 y.o.   MRN: [id number]  PCP: Steele Sizer, MD  Chief Complaint  Patient presents with  . bumps    near hole of vagina, no pain onset several months    Subjective:   Grace [name] is a 19 y.o. female, presents to clinic with CC of the following:  HPI  Bumps to perineal area for at least 6-7 months, easily scratched, no itching and no pain, pt has not noticed any change to size or amount. They really aren't bothering her other than she is self conscious about them.  She has tried vagisil but it only caused irritation.  No other tx tried no other past evaluation 2 female partners in the last year, she recently did STD testing and it was neg.  Her current partner does not have genital lesions, sores, warts.  Pt currently has no vaginal discharge, urinary sx, abd/pelvic pain.  No ulcers, itching, burning. Her ex from almost ayear ago had hx of gonorrhea and chlamydia they were both treated and had no issues after than She had gardisil shots.     Patient Active Problem List   Diagnosis Date Noted  . BV (bacterial vaginosis) 03/02/2019  . Mood disorder ([place]) 11/15/2018  . PTSD (post-traumatic stress disorder) 11/15/2018  . Class 1 obesity due to excess calories without serious comorbidity with body mass index (BMI) of 30.0 to 30.9 in adult 11/15/2018  . Neutropenia ([place]) 08/06/2018  . Major depression, recurrent, chronic ([place]) 08/06/2018  . GAD (generalized anxiety disorder) 08/06/2018  . Panic attack 01/22/2016  . Chronic allergic rhinitis 12/20/2015  . Asthma, moderate persistent, poorly-controlled 12/20/2015  . Primary dysmenorrhea 12/20/2015      Current Outpatient Medications:  .  albuterol (VENTOLIN HFA) 108 (90 Base) MCG/ACT inhaler, Inhale 2 puffs into the lungs every 6 (six) hours as needed for wheezing or shortness of breath., Disp: 18 g, Rfl: 1 .  ARIPiprazole (ABILIFY) 5 MG tablet, Take 1 tablet (5 mg total)  by mouth daily., Disp: 30 tablet, Rfl: 0 .  budesonide-formoterol (SYMBICORT) 160-4.5 MCG/ACT inhaler, Inhale 2 puffs into the lungs 2 (two) times daily., Disp: 1 Inhaler, Rfl: 2 .  cetirizine (ZYRTEC) 10 MG tablet, Take 1 tablet (10 mg total) by mouth daily., Disp: 90 tablet, Rfl: 1 .  famotidine (PEPCID) 40 MG tablet, TAKE 1 TABLET(40 MG) BY MOUTH AT BEDTIME, Disp: 30 tablet, Rfl: 0 .  hydrOXYzine (VISTARIL) 25 MG capsule, Take 25 mg by mouth every 8 (eight) hours as needed for anxiety., Disp: , Rfl:  .  montelukast (SINGULAIR) 10 MG tablet, Take 1 tablet (10 mg total) by mouth at bedtime., Disp: 90 tablet, Rfl: 1 .  polyethylene glycol powder (GLYCOLAX/MIRALAX) 17 GM/SCOOP powder, Take 17 g by mouth daily., Disp: 3350 g, Rfl: 1 .  Prenatal Vit-Fe Fumarate-FA (PRENATAL MULTIVITAMIN) TABS tablet, Take 1 tablet by mouth daily at 12 noon., Disp: 100 tablet, Rfl: 1 .  sertraline (ZOLOFT) 100 MG tablet, 100 mg. , Disp: , Rfl:  .  imiquimod (ALDARA) 5 % cream, Apply topically 3 (three) times a week., Disp: 12 each, Rfl: 0 .  Spacer/Aero-Holding Chambers (OPTICHAMBER Johnnetta-LG MASK) DEVI, See admin instructions. use with inhaler, Disp: , Rfl: 0   Allergies  Allergen Reactions  . Mold Extract [Trichophyton] Other (See Comments)    Causes chest pain Per allergy test     Family History  Problem Relation Age of Onset  .  Asthma Father   . Hypertension Father   . ADD / ADHD Brother      Social History   Socioeconomic History  . Marital status: Single    Spouse name: Not on file  . Number of children: 0  . Years of education: Not on file  . Highest education level: Some college, no degree  Occupational History  . Occupation: crew   Tobacco Use  . Smoking status: Current Every Day Smoker    Packs/day: 0.50    Years: 2.00    Pack years: 1.00    Start date: 03/01/2017  . Smokeless tobacco: Never Used  Substance and Sexual Activity  . Alcohol use: No    Alcohol/week: 0.0 standard  drinks  . Drug use: Yes    Frequency: 1.0 times per week    Types: Marijuana  . Sexual activity: Yes    Partners: Male    Birth control/protection: Condom    Comment: homosexual   Other Topics Concern  . Not on file  Social History Narrative   Relationship with parents is a little better   Social Determinants of Health   Financial Resource Strain:   . Difficulty of Paying Living Expenses:   Food Insecurity:   . Worried About Programme researcher, broadcasting/film/video in the Last Year:   . Barista in the Last Year:   Transportation Needs:   . Freight forwarder (Medical):   Marland Kitchen Lack of Transportation (Non-Medical):   Physical Activity: Insufficiently Active  . Days of Exercise per Week: 2 days  . Minutes of Exercise per Session: 30 min  Stress:   . Feeling of Stress :   Social Connections:   . Frequency of Communication with Friends and Family:   . Frequency of Social Gatherings with Friends and Family:   . Attends Religious Services:   . Active Member of Clubs or Organizations:   . Attends Banker Meetings:   Marland Kitchen Marital Status:   Intimate Partner Violence:   . Fear of Current or Ex-Partner:   . Emotionally Abused:   Marland Kitchen Physically Abused:   . Sexually Abused:     Chart Review Today: I personally reviewed active problem list, medication list, allergies, family history, social history, health maintenance, notes from last encounter, lab results, imaging with the patient/caregiver today.   Review of Systems 10 Systems reviewed and are negative for acute change except as noted in the HPI.     Objective:   Vitals:   11/23/19 0953  BP: 108/64  Pulse: 98  Resp: 14  Temp: (!) 97.5 F (36.4 C)  SpO2: 98%  Weight: 175 lb 11.2 oz (79.7 kg)  Height: 5\' 6"  (1.676 m)    Body mass index is 28.36 kg/m.  Physical Exam Vitals and nursing note reviewed.  Constitutional:      General: She is not in acute distress.    Appearance: Normal appearance. She is well-developed.  She is not ill-appearing, toxic-appearing or diaphoretic.  HENT:     Head: Normocephalic and atraumatic.     Nose: Nose normal.  Eyes:     General:        Right eye: No discharge.        Left eye: No discharge.     Conjunctiva/sclera: Conjunctivae normal.  Neck:     Trachea: No tracheal deviation.  Cardiovascular:     Rate and Rhythm: Normal rate and regular rhythm.  Pulmonary:     Effort: Pulmonary effort is  normal. No respiratory distress.     Breath sounds: No stridor.  Genitourinary:    Comments: Raised flesh colored scattered small lesions roughly 1-50mm in diameter, no erythema, no edema, no ulceration Musculoskeletal:        General: Normal range of motion.  Skin:    General: Skin is warm and dry.     Findings: No rash.  Neurological:     Mental Status: She is alert.     Motor: No abnormal muscle tone.     Coordination: Coordination normal.  Psychiatric:        Behavior: Behavior normal.      Results for orders placed or performed during the hospital encounter of 11/04/19  Chlamydia/NGC rt PCR ([facility] only)   Specimen: Cervical/Vaginal swab  Result Value Ref Range   Specimen source GC/Chlam ENDOCERVICAL    Chlamydia Tr NOT DETECTED NOT DETECTED   N gonorrhoeae NOT DETECTED NOT DETECTED  Wet prep, genital   Specimen: Cervical/Vaginal swab  Result Value Ref Range   Yeast Wet Prep HPF POC NONE SEEN NONE SEEN   Trich, Wet Prep NONE SEEN NONE SEEN   Clue Cells Wet Prep HPF POC PRESENT (A) NONE SEEN   WBC, Wet Prep HPF POC MODERATE (A) NONE SEEN   Sperm NONE SEEN   CBC with Differential  Result Value Ref Range   WBC 4.7 4.0 - 10.5 K/uL   RBC 4.64 3.87 - 5.11 MIL/uL   Hemoglobin 12.9 12.0 - 15.0 g/dL   HCT 40.8 14.4 - 81.8 %   MCV 87.5 80.0 - 100.0 fL   MCH 27.8 26.0 - 34.0 pg   MCHC 31.8 30.0 - 36.0 g/dL   RDW 56.3 14.9 - 70.2 %   Platelets 352 150 - 400 K/uL   nRBC 0.0 0.0 - 0.2 %   Neutrophils Relative % 53 %   Neutro Abs 2.5 1.7 - 7.7 K/uL   Lymphocytes  Relative 36 %   Lymphs Abs 1.7 0.7 - 4.0 K/uL   Monocytes Relative 8 %   Monocytes Absolute 0.4 0.1 - 1.0 K/uL   Eosinophils Relative 1 %   Eosinophils Absolute 0.1 0.0 - 0.5 K/uL   Basophils Relative 2 %   Basophils Absolute 0.1 0.0 - 0.1 K/uL   Immature Granulocytes 0 %   Abs Immature Granulocytes 0.01 0.00 - 0.07 K/uL  Comprehensive metabolic panel  Result Value Ref Range   Sodium 136 135 - 145 mmol/L   Potassium 4.4 3.5 - 5.1 mmol/L   Chloride 105 98 - 111 mmol/L   CO2 24 22 - 32 mmol/L   Glucose, Bld 106 (H) 70 - 99 mg/dL   BUN 12 6 - 20 mg/dL   Creatinine, Ser 6.37 0.44 - 1.00 mg/dL   Calcium 8.7 (L) 8.9 - 10.3 mg/dL   Total Protein 7.6 6.5 - 8.1 g/dL   Albumin 4.0 3.5 - 5.0 g/dL   AST 22 15 - 41 U/L   ALT 11 0 - 44 U/L   Alkaline Phosphatase 63 38 - 126 U/L   Total Bilirubin 1.1 0.3 - 1.2 mg/dL   GFR calc non Af Amer >60 >60 mL/min   GFR calc Af Amer >60 >60 mL/min   Anion gap 7 5 - 15  Lipase, blood  Result Value Ref Range   Lipase 22 11 - 51 U/L  Urinalysis, Routine w reflex microscopic  Result Value Ref Range   Color, Urine YELLOW (A) YELLOW   APPearance CLEAR (A) CLEAR  Specific Gravity, Urine 1.014 1.005 - 1.030   pH 6.0 5.0 - 8.0   Glucose, UA NEGATIVE NEGATIVE mg/dL   Hgb urine dipstick NEGATIVE NEGATIVE   Bilirubin Urine NEGATIVE NEGATIVE   Ketones, ur NEGATIVE NEGATIVE mg/dL   Protein, ur NEGATIVE NEGATIVE mg/dL   Nitrite NEGATIVE NEGATIVE   Leukocytes,Ua NEGATIVE NEGATIVE  hCG, quantitative, pregnancy  Result Value Ref Range   hCG, Beta Chain, Quant, S <1 <5 mIU/mL  Pregnancy, urine POC  Result Value Ref Range   Preg Test, Ur NEGATIVE NEGATIVE        Assessment & Plan:      ICD-10-CM   1. Genital lesion, female  N94.9 imiquimod (ALDARA) 5 % cream    Suspect pt may have genital warts - trial of aldara 3x week f/up in 2 months, did discuss that we could refer pt to OBGYN if not improving.      Danelle Berry, PA-C 11/23/19 10:16 PM

## 2019-11-23 ENCOUNTER — Encounter: Payer: Self-pay | Admitting: Family Medicine

## 2019-11-23 ENCOUNTER — Other Ambulatory Visit: Payer: Self-pay

## 2019-11-23 ENCOUNTER — Ambulatory Visit (INDEPENDENT_AMBULATORY_CARE_PROVIDER_SITE_OTHER): Payer: No Typology Code available for payment source | Admitting: Family Medicine

## 2019-11-23 VITALS — BP 108/64 | HR 98 | Temp 97.5°F | Resp 14 | Ht 66.0 in | Wt 175.7 lb

## 2019-11-23 DIAGNOSIS — N949 Unspecified condition associated with female genital organs and menstrual cycle: Secondary | ICD-10-CM

## 2019-11-23 MED ORDER — IMIQUIMOD 5 % EX CREA: TOPICAL_CREAM | CUTANEOUS | 0 refills | Status: DC

## 2019-11-23 NOTE — Patient Instructions (Signed)
Warts ° °Warts are small growths on the skin. They are common, and they are caused by a virus. Warts can be found on many parts of the body. A person may have one wart or many warts. Most warts will go away on their own with time, but this could take many months to a few years. Treatments may be done if needed. °What are the causes? °Warts are caused by a type of virus that is called HPV. °· This virus can spread from person to person through touching. °· Warts can also spread to other parts of the body when a person scratches a wart and then scratches normal skin. °What increases the risk? °You are more likely to get warts if: °· You are 10-20 years old. °· You have a weak body defense system (immune system). °· You are Caucasian. °What are the signs or symptoms? °The main symptom of this condition is small growths on the skin. Warts may: °· Be round, oval, or have an uneven shape. °· Feel rough to the touch. °· Be the color of your skin or light yellow, brown, or gray. °· Often be less than ½ inch (1.3 cm) in size. °· Go away and then come back again. °Most warts do not hurt, but some can hurt if they are large or if they are on the bottom of your feet. °How is this diagnosed? °A wart can often be diagnosed by how it looks. In some cases, the doctor might remove a little bit of the wart to test it (biopsy). °How is this treated? °Most of the time, warts do not need treatment. Sometimes people want warts removed. If treatment is needed or wanted, options may include: °· Putting creams or patches with medicine in them on the wart. °· Putting duct tape over the top of the wart. °· Freezing the wart. °· Burning the wart with: °? A laser. °? An electric probe. °· Giving a shot of medicine into the wart to help the body's defense system fight off the wart. °· Surgery to remove the wart. °Follow these instructions at home: ° °Medicines °· Apply over-the-counter and prescription medicines only as told by your  doctor. °· Do not apply over-the-counter wart medicines to your face or genitals before you ask your doctor if it is okay to do that. °Lifestyle °· Keep your body's defense system healthy. To do this: °? Eat a healthy diet. °? Get enough sleep. °? Do not use any products that contain nicotine or tobacco, such as cigarettes and e-cigarettes. If you need help quitting, ask your doctor. °General instructions °· Wash your hands after you touch a wart. °· Do not scratch or pick at a wart. °· Avoid shaving hair that is over a wart. °· Keep all follow-up visits as told by your doctor. This is important. °Contact a doctor if: °· Your warts do not get better after treatment. °· You have redness, swelling, or pain at the site of a wart. °· You have bleeding from a wart, and the bleeding does not stop when you put light pressure on the wart. °· You have diabetes and you get a wart. °Summary °· Warts are small growths on the skin. They are common, and they are caused by a virus. °· Most of the time, warts do not need treatment. Sometimes people want warts removed. If treatment is needed or wanted, there are many options. °· Apply over-the-counter and prescription medicines only as told by your doctor. °· Wash   your hands after you touch a wart. °· Keep all follow-up visits as told by your doctor. This is important. °This information is not intended to replace advice given to you by your health care provider. Make sure you discuss any questions you have with your health care provider. °Document Revised: 12/29/2017 Document Reviewed: 12/29/2017 °Elsevier Patient Education © 2020 [employer] Inc. ° °

## 2019-11-25 ENCOUNTER — Telehealth: Payer: Self-pay | Admitting: Family Medicine

## 2019-11-25 ENCOUNTER — Ambulatory Visit: Payer: Self-pay | Admitting: *Deleted

## 2019-11-25 ENCOUNTER — Ambulatory Visit: Payer: No Typology Code available for payment source | Attending: Internal Medicine

## 2019-11-25 DIAGNOSIS — Z23 Encounter for immunization: Secondary | ICD-10-CM

## 2019-11-25 NOTE — Progress Notes (Signed)
   Covid-19 Vaccination Clinic  Name:  Grace [name]    MRN: [id number] DOB: 06-14-2001  11/25/2019  [name] was observed post Covid-19 immunization for 15 minutes without incident. She was provided with Vaccine Information Sheet and instruction to access the V-Safe system.   [name] was instructed to call 911 with any severe reactions post vaccine: Marland Kitchen Difficulty breathing  . Swelling of face and throat  . A fast heartbeat  . A bad rash all over body  . Dizziness and weakness   Immunizations Administered    Name Date Dose VIS Date Route   Pfizer COVID-19 Vaccine 11/25/2019 11:53 AM 0.3 mL 08/05/2019 Intramuscular   Manufacturer: [employer], [employer]   Lot: [id number]   NDC: [address]-8

## 2019-11-25 NOTE — Telephone Encounter (Signed)
FYI Pt called stating she wanted to know if she should get the Covid vaccine? Pt stated she took a pregnancy test and it was positive. Pt was on hold and I explained to her the nurse would call her back. Pt stated she was going to go ahead and get the vaccine because she has to for her schooling.

## 2019-11-25 NOTE — Telephone Encounter (Signed)
See previous nurse triage note dated 11/25/19.

## 2019-11-25 NOTE — Telephone Encounter (Signed)
Per initial encounter, "Patient would like to speak with nurse in regards to getting covid vaccine and pregnant"; contacted pt and the pt states she has a positive pregnancy test; pt notified per the CDC, "Getting vaccinated is a personal choice. Any of the currently authorized COVID-19 vaccines can be offered to people who are pregnant or breastfeeding. 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 Reason for Disposition . [1] Caller requesting NON-URGENT health information AND [2] PCP's office is the best resource  Answer Assessment - Initial Assessment Questions 1. REASON FOR CALL or QUESTION: "What is your reason for calling today?" or "How can I best help you?" or "What question do you have that I can help answer?"     COVID vaccine and pregnancy  Protocols used: INFORMATION ONLY CALL - NO TRIAGE-A-AH

## 2019-11-27 ENCOUNTER — Encounter: Payer: Self-pay | Admitting: *Deleted

## 2019-11-27 ENCOUNTER — Other Ambulatory Visit: Payer: Self-pay

## 2019-11-27 DIAGNOSIS — O99891 Other specified diseases and conditions complicating pregnancy: Secondary | ICD-10-CM | POA: Diagnosis present

## 2019-11-27 DIAGNOSIS — N760 Acute vaginitis: Secondary | ICD-10-CM | POA: Insufficient documentation

## 2019-11-27 DIAGNOSIS — Z79899 Other long term (current) drug therapy: Secondary | ICD-10-CM | POA: Diagnosis not present

## 2019-11-27 DIAGNOSIS — J45909 Unspecified asthma, uncomplicated: Secondary | ICD-10-CM | POA: Diagnosis not present

## 2019-11-27 DIAGNOSIS — R102 Pelvic and perineal pain: Secondary | ICD-10-CM | POA: Diagnosis not present

## 2019-11-27 DIAGNOSIS — O99511 Diseases of the respiratory system complicating pregnancy, first trimester: Secondary | ICD-10-CM | POA: Insufficient documentation

## 2019-11-27 DIAGNOSIS — Z3A00 Weeks of gestation of pregnancy not specified: Secondary | ICD-10-CM | POA: Insufficient documentation

## 2019-11-27 DIAGNOSIS — Z87891 Personal history of nicotine dependence: Secondary | ICD-10-CM | POA: Diagnosis not present

## 2019-11-27 LAB — URINALYSIS, ROUTINE W REFLEX MICROSCOPIC
Bilirubin Urine: NEGATIVE
Glucose, UA: NEGATIVE mg/dL
Hgb urine dipstick: NEGATIVE
Ketones, ur: NEGATIVE mg/dL
Leukocytes,Ua: NEGATIVE
Nitrite: NEGATIVE
Protein, ur: NEGATIVE mg/dL
Specific Gravity, Urine: 1.014 (ref 1.005–1.030)
pH: 7 (ref 5.0–8.0)

## 2019-11-27 NOTE — ED Triage Notes (Signed)
Pt c/o pelvic pain starting today. Pt denies dysuria, last normal BM today. Pt c/o vaginal discharge that is not abnormal per her report. Scant white discharge. Pt denies other sxs of STD/STI. Pt denies new sexual partner. Pt states recent STD check that was negative. Pt states positive pregnancy test at home. Pt states she is due for her cycle in 6 days. Pt does not use birth control.

## 2019-11-28 ENCOUNTER — Emergency Department
Admission: EM | Admit: 2019-11-28 | Discharge: 2019-11-28 | Disposition: A | Discharge status: Home / Self Care | Payer: Medicaid Other | Attending: Emergency Medicine | Admitting: Emergency Medicine

## 2019-11-28 ENCOUNTER — Encounter: Payer: Self-pay | Admitting: Family Medicine

## 2019-11-28 ENCOUNTER — Ambulatory Visit (INDEPENDENT_AMBULATORY_CARE_PROVIDER_SITE_OTHER): Payer: No Typology Code available for payment source | Admitting: Family Medicine

## 2019-11-28 ENCOUNTER — Emergency Department: Payer: Medicaid Other

## 2019-11-28 VITALS — BP 122/62 | HR 99 | Temp 98.3°F | Resp 14 | Ht 65.0 in | Wt 178.3 lb

## 2019-11-28 DIAGNOSIS — B9689 Other specified bacterial agents as the cause of diseases classified elsewhere: Secondary | ICD-10-CM

## 2019-11-28 DIAGNOSIS — R102 Pelvic and perineal pain: Secondary | ICD-10-CM

## 2019-11-28 DIAGNOSIS — O219 Vomiting of pregnancy, unspecified: Secondary | ICD-10-CM | POA: Diagnosis not present

## 2019-11-28 DIAGNOSIS — N760 Acute vaginitis: Secondary | ICD-10-CM

## 2019-11-28 DIAGNOSIS — Z3491 Encounter for supervision of normal pregnancy, unspecified, first trimester: Secondary | ICD-10-CM

## 2019-11-28 DIAGNOSIS — Z3201 Encounter for pregnancy test, result positive: Secondary | ICD-10-CM | POA: Diagnosis not present

## 2019-11-28 LAB — CBC
HCT: 38.2 % (ref 36.0–46.0)
Hemoglobin: 12.5 g/dL (ref 12.0–15.0)
MCH: 28.2 pg (ref 26.0–34.0)
MCHC: 32.7 g/dL (ref 30.0–36.0)
MCV: 86 fL (ref 80.0–100.0)
Platelets: 326 10*3/uL (ref 150–400)
RBC: 4.44 MIL/uL (ref 3.87–5.11)
RDW: 14.5 % (ref 11.5–15.5)
WBC: 7.9 10*3/uL (ref 4.0–10.5)
nRBC: 0 % (ref 0.0–0.2)

## 2019-11-28 LAB — BASIC METABOLIC PANEL
Anion gap: 7 (ref 5–15)
BUN: 15 mg/dL (ref 6–20)
CO2: 22 mmol/L (ref 22–32)
Calcium: 9.2 mg/dL (ref 8.9–10.3)
Chloride: 108 mmol/L (ref 98–111)
Creatinine, Ser: 0.72 mg/dL (ref 0.44–1.00)
GFR calc Af Amer: >60 mL/min (ref >60)
GFR calc non Af Amer: >60 mL/min (ref >60)
Glucose, Bld: 95 mg/dL (ref 70–99)
Potassium: 4 mmol/L (ref 3.5–5.1)
Sodium: 137 mmol/L (ref 135–145)

## 2019-11-28 LAB — HCG, QUANTITATIVE, PREGNANCY: hCG, Beta Chain, Quant, S: 58 m[IU]/mL — ABNORMAL HIGH (ref <5)

## 2019-11-28 LAB — WET PREP, GENITAL
Sperm: NONE SEEN
Trich, Wet Prep: NONE SEEN
Yeast Wet Prep HPF POC: NONE SEEN

## 2019-11-28 LAB — CHLAMYDIA/NGC RT PCR (ARMC ONLY)
Chlamydia Tr: NOT DETECTED
N gonorrhoeae: NOT DETECTED

## 2019-11-28 LAB — POCT PREGNANCY, URINE: Preg Test, Ur: NEGATIVE

## 2019-11-28 MED ORDER — METRONIDAZOLE 500 MG PO TABS: 500.0000 mg | ORAL_TABLET | Freq: Two times a day (BID) | ORAL | 0 refills | Status: AC

## 2019-11-28 MED ORDER — DOXYLAMINE-PYRIDOXINE 10-10 MG PO TBEC: 1.0000 | DELAYED_RELEASE_TABLET | Freq: Every day | ORAL | 2 refills | Status: DC

## 2019-11-28 NOTE — Progress Notes (Signed)
Patient ID: Grace [name], female    DOB: Feb 11, 2001, 19 y.o.   MRN: [id number]  PCP: Alba Cory, MD  Chief Complaint  Patient presents with  . positive pregnacy test    positive bloodwork Done at ER yesterday  . Referral    needed by medicaid    Subjective:   Grace [name] is a 19 y.o. female, presents to clinic with CC of the following:  Pt here for referral to OBGYN prefers to go to [place] [facility], she had positive pregnancy test last night in the ER 11/05/2019.    Miscarriage back in sept?  She is not sure but she believes it was a early pregnancy or miscarriage Patient has history of bipolar she did discuss with the ER provider her medications, reviewed them today.  Patient states that she has stopped abilfy and zoloft, did review pregnancy category with Zoloft with her she can continue this medication if she needs to for mood and can decide this in consultation with OB/GYN.  Patient has no cramping vaginal spotting bleeding she does have some nausea and has vomited a few times has not tried anything over-the-counter.  Was not given anything in the ER    Patient Active Problem List   Diagnosis Date Noted  . BV (bacterial vaginosis) 03/02/2019  . Mood disorder (HCC) 11/15/2018  . PTSD (post-traumatic stress disorder) 11/15/2018  . Class 1 obesity due to excess calories without serious comorbidity with body mass index (BMI) of 30.0 to 30.9 in adult 11/15/2018  . Neutropenia (HCC) 08/06/2018  . Major depression, recurrent, chronic (HCC) 08/06/2018  . GAD (generalized anxiety disorder) 08/06/2018  . Panic attack 01/22/2016  . Chronic allergic rhinitis 12/20/2015  . Asthma, moderate persistent, poorly-controlled 12/20/2015  . Primary dysmenorrhea 12/20/2015      Current Outpatient Medications:  .  albuterol (VENTOLIN HFA) 108 (90 Base) MCG/ACT inhaler, Inhale 2 puffs into the lungs every 6 (six) hours as needed for wheezing or shortness of breath., Disp: 18 g,  Rfl: 1 .  cetirizine (ZYRTEC) 10 MG tablet, Take 1 tablet (10 mg total) by mouth daily., Disp: 90 tablet, Rfl: 1 .  famotidine (PEPCID) 40 MG tablet, TAKE 1 TABLET(40 MG) BY MOUTH AT BEDTIME, Disp: 30 tablet, Rfl: 0 .  imiquimod (ALDARA) 5 % cream, Apply topically 3 (three) times a week., Disp: 12 each, Rfl: 0 .  metroNIDAZOLE (FLAGYL) 500 MG tablet, Take 1 tablet (500 mg total) by mouth 2 (two) times daily for 7 days., Disp: 14 tablet, Rfl: 0 .  montelukast (SINGULAIR) 10 MG tablet, Take 1 tablet (10 mg total) by mouth at bedtime., Disp: 90 tablet, Rfl: 1 .  polyethylene glycol powder (GLYCOLAX/MIRALAX) 17 GM/SCOOP powder, Take 17 g by mouth daily., Disp: 3350 g, Rfl: 1 .  Prenatal Vit-Fe Fumarate-FA (PRENATAL MULTIVITAMIN) TABS tablet, Take 1 tablet by mouth daily at 12 noon., Disp: 100 tablet, Rfl: 1 .  ARIPiprazole (ABILIFY) 5 MG tablet, Take 1 tablet (5 mg total) by mouth daily. (Patient not taking: Reported on 11/28/2019), Disp: 30 tablet, Rfl: 0 .  budesonide-formoterol (SYMBICORT) 160-4.5 MCG/ACT inhaler, Inhale 2 puffs into the lungs 2 (two) times daily. (Patient not taking: Reported on 11/28/2019), Disp: 1 Inhaler, Rfl: 2 .  hydrOXYzine (VISTARIL) 25 MG capsule, Take 25 mg by mouth every 8 (eight) hours as needed for anxiety., Disp: , Rfl:  .  sertraline (ZOLOFT) 100 MG tablet, 100 mg. , Disp: , Rfl:  .  Spacer/Aero-Holding Deretha [facility]  MASK) DEVI, See admin instructions. use with inhaler, Disp: , Rfl: 0   Allergies  Allergen Reactions  . Mold Extract [Trichophyton] Other (See Comments)    Causes chest pain Per allergy test     Family History  Problem Relation Age of Onset  . Asthma Father   . Hypertension Father   . ADD / ADHD Brother      Social History   Socioeconomic History  . Marital status: Single    Spouse name: Not on file  . Number of children: 0  . Years of education: Not on file  . Highest education level: Some college, no degree    Occupational History  . Occupation: crew   Tobacco Use  . Smoking status: Former Smoker    Packs/day: 0.50    Years: 2.00    Pack years: 1.00    Start date: 03/01/2017  . Smokeless tobacco: Never Used  Substance and Sexual Activity  . Alcohol use: No    Alcohol/week: 0.0 standard drinks  . Drug use: Not Currently    Frequency: 1.0 times per week    Types: Marijuana    Comment: presently denies  . Sexual activity: Yes    Partners: Male    Birth control/protection: Condom, None    Comment: homosexual   Other Topics Concern  . Not on file  Social History Narrative   Relationship with parents is a little better   Social Determinants of Health   Financial Resource Strain:   . Difficulty of Paying Living Expenses:   Food Insecurity:   . Worried About Programme researcher, broadcasting/film/video in the Last Year:   . Barista in the Last Year:   Transportation Needs:   . Freight forwarder (Medical):   Marland Kitchen Lack of Transportation (Non-Medical):   Physical Activity: Insufficiently Active  . Days of Exercise per Week: 2 days  . Minutes of Exercise per Session: 30 min  Stress:   . Feeling of Stress :   Social Connections:   . Frequency of Communication with Friends and Family:   . Frequency of Social Gatherings with Friends and Family:   . Attends Religious Services:   . Active Member of Clubs or Organizations:   . Attends Banker Meetings:   Marland Kitchen Marital Status:   Intimate Partner Violence:   . Fear of Current or Ex-Partner:   . Emotionally Abused:   Marland Kitchen Physically Abused:   . Sexually Abused:     Chart Review Today: I personally reviewed active problem list, medication list, allergies, family history, social history, health maintenance, notes from last encounter, lab results, imaging with the patient/caregiver today.   Review of Systems 10 Systems reviewed and are negative for acute change except as noted in the HPI.     Objective:   Vitals:   11/28/19 1419  BP:  122/62  Pulse: 99  Resp: 14  Temp: 98.3 F (36.8 C)  SpO2: 99%  Weight: 178 lb 4.8 oz (80.9 kg)  Height: 5\' 5"  (1.651 m)    Body mass index is 29.67 kg/m.  Physical Exam Vitals and nursing note reviewed.  Constitutional:      General: She is not in acute distress.    Appearance: Normal appearance. She is well-developed. She is not ill-appearing, toxic-appearing or diaphoretic.     Interventions: Face mask in place.  HENT:     Head: Normocephalic and atraumatic.     Right Ear: External ear normal.  Left Ear: External ear normal.  Eyes:     General: Lids are normal. No scleral icterus.       Right eye: No discharge.        Left eye: No discharge.     Conjunctiva/sclera: Conjunctivae normal.  Neck:     Trachea: Phonation normal. No tracheal deviation.  Cardiovascular:     Rate and Rhythm: Normal rate and regular rhythm.     Pulses: Normal pulses.          Radial pulses are 2+ on the right side and 2+ on the left side.       Posterior tibial pulses are 2+ on the right side and 2+ on the left side.     Heart sounds: Normal heart sounds. No murmur. No friction rub. No gallop.   Pulmonary:     Effort: Pulmonary effort is normal. No respiratory distress.     Breath sounds: Normal breath sounds. No stridor. No wheezing, rhonchi or rales.  Chest:     Chest wall: No tenderness.  Abdominal:     General: Bowel sounds are normal. There is no distension.     Palpations: Abdomen is soft.     Tenderness: There is no abdominal tenderness. There is no right CVA tenderness, left CVA tenderness, guarding or rebound.  Musculoskeletal:        General: No deformity. Normal range of motion.     Cervical back: Normal range of motion and neck supple.     Right lower leg: No edema.     Left lower leg: No edema.  Lymphadenopathy:     Cervical: No cervical adenopathy.  Skin:    General: Skin is warm and dry.     Capillary Refill: Capillary refill takes less than 2 seconds.     Coloration:  Skin is not jaundiced or pale.     Findings: No rash.  Neurological:     Mental Status: She is alert and oriented to person, place, and time.     Motor: No abnormal muscle tone.     Gait: Gait normal.  Psychiatric:        Speech: Speech normal.        Behavior: Behavior normal.      Results for orders placed or performed during the hospital encounter of 11/28/19  Chlamydia/NGC rt PCR ([facility] only)   Specimen: Cervical/Vaginal swab  Result Value Ref Range   Specimen source GC/Chlam ENDOCERVICAL    Chlamydia Tr NOT DETECTED NOT DETECTED   N gonorrhoeae NOT DETECTED NOT DETECTED  Wet prep, genital   Specimen: Cervical/Vaginal swab  Result Value Ref Range   Yeast Wet Prep HPF POC NONE SEEN NONE SEEN   Trich, Wet Prep NONE SEEN NONE SEEN   Clue Cells Wet Prep HPF POC PRESENT (A) NONE SEEN   WBC, Wet Prep HPF POC RARE (A) NONE SEEN   Sperm NONE SEEN   Urinalysis, Routine w reflex microscopic  Result Value Ref Range   Color, Urine STRAW (A) YELLOW   APPearance CLEAR (A) CLEAR   Specific Gravity, Urine 1.014 1.005 - 1.030   pH 7.0 5.0 - 8.0   Glucose, UA NEGATIVE NEGATIVE mg/dL   Hgb urine dipstick NEGATIVE NEGATIVE   Bilirubin Urine NEGATIVE NEGATIVE   Ketones, ur NEGATIVE NEGATIVE mg/dL   Protein, ur NEGATIVE NEGATIVE mg/dL   Nitrite NEGATIVE NEGATIVE   Leukocytes,Ua NEGATIVE NEGATIVE  CBC  Result Value Ref Range   WBC 7.9 4.0 - 10.5 K/uL  RBC 4.44 3.87 - 5.11 MIL/uL   Hemoglobin 12.5 12.0 - 15.0 g/dL   HCT 38.2 36.0 - 46.0 %   MCV 86.0 80.0 - 100.0 fL   MCH 28.2 26.0 - 34.0 pg   MCHC 32.7 30.0 - 36.0 g/dL   RDW 14.5 11.5 - 15.5 %   Platelets 326 150 - 400 K/uL   nRBC 0.0 0.0 - 0.2 %  Basic metabolic panel  Result Value Ref Range   Sodium 137 135 - 145 mmol/L   Potassium 4.0 3.5 - 5.1 mmol/L   Chloride 108 98 - 111 mmol/L   CO2 22 22 - 32 mmol/L   Glucose, Bld 95 70 - 99 mg/dL   BUN 15 6 - 20 mg/dL   Creatinine, Ser 0.72 0.44 - 1.00 mg/dL   Calcium 9.2 8.9 - 10.3  mg/dL   GFR calc non Af Amer >60 >60 mL/min   GFR calc Af Amer >60 >60 mL/min   Anion gap 7 5 - 15  hCG, quantitative, pregnancy  Result Value Ref Range   hCG, Beta Chain, Quant, S 58 (H) <5 mIU/mL  Pregnancy, urine POC  Result Value Ref Range   Preg Test, Ur NEGATIVE NEGATIVE        Assessment & Plan:      ICD-10-CM   1. Positive blood pregnancy test  Z32.01 Ambulatory referral to Obstetrics / Gynecology  2. Nausea and vomiting in pregnancy  O21.9 Doxylamine-Pyridoxine 10-10 MG TBEC     Very early pregnancy, diagnosed by the ER, patient here requesting referral well put in referral per her request, this may also need to be signed by her PCP -per insurance  She has some mild nausea and vomiting encouraged her to stay hydrated have crackers and small amounts of food in her stomach to prevent worsening nausea and vomiting can try Diclegis explained dosing to her to start with 1 pill at night see if it improves her nausea and vomiting the next day if she continues to have nausea and vomiting take 2 pills at night and continue to monitor she can increase to 2 pills at night, 1 pill at lunch and 1 pill in the morning with a maximum dosing for nausea vomiting in early pregnancy.   Delsa Grana, PA-C 11/28/19 2:46 PM

## 2019-11-28 NOTE — ED Provider Notes (Signed)
[facility] Emergency Department Provider Note  ____________________________________________   First MD Initiated Contact with Patient 11/28/19 0009     (approximate)  I have reviewed the triage vital signs and the nursing notes.   HISTORY  Chief Complaint Pelvic Pain    HPI Grace [name] is a 19 y.o. female G2P01 previous miscarriage last year, last menstrual period March 13, past medical history of gonorrhea chlamydia twice presents to the emergency department secondary to pelvic discomfort with recent positive home pregnancy test on Friday.  Patient denies any vaginal bleeding.  Patient denies any vaginal discharge.  Patient states that current discomfort is 6 out of 10.        Past Medical History:  Diagnosis Date  . Acne   . Allergy    ALLERGIC RHINITIS  . Anxiety   . Asthma   . Deliberate self-cutting   . Depression   . Fibrocystic disease of breast   . Head pain   . History of depression 12/20/2015  . History of self-harm   . Knee pain, right   . Primary dysmenorrhea   . Severe myopia of both eyes     Patient Active Problem List   Diagnosis Date Noted  . BV (bacterial vaginosis) 03/02/2019  . Mood disorder (HCC) 11/15/2018  . PTSD (post-traumatic stress disorder) 11/15/2018  . Class 1 obesity due to excess calories without serious comorbidity with body mass index (BMI) of 30.0 to 30.9 in adult 11/15/2018  . Neutropenia (HCC) 08/06/2018  . Major depression, recurrent, chronic (HCC) 08/06/2018  . GAD (generalized anxiety disorder) 08/06/2018  . Panic attack 01/22/2016  . Chronic allergic rhinitis 12/20/2015  . Asthma, moderate persistent, poorly-controlled 12/20/2015  . 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 LAPAROSCOPY  05/07/2017   Procedure: LAPAROSCOPY DIAGNOSTIC;  Surgeon: Feliberto Gottron Ihor Austin, MD;  Location: [facility] ORS;  Service: Gynecology;;    Prior to Admission  medications   Medication Sig Start Date End Date Taking? Authorizing Provider  albuterol (VENTOLIN HFA) 108 (90 Base) MCG/ACT inhaler Inhale 2 puffs into the lungs every 6 (six) hours as needed for wheezing or shortness of breath. 09/02/19   Doren Custard, FNP  ARIPiprazole (ABILIFY) 5 MG tablet Take 1 tablet (5 mg total) by mouth daily. 09/03/18   Doren Custard, FNP  budesonide-formoterol (SYMBICORT) 160-4.5 MCG/ACT inhaler Inhale 2 puffs into the lungs 2 (two) times daily. 09/02/19   Doren Custard, FNP  cetirizine (ZYRTEC) 10 MG tablet Take 1 tablet (10 mg total) by mouth daily. 09/02/19   Doren Custard, FNP  famotidine (PEPCID) 40 MG tablet TAKE 1 TABLET(40 MG) BY MOUTH AT BEDTIME 04/24/19   Alba Cory, MD  hydrOXYzine (VISTARIL) 25 MG capsule Take 25 mg by mouth every 8 (eight) hours as needed for anxiety.    [provider]  imiquimod (ALDARA) 5 % cream Apply topically 3 (three) times a week. 11/23/19   Danelle Berry, PA-C  metroNIDAZOLE (FLAGYL) 500 MG tablet Take 1 tablet (500 mg total) by mouth 2 (two) times daily for 7 days. 11/28/19 12/05/19  Darci Current, MD  montelukast (SINGULAIR) 10 MG tablet Take 1 tablet (10 mg total) by mouth at bedtime. 09/02/19   Doren Custard, FNP  polyethylene glycol powder (GLYCOLAX/MIRALAX) 17 GM/SCOOP powder Take 17 g by mouth daily. 12/20/18   Alba Cory, MD  Prenatal Vit-Fe Fumarate-FA (PRENATAL MULTIVITAMIN) TABS tablet Take 1 tablet by mouth daily  at 12 noon. 04/05/19   Alba Cory, MD  sertraline (ZOLOFT) 100 MG tablet 100 mg.  03/14/19   [provider]  Spacer/Aero-Holding Chambers (OPTICHAMBER Gilber[place]-LG MASK) DEVI See admin instructions. use with inhaler 08/29/16   [provider]    Allergies Mold extract [trichophyton]  Family History  Problem Relation Age of Onset  . Asthma Father   . Hypertension Father   . ADD / ADHD Brother     Social History Social History   Tobacco Use  . Smoking status: Former  Smoker    Packs/day: 0.50    Years: 2.00    Pack years: 1.00    Start date: 03/01/2017  . Smokeless tobacco: Never Used  Substance Use Topics  . Alcohol use: No    Alcohol/week: 0.0 standard drinks  . Drug use: Not Currently    Frequency: 1.0 times per week    Types: Marijuana    Comment: presently denies    Review of Systems Constitutional: No fever/chills Eyes: No visual changes. ENT: No sore throat. Cardiovascular: Denies chest pain. Respiratory: Denies shortness of breath. Gastrointestinal: No abdominal pain.  No nausea, no vomiting.  No diarrhea.  No constipation. Genitourinary: Positive for pelvic discomfort.  Negative for dysuria. Musculoskeletal: Negative for neck pain.  Negative for back pain. Integumentary: Negative for rash. Neurological: Negative for headaches, focal weakness or numbness.  ____________________________________________   PHYSICAL EXAM:  VITAL SIGNS: ED Triage Vitals  Enc Vitals Group     BP 11/27/19 2153 133/79     Pulse Rate 11/27/19 2153 94     Resp 11/27/19 2153 16     Temp 11/27/19 2153 98.5 F (36.9 C)     Temp Source 11/27/19 2153 Oral     SpO2 11/27/19 2153 100 %     Weight 11/27/19 2156 79.4 kg (175 lb)     Height 11/27/19 2156 1.651 m (5\' 5" )     Head Circumference --      Peak Flow --      Pain Score 11/27/19 2155 8     Pain Loc --      Pain Edu? --      Excl. in GC? --     Constitutional: Alert and oriented.  Eyes: Conjunctivae are normal.  Mouth/Throat: Patient is wearing a mask. Neck: No stridor.  No meningeal signs.   Cardiovascular: Normal rate, regular rhythm. Good peripheral circulation. Grossly normal heart sounds. Respiratory: Normal respiratory effort.  No retractions. Gastrointestinal: Soft and nontender. No distention.  Genitourinary: No gross abnormality noted to the external genitalia, white vaginal discharge noted. Musculoskeletal: No lower extremity tenderness nor edema. No gross deformities of  extremities. Neurologic:  Normal speech and language. No gross focal neurologic deficits are appreciated.  Skin:  Skin is warm, dry and intact. Psychiatric: Mood and affect are normal. Speech and behavior are normal.  ____________________________________________   LABS (all labs ordered are listed, but only abnormal results are displayed)  Labs Reviewed  WET PREP, GENITAL - Abnormal; Notable for the following components:      Result Value   Clue Cells Wet Prep HPF POC PRESENT (*)    WBC, Wet Prep HPF POC RARE (*)    All other components within normal limits  URINALYSIS, ROUTINE W REFLEX MICROSCOPIC - Abnormal; Notable for the following components:   Color, Urine STRAW (*)    APPearance CLEAR (*)    All other components within normal limits  HCG, QUANTITATIVE, PREGNANCY - Abnormal; Notable for the following  components:   hCG, Beta Chain, Quant, S 58 (*)    All other components within normal limits  CHLAMYDIA/NGC RT PCR ([facility] ONLY)  CBC  BASIC METABOLIC PANEL  POC URINE PREG, ED     RADIOLOGY I, [place] Deamonte Sayegh, personally viewed and evaluated these images (plain radiographs) as part of my medical decision making, as well as reviewing the written report by the radiologist.  ED MD interpretation: No evidence of intrauterine pregnancy on ultrasound this may be due to early gestational age per radiologist.  Official radiology report(s): US OB LESS THAN 14 WEEKS WITH OB TRANSVAGINAL  Result Date: 11/28/2019 CLINICAL DATA:  Pelvic pain. EXAM: OBSTETRIC <14 WK [place] AND TRANSVAGINAL OB US TECHNIQUE: Both transabdominal and transvaginal ultrasound examinations were performed for complete evaluation of the gestation as well as the maternal uterus, adnexal regions, and pelvic cul-de-sac. Transvaginal technique was performed to assess early pregnancy. COMPARISON:  None. FINDINGS: Intrauterine gestational sac: None Yolk sac:  Not Visualized. Embryo:  Not Visualized. Cardiac Activity:  Not Visualized. Heart Rate: N/A  bpm Subchorionic hemorrhage:  None visualized. Maternal uterus/adnexae: The bilateral ovaries are normal in appearance. A trace amount of pelvic fluid is seen. IMPRESSION: No evidence to suggest the presence of an intrauterine pregnancy. This may be due to early gestational age. As a result, correlation with follow-up pelvic ultrasound is recommended. Electronically Signed   By: Virgina Norfolk M.D.   On: 11/28/2019 02:11    ______  Procedures   ____________________________________________   INITIAL IMPRESSION / MDM / [place] / ED COURSE  As part of my medical decision making, I reviewed the following data within the electronic MEDICAL RECORD NUMBER   19 year old female presented with above-stated history and physical exam differential diagnosis including but not limited to first trimester pregnancy, UTI STD.  Patient's hCG quantitative 58 and as such no intrauterine pregnancy was visualized on ultrasound most likely secondary to early gestational age.  Urinalysis revealed no evidence of a urinary tract infection.  Gonorrhea and Chlamydia negative.  Wet prep did reveal evidence of BV for which patient was prescribed Flagyl.  Patient will be referred to OB/GYN for further outpatient evaluation.  ____________________________________________  FINAL CLINICAL IMPRESSION(S) / ED DIAGNOSES  Final diagnoses:  First trimester pregnancy  BV (bacterial vaginosis)     MEDICATIONS GIVEN DURING THIS VISIT:  Medications - No data to display   ED Discharge Orders         Ordered    metroNIDAZOLE (FLAGYL) 500 MG tablet  2 times daily     11/28/19 0226          *Please note:  ANTIONETTA ATOR was evaluated in Emergency Department on 11/28/2019 for the symptoms described in the history of present illness. She was evaluated in the context of the global COVID-19 pandemic, which necessitated consideration that the patient might be at risk for infection with  the SARS-CoV-2 virus that causes COVID-19. Institutional protocols and algorithms that pertain to the evaluation of patients at risk for COVID-19 are in a state of rapid change based on information released by regulatory bodies including the CDC and federal and state organizations. These policies and algorithms were followed during the patient's care in the ED.  Some ED evaluations and interventions may be delayed as a result of limited staffing during the pandemic.*  Note:  This document was prepared using Dragon voice recognition software and may include unintentional dictation errors.   Gregor Hams, MD 11/28/19 [phone]

## 2019-11-29 ENCOUNTER — Telehealth: Payer: Self-pay

## 2019-11-29 NOTE — Telephone Encounter (Signed)
Told pt we just faxed over yesterday and to give a couple more days

## 2019-11-29 NOTE — Telephone Encounter (Signed)
Copied from CRM [id number]. Topic: General - Other >> Nov 29, 2019 10:30 AM Jaquita Rector A wrote: Reason for CRM: Patient called to say that OB GYN did not receive the referral that was sent over and she was hoping she can come pick up a copy to bring in herself. Please call patient at Ph# [phone]

## 2019-11-30 NOTE — Telephone Encounter (Signed)
Pt calling stating that she spoke with someone this morning stating that the OBGYN office did not receive the fax and they are asking office to resend the referral. Please advise,

## 2019-12-01 ENCOUNTER — Encounter: Payer: Self-pay | Admitting: Family Medicine

## 2019-12-01 NOTE — Telephone Encounter (Signed)
Notes were faxed but they are incomplete, Grace [name] has not signed it, so they wont proceed until it has been completed.

## 2019-12-02 ENCOUNTER — Emergency Department: Payer: No Typology Code available for payment source

## 2019-12-02 ENCOUNTER — Other Ambulatory Visit: Payer: Self-pay

## 2019-12-02 ENCOUNTER — Encounter: Payer: Self-pay | Admitting: *Deleted

## 2019-12-02 DIAGNOSIS — Z5321 Procedure and treatment not carried out due to patient leaving prior to being seen by health care provider: Secondary | ICD-10-CM | POA: Diagnosis not present

## 2019-12-02 DIAGNOSIS — O99891 Other specified diseases and conditions complicating pregnancy: Secondary | ICD-10-CM | POA: Diagnosis present

## 2019-12-02 DIAGNOSIS — Z3A01 Less than 8 weeks gestation of pregnancy: Secondary | ICD-10-CM | POA: Insufficient documentation

## 2019-12-02 DIAGNOSIS — R1084 Generalized abdominal pain: Secondary | ICD-10-CM | POA: Insufficient documentation

## 2019-12-02 LAB — URINALYSIS, COMPLETE (UACMP) WITH MICROSCOPIC
Bacteria, UA: NONE SEEN
Bilirubin Urine: NEGATIVE
Glucose, UA: NEGATIVE mg/dL
Hgb urine dipstick: NEGATIVE
Ketones, ur: NEGATIVE mg/dL
Leukocytes,Ua: NEGATIVE
Nitrite: NEGATIVE
Protein, ur: NEGATIVE mg/dL
Specific Gravity, Urine: 1.019 (ref 1.005–1.030)
pH: 7 (ref 5.0–8.0)

## 2019-12-02 LAB — COMPREHENSIVE METABOLIC PANEL
ALT: 15 U/L (ref 0–44)
AST: 16 U/L (ref 15–41)
Albumin: 4.1 g/dL (ref 3.5–5.0)
Alkaline Phosphatase: 67 U/L (ref 38–126)
Anion gap: 5 (ref 5–15)
BUN: 11 mg/dL (ref 6–20)
CO2: 27 mmol/L (ref 22–32)
Calcium: 9 mg/dL (ref 8.9–10.3)
Chloride: 106 mmol/L (ref 98–111)
Creatinine, Ser: 0.63 mg/dL (ref 0.44–1.00)
GFR calc Af Amer: >60 mL/min (ref >60)
GFR calc non Af Amer: >60 mL/min (ref >60)
Glucose, Bld: 84 mg/dL (ref 70–99)
Potassium: 3.8 mmol/L (ref 3.5–5.1)
Sodium: 138 mmol/L (ref 135–145)
Total Bilirubin: 0.5 mg/dL (ref 0.3–1.2)
Total Protein: 7.2 g/dL (ref 6.5–8.1)

## 2019-12-02 LAB — LIPASE, BLOOD: Lipase: 28 U/L (ref 11–51)

## 2019-12-02 LAB — CBC
HCT: 38.1 % (ref 36.0–46.0)
Hemoglobin: 12.4 g/dL (ref 12.0–15.0)
MCH: 28.5 pg (ref 26.0–34.0)
MCHC: 32.5 g/dL (ref 30.0–36.0)
MCV: 87.6 fL (ref 80.0–100.0)
Platelets: 314 10*3/uL (ref 150–400)
RBC: 4.35 MIL/uL (ref 3.87–5.11)
RDW: 14.7 % (ref 11.5–15.5)
WBC: 6.4 10*3/uL (ref 4.0–10.5)
nRBC: 0 % (ref 0.0–0.2)

## 2019-12-02 LAB — HCG, QUANTITATIVE, PREGNANCY: hCG, Beta Chain, Quant, S: 1036 m[IU]/mL — ABNORMAL HIGH (ref <5)

## 2019-12-02 MED ORDER — SODIUM CHLORIDE 0.9% FLUSH: 3.0000 mL | Freq: Once | INTRAVENOUS | Status: DC

## 2019-12-02 NOTE — ED Triage Notes (Signed)
Pt says that she was seen recently for abdominal cramping, says that her cramping is worse. She has not yet followed up with OBGYN (+preg, LMP 3/13). Denies vaginal bleeding or discharge.

## 2019-12-03 ENCOUNTER — Emergency Department
Admission: EM | Admit: 2019-12-03 | Discharge: 2019-12-03 | Disposition: A | Discharge status: Home / Self Care | Payer: No Typology Code available for payment source | Attending: Emergency Medicine | Admitting: Emergency Medicine

## 2019-12-03 DIAGNOSIS — R1030 Lower abdominal pain, unspecified: Secondary | ICD-10-CM

## 2019-12-06 ENCOUNTER — Encounter: Payer: Self-pay | Admitting: Family Medicine

## 2019-12-07 ENCOUNTER — Encounter: Payer: Self-pay | Admitting: Family Medicine

## 2019-12-07 ENCOUNTER — Ambulatory Visit: Payer: No Typology Code available for payment source | Admitting: Family Medicine

## 2019-12-13 ENCOUNTER — Ambulatory Visit: Payer: No Typology Code available for payment source | Admitting: Family Medicine

## 2019-12-14 ENCOUNTER — Emergency Department
Admission: EM | Admit: 2019-12-14 | Discharge: 2019-12-14 | Disposition: A | Discharge status: Home / Self Care | Payer: Medicaid Other | Attending: Emergency Medicine | Admitting: Emergency Medicine

## 2019-12-14 ENCOUNTER — Encounter: Payer: Self-pay | Admitting: Emergency Medicine

## 2019-12-14 ENCOUNTER — Other Ambulatory Visit: Payer: Self-pay

## 2019-12-14 DIAGNOSIS — N760 Acute vaginitis: Secondary | ICD-10-CM

## 2019-12-14 DIAGNOSIS — O26891 Other specified pregnancy related conditions, first trimester: Secondary | ICD-10-CM | POA: Diagnosis present

## 2019-12-14 DIAGNOSIS — B9689 Other specified bacterial agents as the cause of diseases classified elsewhere: Secondary | ICD-10-CM

## 2019-12-14 DIAGNOSIS — Z3A01 Less than 8 weeks gestation of pregnancy: Secondary | ICD-10-CM | POA: Insufficient documentation

## 2019-12-14 DIAGNOSIS — R102 Pelvic and perineal pain: Secondary | ICD-10-CM | POA: Diagnosis not present

## 2019-12-14 DIAGNOSIS — O23591 Infection of other part of genital tract in pregnancy, first trimester: Secondary | ICD-10-CM | POA: Insufficient documentation

## 2019-12-14 LAB — COMPREHENSIVE METABOLIC PANEL
ALT: 16 U/L (ref 0–44)
AST: 17 U/L (ref 15–41)
Albumin: 4.3 g/dL (ref 3.5–5.0)
Alkaline Phosphatase: 72 U/L (ref 38–126)
Anion gap: 6 (ref 5–15)
BUN: 12 mg/dL (ref 6–20)
CO2: 25 mmol/L (ref 22–32)
Calcium: 9.3 mg/dL (ref 8.9–10.3)
Chloride: 105 mmol/L (ref 98–111)
Creatinine, Ser: 0.73 mg/dL (ref 0.44–1.00)
GFR calc Af Amer: >60 mL/min (ref >60)
GFR calc non Af Amer: >60 mL/min (ref >60)
Glucose, Bld: 84 mg/dL (ref 70–99)
Potassium: 3.9 mmol/L (ref 3.5–5.1)
Sodium: 136 mmol/L (ref 135–145)
Total Bilirubin: 0.4 mg/dL (ref 0.3–1.2)
Total Protein: 7.4 g/dL (ref 6.5–8.1)

## 2019-12-14 LAB — CBC
HCT: 37.4 % (ref 36.0–46.0)
Hemoglobin: 12.4 g/dL (ref 12.0–15.0)
MCH: 28.6 pg (ref 26.0–34.0)
MCHC: 33.2 g/dL (ref 30.0–36.0)
MCV: 86.2 fL (ref 80.0–100.0)
Platelets: 301 10*3/uL (ref 150–400)
RBC: 4.34 MIL/uL (ref 3.87–5.11)
RDW: 14.4 % (ref 11.5–15.5)
WBC: 4.7 10*3/uL (ref 4.0–10.5)
nRBC: 0 % (ref 0.0–0.2)

## 2019-12-14 LAB — URINALYSIS, COMPLETE (UACMP) WITH MICROSCOPIC
Bacteria, UA: NONE SEEN
Bilirubin Urine: NEGATIVE
Glucose, UA: NEGATIVE mg/dL
Hgb urine dipstick: NEGATIVE
Ketones, ur: NEGATIVE mg/dL
Leukocytes,Ua: NEGATIVE
Nitrite: NEGATIVE
Protein, ur: NEGATIVE mg/dL
Specific Gravity, Urine: 1.011 (ref 1.005–1.030)
pH: 7 (ref 5.0–8.0)

## 2019-12-14 LAB — HCG, QUANTITATIVE, PREGNANCY: hCG, Beta Chain, Quant, S: 40453 m[IU]/mL — ABNORMAL HIGH (ref <5)

## 2019-12-14 MED ORDER — METRONIDAZOLE 500 MG PO TABS: 500.0000 mg | ORAL_TABLET | Freq: Two times a day (BID) | ORAL | 0 refills | Status: DC

## 2019-12-14 NOTE — ED Triage Notes (Signed)
Pt reports is 6 weeks and 4 days pregnant and started with cramps a couple of days ago. Pt reports has been seen once for the same but the pain is worse. Pt denies vaginal bleeding.

## 2019-12-14 NOTE — ED Notes (Signed)
AAOx3.  Skin warm and dry.   NAD. C/O lower abdominal cramping, uncomfortable at night while trying to sleep. Denies dysuria and vaginal bleeding.

## 2019-12-14 NOTE — ED Provider Notes (Signed)
[facility] Emergency Department Provider Note   ____________________________________________    I have reviewed the triage vital signs and the nursing notes.   HISTORY  Chief Complaint Abdominal Cramping     HPI Grace [name] is a 19 y.o. female with history as noted below who reports that she is approximately [redacted] weeks pregnant.  She reports she had an ultrasound on April 16 which demonstrated an IUP.  She is now having a vaginal bleeding but complains of intermittent pelvic cramping bilaterally.  Was seen in the emergency department on the fifth of this month and was diagnosed with bacterial vaginosis at that time and was prescribed Flagyl.  She admits that she did not take this medication.  Denies fevers chills nausea or vomiting.  Has not taken any medications for her cramping.  Past Medical History:  Diagnosis Date  . Acne   . Allergy    ALLERGIC RHINITIS  . Anxiety   . Asthma   . Deliberate self-cutting   . Depression   . Fibrocystic disease of breast   . Head pain   . History of depression 12/20/2015  . History of self-harm   . Knee pain, right   . Primary dysmenorrhea   . Severe myopia of both eyes     Patient Active Problem List   Diagnosis Date Noted  . BV (bacterial vaginosis) 03/02/2019  . Mood disorder ([place]) 11/15/2018  . PTSD (post-traumatic stress disorder) 11/15/2018  . Class 1 obesity due to excess calories without serious comorbidity with body mass index (BMI) of 30.0 to 30.9 in adult 11/15/2018  . Neutropenia ([place]) 08/06/2018  . Major depression, recurrent, chronic ([place]) 08/06/2018  . GAD (generalized anxiety disorder) 08/06/2018  . Panic attack 01/22/2016  . Chronic allergic rhinitis 12/20/2015  . Asthma, moderate persistent, poorly-controlled 12/20/2015  . 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 LAPAROSCOPY  05/07/2017   Procedure: LAPAROSCOPY DIAGNOSTIC;  Surgeon:  Ouida Sills Gwen Her, MD;  Location: [facility] ORS;  Service: Gynecology;;    Prior to Admission medications   Medication Sig Start Date End Date Taking? Authorizing Provider  albuterol (VENTOLIN HFA) 108 (90 Base) MCG/ACT inhaler Inhale 2 puffs into the lungs every 6 (six) hours as needed for wheezing or shortness of breath. 09/02/19   Hubbard Hartshorn, FNP  ARIPiprazole (ABILIFY) 5 MG tablet Take 1 tablet (5 mg total) by mouth daily. Patient not taking: Reported on 11/28/2019 09/03/18   Hubbard Hartshorn, FNP  budesonide-formoterol [facility]) 160-4.5 MCG/ACT inhaler Inhale 2 puffs into the lungs 2 (two) times daily. Patient not taking: Reported on 11/28/2019 09/02/19   Hubbard Hartshorn, FNP  cetirizine (ZYRTEC) 10 MG tablet Take 1 tablet (10 mg total) by mouth daily. 09/02/19   Hubbard Hartshorn, FNP  Doxylamine-Pyridoxine 10-10 MG TBEC Take 1 tablet by mouth at bedtime. 11/28/19   Delsa Grana, PA-C  famotidine (PEPCID) 40 MG tablet TAKE 1 TABLET(40 MG) BY MOUTH AT BEDTIME 04/24/19   Steele Sizer, MD  hydrOXYzine (VISTARIL) 25 MG capsule Take 25 mg by mouth every 8 (eight) hours as needed for anxiety.    [provider]  imiquimod (ALDARA) 5 % cream Apply topically 3 (three) times a week. 11/23/19   Delsa Grana, PA-C  metroNIDAZOLE (FLAGYL) 500 MG tablet Take 1 tablet (500 mg total) by mouth 2 (two) times daily after a meal. 12/14/19   Lavonia Drafts, MD  montelukast (SINGULAIR) 10 MG tablet  Take 1 tablet (10 mg total) by mouth at bedtime. 09/02/19   Doren Custard, FNP  polyethylene glycol powder (GLYCOLAX/MIRALAX) 17 GM/SCOOP powder Take 17 g by mouth daily. 12/20/18   Alba Cory, MD  Prenatal Vit-Fe Fumarate-FA (PRENATAL MULTIVITAMIN) TABS tablet Take 1 tablet by mouth daily at 12 noon. 04/05/19   Alba Cory, MD  sertraline (ZOLOFT) 100 MG tablet 100 mg.  03/14/19   [provider]  Spacer/Aero-Holding Chambers (OPTICHAMBER [place]-LG MASK) DEVI See admin instructions. use with inhaler  08/29/16   [provider]     Allergies Mold extract [trichophyton]  Family History  Problem Relation Age of Onset  . Asthma Father   . Hypertension Father   . ADD / ADHD Brother     Social History Social History   Tobacco Use  . Smoking status: Former Smoker    Packs/day: 0.50    Years: 2.00    Pack years: 1.00    Start date: 03/01/2017  . Smokeless tobacco: Never Used  Substance Use Topics  . Alcohol use: No    Alcohol/week: 0.0 standard drinks  . Drug use: Not Currently    Frequency: 1.0 times per week    Types: Marijuana    Comment: presently denies    Review of Systems  Constitutional: No fever/chills Eyes: No visual changes.  ENT: No sore throat. Cardiovascular: Denies chest pain. Respiratory: Denies shortness of breath. Gastrointestinal: As above Genitourinary: As above, no dysuria, no vaginal bleeding Musculoskeletal: Negative for back pain. Skin: Negative for rash. Neurological: Negative for headaches   ____________________________________________   PHYSICAL EXAM:  VITAL SIGNS: ED Triage Vitals  Enc Vitals Group     BP 12/14/19 1054 128/70     Pulse Rate 12/14/19 1054 97     Resp 12/14/19 1054 18     Temp 12/14/19 1054 98.4 F (36.9 C)     Temp Source 12/14/19 1054 Oral     SpO2 12/14/19 1054 98 %     Weight 12/14/19 1055 80.7 kg (178 lb)     Height 12/14/19 1055 1.676 m (5\' 6" )     Head Circumference --      Peak Flow --      Pain Score 12/14/19 1057 7     Pain Loc --      Pain Edu? --      Excl. in GC? --     Constitutional: Alert and oriented.   Nose: No congestion/rhinnorhea. Mouth/Throat: Mucous membranes are moist.    Cardiovascular: Normal rate, regular rhythm Good peripheral circulation. Respiratory: Normal respiratory effort.  No retractions. Lungs CTAB. Gastrointestinal: Soft and nontender. No distention.  No CVA tenderness.  Reassuring exam Genitourinary: deferred Musculoskeletal: .  Warm and well  perfused Neurologic:  Normal speech and language. No gross focal neurologic deficits are appreciated.  Skin:  Skin is warm, dry and intact. No rash noted. Psychiatric: Mood and affect are normal. Speech and behavior are normal.  ____________________________________________   LABS (all labs ordered are listed, but only abnormal results are displayed)  Labs Reviewed  URINALYSIS, COMPLETE (UACMP) WITH MICROSCOPIC - Abnormal; Notable for the following components:      Result Value   Color, Urine YELLOW (*)    APPearance CLEAR (*)    All other components within normal limits  HCG, QUANTITATIVE, PREGNANCY - Abnormal; Notable for the following components:   hCG, Beta Chain, Quant, S 40,453 (*)    All other components within normal limits  COMPREHENSIVE METABOLIC PANEL  CBC  POC URINE PREG, ED   ____________________________________________  EKG  None ____________________________________________  RADIOLOGY  None, patient with recent ultrasound which demonstrated IUP performed at [facility] clinic ____________________________________________   PROCEDURES  Procedure(s) performed: No  Procedures   Critical Care performed: No ____________________________________________   INITIAL IMPRESSION / ASSESSMENT AND PLAN / ED COURSE  Pertinent labs & imaging results that were available during my care of the patient were reviewed by me and considered in my medical decision making (see chart for details).  Patient presents with cramping as discussed above.  Strongly suspicious for bacterial vaginosis given diagnosis earlier in the month and she has not taken anything for it.  Not consistent with urinary tract infection as urinalysis today is reassuring.  Lab work is unremarkable.  hCG has increased as expected.  No vaginal bleeding to suggest miscarriage or subchorionic hemorrhage.  Recommended patient take antibiotics as prescribed, outpatient follow-up with her OB.     ____________________________________________   FINAL CLINICAL IMPRESSION(S) / ED DIAGNOSES  Final diagnoses:  BV (bacterial vaginosis)        Note:  This document was prepared using Dragon voice recognition software and may include unintentional dictation errors.   Jene Every, MD 12/14/19 1431

## 2019-12-14 NOTE — ED Notes (Signed)
AAOx3.  Skin warm and dry.  NAD 

## 2019-12-20 ENCOUNTER — Ambulatory Visit: Payer: No Typology Code available for payment source | Attending: Internal Medicine

## 2019-12-20 DIAGNOSIS — Z23 Encounter for immunization: Secondary | ICD-10-CM

## 2019-12-20 NOTE — Progress Notes (Signed)
   Covid-19 Vaccination Clinic  Name:  Grace [name]    MRN: [id number] DOB: 2001-08-24  12/20/2019  [name] was observed post Covid-19 immunization for 15 minutes without incident. She was provided with Vaccine Information Sheet and instruction to access the V-Safe system.   [name] was instructed to call 911 with any severe reactions post vaccine: Marland Kitchen Difficulty breathing  . Swelling of face and throat  . A fast heartbeat  . A bad rash all over body  . Dizziness and weakness   Immunizations Administered    Name Date Dose VIS Date Route   Pfizer COVID-19 Vaccine 12/20/2019  1:56 PM 0.3 mL 10/19/2018 Intramuscular   Manufacturer: [employer], [employer]   Lot: [id number]   NDC: [address]-3

## 2019-12-24 ENCOUNTER — Other Ambulatory Visit: Payer: Self-pay

## 2019-12-24 ENCOUNTER — Encounter: Payer: Self-pay | Admitting: Emergency Medicine

## 2019-12-24 ENCOUNTER — Emergency Department
Admission: EM | Admit: 2019-12-24 | Discharge: 2019-12-24 | Disposition: A | Discharge status: Home / Self Care | Payer: Medicaid Other | Attending: Emergency Medicine | Admitting: Emergency Medicine

## 2019-12-24 DIAGNOSIS — R102 Pelvic and perineal pain: Secondary | ICD-10-CM | POA: Insufficient documentation

## 2019-12-24 DIAGNOSIS — O26891 Other specified pregnancy related conditions, first trimester: Secondary | ICD-10-CM | POA: Insufficient documentation

## 2019-12-24 DIAGNOSIS — Z3A01 Less than 8 weeks gestation of pregnancy: Secondary | ICD-10-CM | POA: Diagnosis not present

## 2019-12-24 LAB — URINALYSIS, ROUTINE W REFLEX MICROSCOPIC
Bilirubin Urine: NEGATIVE
Glucose, UA: NEGATIVE mg/dL
Hgb urine dipstick: NEGATIVE
Ketones, ur: NEGATIVE mg/dL
Leukocytes,Ua: NEGATIVE
Nitrite: NEGATIVE
Protein, ur: NEGATIVE mg/dL
Specific Gravity, Urine: 1.01 (ref 1.005–1.030)
pH: 7 (ref 5.0–8.0)

## 2019-12-24 NOTE — Discharge Instructions (Addendum)
Please seek medical attention for any high fevers, chest pain, shortness of breath, change in behavior, persistent vomiting, bloody stool or any other new or concerning symptoms.  

## 2019-12-24 NOTE — ED Triage Notes (Signed)
Pt arrives ambulatory to triage with c/o pelvic pain at this time. Pt denies any trauma or injury, or bleeding. Pt is in NAD and is 7 weeks 2 days due 08/11/20.

## 2019-12-24 NOTE — ED Provider Notes (Signed)
[facility] Emergency Department Provider Note   ____________________________________________   I have reviewed the triage vital signs and the nursing notes.   HISTORY  Chief Complaint Pelvic Pain   History limited by: Not Limited   HPI Grace [name] is a 19 y.o. female who presents to the emergency department today because of concern for pelvic pain in the setting of early pregnancy.  The pain has been intermittent.  She states that she has noticed that the pain presents when she starts having gas.  When she expels the gas the pain is somewhat relieved.  She states she has had the pain for couple of days.  Has had pelvic pain earlier this pregnancy and was diagnosed with bacterial vaginosis.  She states she is now finished her course of antibiotics.  She denies any vaginal discharge or bleeding.  She denies any fevers.  Records reviewed. Per medical record review patient has a history of bacterial vaginosis.   Past Medical History:  Diagnosis Date  . Acne   . Allergy    ALLERGIC RHINITIS  . Anxiety   . Asthma   . Deliberate self-cutting   . Depression   . Fibrocystic disease of breast   . Head pain   . History of depression 12/20/2015  . History of self-harm   . Knee pain, right   . Primary dysmenorrhea   . Severe myopia of both eyes     Patient Active Problem List   Diagnosis Date Noted  . BV (bacterial vaginosis) 03/02/2019  . Mood disorder (HCC) 11/15/2018  . PTSD (post-traumatic stress disorder) 11/15/2018  . Class 1 obesity due to excess calories without serious comorbidity with body mass index (BMI) of 30.0 to 30.9 in adult 11/15/2018  . Neutropenia (HCC) 08/06/2018  . Major depression, recurrent, chronic (HCC) 08/06/2018  . GAD (generalized anxiety disorder) 08/06/2018  . Panic attack 01/22/2016  . Chronic allergic rhinitis 12/20/2015  . Asthma, moderate persistent, poorly-controlled 12/20/2015  . Primary dysmenorrhea 12/20/2015     Past Surgical History:  Procedure Laterality Date  . LAPAROSCOPY  05/07/2017   Procedure: LAPAROSCOPY DIAGNOSTIC;  Surgeon: Feliberto Gottron Ihor Austin, MD;  Location: [facility] ORS;  Service: Gynecology;;    Prior to Admission medications   Medication Sig Start Date End Date Taking? Authorizing Provider  albuterol (VENTOLIN HFA) 108 (90 Base) MCG/ACT inhaler Inhale 2 puffs into the lungs every 6 (six) hours as needed for wheezing or shortness of breath. 09/02/19   Doren Custard, FNP  ARIPiprazole (ABILIFY) 5 MG tablet Take 1 tablet (5 mg total) by mouth daily. Patient not taking: Reported on 11/28/2019 09/03/18   Doren Custard, FNP  budesonide-formoterol [facility]) 160-4.5 MCG/ACT inhaler Inhale 2 puffs into the lungs 2 (two) times daily. Patient not taking: Reported on 11/28/2019 09/02/19   Doren Custard, FNP  cetirizine (ZYRTEC) 10 MG tablet Take 1 tablet (10 mg total) by mouth daily. 09/02/19   Doren Custard, FNP  Doxylamine-Pyridoxine 10-10 MG TBEC Take 1 tablet by mouth at bedtime. 11/28/19   Danelle Berry, PA-C  famotidine (PEPCID) 40 MG tablet TAKE 1 TABLET(40 MG) BY MOUTH AT BEDTIME 04/24/19   Alba Cory, MD  hydrOXYzine (VISTARIL) 25 MG capsule Take 25 mg by mouth every 8 (eight) hours as needed for anxiety.    [provider]  imiquimod (ALDARA) 5 % cream Apply topically 3 (three) times a week. 11/23/19   Danelle Berry, PA-C  metroNIDAZOLE (FLAGYL) 500 MG tablet Take 1 tablet (  500 mg total) by mouth 2 (two) times daily after a meal. 12/14/19   Lavonia Drafts, MD  montelukast (SINGULAIR) 10 MG tablet Take 1 tablet (10 mg total) by mouth at bedtime. 09/02/19   Hubbard Hartshorn, FNP  polyethylene glycol powder (GLYCOLAX/MIRALAX) 17 GM/SCOOP powder Take 17 g by mouth daily. 12/20/18   Steele Sizer, MD  Prenatal Vit-Fe Fumarate-FA (PRENATAL MULTIVITAMIN) TABS tablet Take 1 tablet by mouth daily at 12 noon. 04/05/19   Steele Sizer, MD  sertraline (ZOLOFT) 100 MG tablet 100 mg.  03/14/19    [provider]  Spacer/Aero-Holding Chambers (OPTICHAMBER Jacolyn-LG MASK) [place] See admin instructions. use with inhaler 08/29/16   [provider]    Allergies Mold extract [trichophyton]  Family History  Problem Relation Age of Onset  . Asthma Father   . Hypertension Father   . ADD / ADHD Brother     Social History Social History   Tobacco Use  . Smoking status: Former Smoker    Packs/day: 0.50    Years: 2.00    Pack years: 1.00    Start date: 03/01/2017  . Smokeless tobacco: Never Used  Substance Use Topics  . Alcohol use: No    Alcohol/week: 0.0 standard drinks  . Drug use: Not Currently    Frequency: 1.0 times per week    Types: Marijuana    Comment: presently denies    Review of Systems Constitutional: No fever/chills Eyes: No visual changes. ENT: No sore throat. Cardiovascular: Denies chest pain. Respiratory: Denies shortness of breath. Gastrointestinal: Positive for pelvic pain. Genitourinary: Negative for dysuria. Musculoskeletal: Negative for back pain. Skin: Negative for rash. Neurological: Negative for headaches, focal weakness or numbness.  ____________________________________________   PHYSICAL EXAM:  VITAL SIGNS: ED Triage Vitals  Enc Vitals Group     BP 12/24/19 2110 (!) 146/74     Pulse Rate 12/24/19 2110 95     Resp 12/24/19 2110 18     Temp 12/24/19 2110 98.3 F (36.8 C)     Temp Source 12/24/19 2110 Oral     SpO2 12/24/19 2110 99 %     Weight 12/24/19 2108 181 lb (82.1 kg)     Height 12/24/19 2108 5\' 6"  (1.676 m)     Head Circumference --      Peak Flow --      Pain Score 12/24/19 2107 7   Constitutional: Alert and oriented.  Eyes: Conjunctivae are normal.  ENT      Head: Normocephalic and atraumatic.      Nose: No congestion/rhinnorhea.      Mouth/Throat: Mucous membranes are moist.      Neck: No stridor. Cardiovascular: Normal rate, regular rhythm.  No murmurs, rubs, or gallops. Respiratory: Normal  respiratory effort without tachypnea nor retractions. Breath sounds are clear and equal bilaterally. No wheezes/rales/rhonchi. Gastrointestinal: Soft and non tender. No rebound. No guarding.  Genitourinary: Deferred Musculoskeletal: Normal range of motion in all extremities. No lower extremity edema. Neurologic:  Normal speech and language. No gross focal neurologic deficits are appreciated.  Skin:  Skin is warm, dry and intact. No rash noted. Psychiatric: Mood and affect are normal. Speech and behavior are normal. Patient exhibits appropriate insight and judgment.  ____________________________________________    LABS (pertinent positives/negatives)  UA clear, negative nitrite, negative leukocytes  ____________________________________________   EKG  None  ____________________________________________    RADIOLOGY  None  ____________________________________________   PROCEDURES  Procedures  ____________________________________________   INITIAL IMPRESSION / ASSESSMENT AND PLAN / ED  COURSE  Pertinent labs & imaging results that were available during my care of the patient were reviewed by me and considered in my medical decision making (see chart for details).   Patient presents to the emergency department today because of concerns for pelvic pain in the setting of early pregnancy.  Patient's abdomen is benign.  She states the pain is worse when she has the feeling that she needs to expel gas.  Then proves after expelling gas.  She states she has now finished her antibiotic regimen for bacterial vaginosis and denies any new vaginal discharge.  Did offer to send off repeat testing to look for bacterial vaginosis although patient was comfortable deferring.  At this point given clinical history and abdominal exam I doubt significant intra-abdominal infection or pathology.  Patient stated she has had an ultrasound already on this pregnancy so I doubt ectopic. Discussed return  precautions.   ____________________________________________   FINAL CLINICAL IMPRESSION(S) / ED DIAGNOSES  Final diagnoses:  Abdominal pain during pregnancy in first trimester     Note: This dictation was prepared with Dragon dictation. Any transcriptional errors that result from this process are unintentional     Phineas Semen, MD 12/24/19 2349

## 2019-12-25 LAB — POCT PREGNANCY, URINE: Preg Test, Ur: POSITIVE — AB

## 2019-12-27 ENCOUNTER — Ambulatory Visit: Payer: No Typology Code available for payment source | Admitting: Family Medicine

## 2020-01-12 DIAGNOSIS — O0993 Supervision of high risk pregnancy, unspecified, third trimester: Secondary | ICD-10-CM

## 2020-01-19 LAB — OB RESULTS CONSOLE RPR: RPR: NONREACTIVE

## 2020-01-19 LAB — OB RESULTS CONSOLE VARICELLA ZOSTER ANTIBODY, IGG: Varicella: IMMUNE

## 2020-01-19 LAB — OB RESULTS CONSOLE HEPATITIS B SURFACE ANTIGEN: Hepatitis B Surface Ag: NEGATIVE

## 2020-01-19 LAB — OB RESULTS CONSOLE RUBELLA ANTIBODY, IGM: Rubella: IMMUNE

## 2020-02-23 ENCOUNTER — Other Ambulatory Visit: Payer: Self-pay

## 2020-02-23 ENCOUNTER — Emergency Department
Admission: EM | Admit: 2020-02-23 | Discharge: 2020-02-23 | Disposition: A | Discharge status: Home / Self Care | Payer: Medicaid Other | Attending: Emergency Medicine | Admitting: Emergency Medicine

## 2020-02-23 ENCOUNTER — Emergency Department: Payer: Medicaid Other

## 2020-02-23 DIAGNOSIS — R091 Pleurisy: Secondary | ICD-10-CM | POA: Diagnosis not present

## 2020-02-23 DIAGNOSIS — Z79899 Other long term (current) drug therapy: Secondary | ICD-10-CM | POA: Diagnosis not present

## 2020-02-23 DIAGNOSIS — Z3A16 16 weeks gestation of pregnancy: Secondary | ICD-10-CM | POA: Diagnosis not present

## 2020-02-23 DIAGNOSIS — Z87891 Personal history of nicotine dependence: Secondary | ICD-10-CM | POA: Diagnosis not present

## 2020-02-23 DIAGNOSIS — R102 Pelvic and perineal pain: Secondary | ICD-10-CM

## 2020-02-23 DIAGNOSIS — Z792 Long term (current) use of antibiotics: Secondary | ICD-10-CM | POA: Insufficient documentation

## 2020-02-23 DIAGNOSIS — R093 Abnormal sputum: Secondary | ICD-10-CM | POA: Insufficient documentation

## 2020-02-23 DIAGNOSIS — J40 Bronchitis, not specified as acute or chronic: Secondary | ICD-10-CM | POA: Insufficient documentation

## 2020-02-23 DIAGNOSIS — Z7951 Long term (current) use of inhaled steroids: Secondary | ICD-10-CM | POA: Diagnosis not present

## 2020-02-23 DIAGNOSIS — R0789 Other chest pain: Secondary | ICD-10-CM | POA: Insufficient documentation

## 2020-02-23 DIAGNOSIS — O99512 Diseases of the respiratory system complicating pregnancy, second trimester: Secondary | ICD-10-CM | POA: Diagnosis present

## 2020-02-23 LAB — CBC WITH DIFFERENTIAL/PLATELET
Abs Immature Granulocytes: 0.02 10*3/uL (ref 0.00–0.07)
Basophils Absolute: 0 10*3/uL (ref 0.0–0.1)
Basophils Relative: 1 %
Eosinophils Absolute: 0 10*3/uL (ref 0.0–0.5)
Eosinophils Relative: 1 %
HCT: 34.9 % — ABNORMAL LOW (ref 36.0–46.0)
Hemoglobin: 12.3 g/dL (ref 12.0–15.0)
Immature Granulocytes: 0 %
Lymphocytes Relative: 23 %
Lymphs Abs: 1.5 10*3/uL (ref 0.7–4.0)
MCH: 29.4 pg (ref 26.0–34.0)
MCHC: 35.2 g/dL (ref 30.0–36.0)
MCV: 83.3 fL (ref 80.0–100.0)
Monocytes Absolute: 0.4 10*3/uL (ref 0.1–1.0)
Monocytes Relative: 6 %
Neutro Abs: 4.5 10*3/uL (ref 1.7–7.7)
Neutrophils Relative %: 69 %
Platelets: 278 10*3/uL (ref 150–400)
RBC: 4.19 MIL/uL (ref 3.87–5.11)
RDW: 13.8 % (ref 11.5–15.5)
WBC: 6.5 10*3/uL (ref 4.0–10.5)
nRBC: 0 % (ref 0.0–0.2)

## 2020-02-23 LAB — COMPREHENSIVE METABOLIC PANEL
ALT: 10 U/L (ref 0–44)
AST: 12 U/L — ABNORMAL LOW (ref 15–41)
Albumin: 3.7 g/dL (ref 3.5–5.0)
Alkaline Phosphatase: 64 U/L (ref 38–126)
Anion gap: 4 — ABNORMAL LOW (ref 5–15)
BUN: <5 mg/dL — ABNORMAL LOW (ref 6–20)
CO2: 26 mmol/L (ref 22–32)
Calcium: 9 mg/dL (ref 8.9–10.3)
Chloride: 105 mmol/L (ref 98–111)
Creatinine, Ser: 0.61 mg/dL (ref 0.44–1.00)
GFR calc Af Amer: >60 mL/min (ref >60)
GFR calc non Af Amer: >60 mL/min (ref >60)
Glucose, Bld: 87 mg/dL (ref 70–99)
Potassium: 4 mmol/L (ref 3.5–5.1)
Sodium: 135 mmol/L (ref 135–145)
Total Bilirubin: 0.4 mg/dL (ref 0.3–1.2)
Total Protein: 7.4 g/dL (ref 6.5–8.1)

## 2020-02-23 LAB — FIBRIN DERIVATIVES D-DIMER (ARMC ONLY): Fibrin derivatives D-dimer (ARMC): 425.17 ng/mL (FEU) (ref 0.00–499.00)

## 2020-02-23 MED ORDER — ALBUTEROL SULFATE HFA 108 (90 BASE) MCG/ACT IN AERS
2.0000 | INHALATION_SPRAY | Freq: Four times a day (QID) | RESPIRATORY_TRACT | 0 refills | Status: DC | PRN
Start: 2020-02-23 — End: 2020-04-03

## 2020-02-23 MED ORDER — ALBUTEROL SULFATE (2.5 MG/3ML) 0.083% IN NEBU: 2.5000 mg | INHALATION_SOLUTION | Freq: Once | RESPIRATORY_TRACT | Status: DC

## 2020-02-23 MED ORDER — IPRATROPIUM-ALBUTEROL 0.5-2.5 (3) MG/3ML IN SOLN
3.0000 mL | Freq: Once | RESPIRATORY_TRACT | Status: DC
  Filled 2020-02-23: qty 3

## 2020-02-23 MED ORDER — IPRATROPIUM-ALBUTEROL 0.5-2.5 (3) MG/3ML IN SOLN
3.0000 mL | Freq: Once | RESPIRATORY_TRACT | Status: AC
  Administered 2020-02-23: 3 mL via RESPIRATORY_TRACT
  Filled 2020-02-23: qty 3

## 2020-02-23 MED ORDER — AZITHROMYCIN 250 MG PO TABS
ORAL_TABLET | ORAL | 0 refills | Status: AC
Start: 2020-02-23 — End: 2020-02-28

## 2020-02-23 NOTE — ED Notes (Signed)
EDp malinda at bedside.

## 2020-02-23 NOTE — Discharge Instructions (Addendum)
Use the albuterol inhaler 2 puffs 4 times a day.  That should help with your cough.  Take the Zithromax 2 on the first day and 1 every day after that.  As the antibiotic should help with your cough as well.  Please return for any worsening especially to get any bleeding of fluid leaking.  Give Dr. Francesca Oman office a call and schedule follow-up with him.

## 2020-02-23 NOTE — ED Triage Notes (Addendum)
Pt arrives via POV for reports of cough, congestion, shob on exertion and lower abdominal and back pain/pressure x 3 days. Pt states she is about to be [redacted] weeks pregnant. Denies vaginal bleeding. Pt ambulatory from triage in NAD, A&Ox4.

## 2020-02-23 NOTE — ED Provider Notes (Signed)
[facility] Emergency Department Provider Note   ____________________________________________   First MD Initiated Contact with Patient 02/23/20 1707     (approximate)  I have reviewed the triage vital signs and the nursing notes.   HISTORY  Chief Complaint Abdominal Pain and Cough    HPI Grace [name] is a 19 y.o. female who reports about 3 days of coughing producing green phlegm.  She feels hot but has not had a fever.  She reports pleuritic chest pain in the right lower chest and when she coughs especially she has been having a lot of pain and pressure in her vagina and rectum.  She is not had any bleeding.  Past Medical History:  Diagnosis Date  . Acne   . Allergy    ALLERGIC RHINITIS  . Anxiety   . Asthma   . Deliberate self-cutting   . Depression   . Fibrocystic disease of breast   . Head pain   . History of depression 12/20/2015  . History of self-harm   . Knee pain, right   . Primary dysmenorrhea   . Severe myopia of both eyes     Patient Active Problem List   Diagnosis Date Noted  . BV (bacterial vaginosis) 03/02/2019  . Mood disorder (HCC) 11/15/2018  . PTSD (post-traumatic stress disorder) 11/15/2018  . Class 1 obesity due to excess calories without serious comorbidity with body mass index (BMI) of 30.0 to 30.9 in adult 11/15/2018  . Neutropenia (HCC) 08/06/2018  . Major depression, recurrent, chronic (HCC) 08/06/2018  . GAD (generalized anxiety disorder) 08/06/2018  . Panic attack 01/22/2016  . Chronic allergic rhinitis 12/20/2015  . Asthma, moderate persistent, poorly-controlled 12/20/2015  . 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 LAPAROSCOPY  05/07/2017   Procedure: LAPAROSCOPY DIAGNOSTIC;  Surgeon: Feliberto Gottron Ihor Austin, MD;  Location: [facility] ORS;  Service: Gynecology;;    Prior to Admission medications   Medication Sig Start Date End Date Taking? Authorizing Provider    albuterol (VENTOLIN HFA) 108 (90 Base) MCG/ACT inhaler Inhale 2 puffs into the lungs every 6 (six) hours as needed for wheezing or shortness of breath. 09/02/19   Doren Custard, FNP  albuterol (VENTOLIN HFA) 108 (90 Base) MCG/ACT inhaler Inhale 2 puffs into the lungs every 6 (six) hours as needed for wheezing or shortness of breath. 02/23/20   Arnaldo Natal, MD  ARIPiprazole (ABILIFY) 5 MG tablet Take 1 tablet (5 mg total) by mouth daily. Patient not taking: Reported on 11/28/2019 09/03/18   Doren Custard, FNP  azithromycin (ZITHROMAX Z-PAK) 250 MG tablet Take 2 tablets (500 mg) on  Day 1,  followed by 1 tablet (250 mg) once daily on Days 2 through 5. 02/23/20 02/28/20  Arnaldo Natal, MD  budesonide-formoterol [facility]) 160-4.5 MCG/ACT inhaler Inhale 2 puffs into the lungs 2 (two) times daily. Patient not taking: Reported on 11/28/2019 09/02/19   Doren Custard, FNP  cetirizine (ZYRTEC) 10 MG tablet Take 1 tablet (10 mg total) by mouth daily. 09/02/19   Doren Custard, FNP  Doxylamine-Pyridoxine 10-10 MG TBEC Take 1 tablet by mouth at bedtime. 11/28/19   Danelle Berry, PA-C  famotidine (PEPCID) 40 MG tablet TAKE 1 TABLET(40 MG) BY MOUTH AT BEDTIME 04/24/19   Alba Cory, MD  hydrOXYzine (VISTARIL) 25 MG capsule Take 25 mg by mouth every 8 (eight) hours as needed for anxiety.    [provider]  imiquimod [facility]) 5 %  cream Apply topically 3 (three) times a week. 11/23/19   Danelle Berry, PA-C  metroNIDAZOLE (FLAGYL) 500 MG tablet Take 1 tablet (500 mg total) by mouth 2 (two) times daily after a meal. 12/14/19   Jene Every, MD  montelukast (SINGULAIR) 10 MG tablet Take 1 tablet (10 mg total) by mouth at bedtime. 09/02/19   Doren Custard, FNP  polyethylene glycol powder (GLYCOLAX/MIRALAX) 17 GM/SCOOP powder Take 17 g by mouth daily. 12/20/18   Alba Cory, MD  Prenatal Vit-Fe Fumarate-FA (PRENATAL MULTIVITAMIN) TABS tablet Take 1 tablet by mouth daily at 12 noon. 04/05/19   Alba Cory, MD   sertraline (ZOLOFT) 100 MG tablet 100 mg.  03/14/19   [provider]  Spacer/Aero-Holding Chambers (OPTICHAMBER [place]-LG MASK) [place] See admin instructions. use with inhaler 08/29/16   [provider]    Allergies Mold extract [trichophyton]  Family History  Problem Relation Age of Onset  . Asthma Father   . Hypertension Father   . ADD / ADHD Brother     Social History Social History   Tobacco Use  . Smoking status: Former Smoker    Packs/day: 0.50    Years: 2.00    Pack years: 1.00    Start date: 03/01/2017  . Smokeless tobacco: Never Used  Vaping Use  . Vaping Use: Every day  . Start date: 03/06/2017  . Last attempt to quit: 08/06/2017  Substance Use Topics  . Alcohol use: No    Alcohol/week: 0.0 standard drinks  . Drug use: Not Currently    Frequency: 1.0 times per week    Types: Marijuana    Comment: presently denies    Review of Systems  Constitutional: No fever/chills Eyes: No visual changes. ENT: No sore throat. Cardiovascular:  chest pain. Respiratory: Some shortness of breath. Gastrointestinal: Lower abdominal pain.  No nausea, no vomiting.  No diarrhea.  No constipation. Genitourinary: Negative for dysuria. Musculoskeletal: Negative for back pain. Skin: Negative for rash. Neurological: Negative for headaches, focal weakness   ____________________________________________   PHYSICAL EXAM:  VITAL SIGNS: ED Triage Vitals  Enc Vitals Group     BP 02/23/20 1553 128/83     Pulse Rate 02/23/20 1553 (!) 117     Resp 02/23/20 1553 18     Temp 02/23/20 1553 98.7 F (37.1 C)     Temp Source 02/23/20 1553 Oral     SpO2 02/23/20 1553 96 %     Weight 02/23/20 1554 186 lb (84.4 kg)     Height 02/23/20 1554 5\' 6"  (1.676 m)     Head Circumference --      Peak Flow --      Pain Score 02/23/20 1554 9     Pain Loc --      Pain Edu? --      Excl. in GC? --     Constitutional: Alert and oriented. Well appearing and in no acute  distress. Eyes: Conjunctivae are normal.  Head: Atraumatic. Nose: No congestion/rhinnorhea. Mouth/Throat: Mucous membranes are moist.  Oropharynx non-erythematous. Neck: No stridor. Cardiovascular: Rapid rate, regular rhythm. Grossly normal heart sounds.  Good peripheral circulation. Respiratory: Normal respiratory effort.  No retractions. Lungs diffuse wheezes and crackles Gastrointestinal: Soft some lower abdominal tenderness to palpation mild. No distention. No abdominal bruits. No CVA tenderness. Genitourinary: Normal perineum and vagina. The cervix is long and closed. There is no cervical motion tenderness no adnexal tenderness. Uterus size feels appropriate for dates Musculoskeletal: No lower extremity tenderness nor edema.  No  joint effusions. Neurologic:  Normal speech and language. No gross focal neurologic deficits are appreciated. No gait instability. Skin:  Skin is warm, dry and intact. No rash noted. Psychiatric: Mood and affect are normal. Speech and behavior are normal.  ____________________________________________   LABS (all labs ordered are listed, but only abnormal results are displayed)  Labs Reviewed  COMPREHENSIVE METABOLIC PANEL - Abnormal; Notable for the following components:      Result Value   BUN <5 (*)    AST 12 (*)    Anion gap 4 (*)    All other components within normal limits  CBC WITH DIFFERENTIAL/PLATELET - Abnormal; Notable for the following components:   HCT 34.9 (*)    All other components within normal limits  FIBRIN DERIVATIVES D-DIMER [facility] ONLY)   ____________________________________________  EKG  EKG read interpreted by me shows normal sinus rhythm rate of 82 normal axis essentially normal EKG ____________________________________________  RADIOLOGY  ED MD interpretation  Official radiology report(s): US OB Limited  Result Date: 02/23/2020 CLINICAL DATA:  Sixteen weeks pregnant with pelvic pain and coughing. EXAM: LIMITED OBSTETRIC  ULTRASOUND FINDINGS: Number of Fetuses: 1 Heart Rate:  155 bpm Movement: Yes Presentation: Variable Placental Location: Fundal Previa: No Amniotic Fluid (Subjective):  Within normal limits. BPD: 3.39 cm 16 w  3 d MATERNAL FINDINGS: Cervix:  Appears closed. Uterus/Adnexae: No abnormality visualized. IMPRESSION: Single live IUP as detailed above. This exam is performed on an emergent basis and does not comprehensively evaluate fetal size, dating, or anatomy; follow-up complete OB US should be considered if further fetal assessment is warranted. Electronically Signed   By: Katherine Mantle M.D.   On: 02/23/2020 18:43    ____________________________________________   PROCEDURES  Procedure(s) performed (including Critical Care):  Procedures   ____________________________________________   INITIAL IMPRESSION / ASSESSMENT AND PLAN / ED COURSE  Patient's D-dimer is negative EKG looks okay blood work looks okay we will give her some Zithromax and an albuterol inhaler.  Her lungs are almost clear after the one neb.  She does not want another one.  I will have her follow-up with her OB doctor.  The ultrasound is also good.  And her cervix is closed and long.             ____________________________________________   FINAL CLINICAL IMPRESSION(S) / ED DIAGNOSES  Final diagnoses:  Bronchitis     ED Discharge Orders         Ordered    albuterol (VENTOLIN HFA) 108 (90 Base) MCG/ACT inhaler  Every 6 hours PRN     Discontinue  Reprint     02/23/20 1929    azithromycin (ZITHROMAX Z-PAK) 250 MG tablet     Discontinue  Reprint     02/23/20 1929           Note:  This document was prepared using Dragon voice recognition software and may include unintentional dictation errors.    Arnaldo Natal, MD 02/23/20 [phone]

## 2020-02-25 ENCOUNTER — Emergency Department
Admission: EM | Admit: 2020-02-25 | Discharge: 2020-02-25 | Disposition: A | Discharge status: Home / Self Care | Payer: Medicaid Other | Attending: Emergency Medicine | Admitting: Emergency Medicine

## 2020-02-25 ENCOUNTER — Other Ambulatory Visit: Payer: Self-pay

## 2020-02-25 ENCOUNTER — Encounter: Payer: Self-pay | Admitting: Intensive Care

## 2020-02-25 DIAGNOSIS — O219 Vomiting of pregnancy, unspecified: Secondary | ICD-10-CM | POA: Diagnosis not present

## 2020-02-25 DIAGNOSIS — Z3A16 16 weeks gestation of pregnancy: Secondary | ICD-10-CM | POA: Diagnosis not present

## 2020-02-25 DIAGNOSIS — Z5321 Procedure and treatment not carried out due to patient leaving prior to being seen by health care provider: Secondary | ICD-10-CM | POA: Insufficient documentation

## 2020-02-25 LAB — COMPREHENSIVE METABOLIC PANEL
ALT: 12 U/L (ref 0–44)
AST: 14 U/L — ABNORMAL LOW (ref 15–41)
Albumin: 4 g/dL (ref 3.5–5.0)
Alkaline Phosphatase: 73 U/L (ref 38–126)
Anion gap: 9 (ref 5–15)
BUN: 6 mg/dL (ref 6–20)
CO2: 21 mmol/L — ABNORMAL LOW (ref 22–32)
Calcium: 9.2 mg/dL (ref 8.9–10.3)
Chloride: 105 mmol/L (ref 98–111)
Creatinine, Ser: 0.66 mg/dL (ref 0.44–1.00)
GFR calc Af Amer: >60 mL/min (ref >60)
GFR calc non Af Amer: >60 mL/min (ref >60)
Glucose, Bld: 89 mg/dL (ref 70–99)
Potassium: 3.8 mmol/L (ref 3.5–5.1)
Sodium: 135 mmol/L (ref 135–145)
Total Bilirubin: 0.8 mg/dL (ref 0.3–1.2)
Total Protein: 7.6 g/dL (ref 6.5–8.1)

## 2020-02-25 LAB — URINALYSIS, COMPLETE (UACMP) WITH MICROSCOPIC
Bacteria, UA: NONE SEEN
Bilirubin Urine: NEGATIVE
Glucose, UA: NEGATIVE mg/dL
Hgb urine dipstick: NEGATIVE
Ketones, ur: 80 mg/dL — AB
Leukocytes,Ua: NEGATIVE
Nitrite: NEGATIVE
Protein, ur: 30 mg/dL — AB
Specific Gravity, Urine: 1.02 (ref 1.005–1.030)
pH: 7 (ref 5.0–8.0)

## 2020-02-25 LAB — CBC
HCT: 35.9 % — ABNORMAL LOW (ref 36.0–46.0)
Hemoglobin: 12.6 g/dL (ref 12.0–15.0)
MCH: 29.8 pg (ref 26.0–34.0)
MCHC: 35.1 g/dL (ref 30.0–36.0)
MCV: 84.9 fL (ref 80.0–100.0)
Platelets: 299 10*3/uL (ref 150–400)
RBC: 4.23 MIL/uL (ref 3.87–5.11)
RDW: 13.6 % (ref 11.5–15.5)
WBC: 6.7 10*3/uL (ref 4.0–10.5)
nRBC: 0 % (ref 0.0–0.2)

## 2020-02-25 LAB — LIPASE, BLOOD: Lipase: 19 U/L (ref 11–51)

## 2020-02-25 NOTE — ED Notes (Signed)
First Nurse Note: Pt to ED stating that she cannot keep anything down. Pt is [redacted] weeks pregnant.

## 2020-02-25 NOTE — ED Notes (Signed)
Pt to front desk stating that she was going to go home and wait until her OB appt to be seen. Pt encourage to stay and be seen. Pt stated that she will wait for her appt. Pt ambulatory out of ED without difficulty or distress.

## 2020-02-25 NOTE — ED Triage Notes (Addendum)
Patient reports she is [redacted]weeks pregnant and has c/o N/V. Unable to keep food/fluids down X2 days. Seen here at [facility] X2 days ago and [place] completed

## 2020-02-26 ENCOUNTER — Encounter: Payer: Self-pay | Admitting: Family Medicine

## 2020-03-19 ENCOUNTER — Ambulatory Visit: Payer: Self-pay | Admitting: Family Medicine

## 2020-03-21 ENCOUNTER — Emergency Department: Payer: Medicaid Other

## 2020-03-21 ENCOUNTER — Encounter: Payer: Self-pay | Admitting: Emergency Medicine

## 2020-03-21 ENCOUNTER — Emergency Department
Admission: EM | Admit: 2020-03-21 | Discharge: 2020-03-21 | Disposition: A | Discharge status: Home / Self Care | Payer: Medicaid Other | Attending: Emergency Medicine | Admitting: Emergency Medicine

## 2020-03-21 ENCOUNTER — Other Ambulatory Visit: Payer: Self-pay

## 2020-03-21 DIAGNOSIS — Z3A20 20 weeks gestation of pregnancy: Secondary | ICD-10-CM | POA: Diagnosis not present

## 2020-03-21 DIAGNOSIS — W108XXA Fall (on) (from) other stairs and steps, initial encounter: Secondary | ICD-10-CM | POA: Diagnosis not present

## 2020-03-21 DIAGNOSIS — R110 Nausea: Secondary | ICD-10-CM | POA: Insufficient documentation

## 2020-03-21 DIAGNOSIS — J45909 Unspecified asthma, uncomplicated: Secondary | ICD-10-CM | POA: Diagnosis not present

## 2020-03-21 DIAGNOSIS — Y939 Activity, unspecified: Secondary | ICD-10-CM | POA: Diagnosis not present

## 2020-03-21 DIAGNOSIS — Z79899 Other long term (current) drug therapy: Secondary | ICD-10-CM | POA: Insufficient documentation

## 2020-03-21 DIAGNOSIS — Y999 Unspecified external cause status: Secondary | ICD-10-CM | POA: Insufficient documentation

## 2020-03-21 DIAGNOSIS — Z9181 History of falling: Secondary | ICD-10-CM

## 2020-03-21 DIAGNOSIS — Y929 Unspecified place or not applicable: Secondary | ICD-10-CM | POA: Diagnosis not present

## 2020-03-21 DIAGNOSIS — O26892 Other specified pregnancy related conditions, second trimester: Secondary | ICD-10-CM | POA: Insufficient documentation

## 2020-03-21 DIAGNOSIS — W19XXXA Unspecified fall, initial encounter: Secondary | ICD-10-CM

## 2020-03-21 DIAGNOSIS — Z87891 Personal history of nicotine dependence: Secondary | ICD-10-CM | POA: Diagnosis not present

## 2020-03-21 DIAGNOSIS — R109 Unspecified abdominal pain: Secondary | ICD-10-CM

## 2020-03-21 DIAGNOSIS — R1084 Generalized abdominal pain: Secondary | ICD-10-CM

## 2020-03-21 NOTE — ED Provider Notes (Signed)
[facility] Emergency Department Provider Note  ____________________________________________  Time seen: Approximately 7:03 PM  I have reviewed the triage vital signs and the nursing notes.   HISTORY  Chief Complaint Fall    HPI Grace [name] is a 19 y.o. female who presents the emergency department complaining of lower abdominal wall pain after a mechanical fall 2 days ago.  Patient states that she fell on some stairs, falling up the stairs.  She did hit her stomach but did not hit her head or pass out.  Initially patient had no complaints.  The next day patient had lower abdominal pain.  She states that she has an appointment tomorrow to see her OB/GYN for routine follow-up and decided to wait till then.  His pain persisted today she presented to the emergency department for evaluation as she is pregnant.  Patient states that she has had no vaginal bleeding or discharge.  No dysuria, polyuria, hematuria.  Patient did have some nausea yesterday but has nausea medication for her OB/GYN.  No hematic emesis.  Patient has denied any abdominal wall lacerations, abrasions, ecchymosis.  No flank pain or back pain.         Past Medical History:  Diagnosis Date  . Acne   . Allergy    ALLERGIC RHINITIS  . Anxiety   . Asthma   . Deliberate self-cutting   . Depression   . Fibrocystic disease of breast   . Head pain   . History of depression 12/20/2015  . History of self-harm   . Knee pain, right   . Primary dysmenorrhea   . Severe myopia of both eyes     Patient Active Problem List   Diagnosis Date Noted  . BV (bacterial vaginosis) 03/02/2019  . Mood disorder (HCC) 11/15/2018  . PTSD (post-traumatic stress disorder) 11/15/2018  . Class 1 obesity due to excess calories without serious comorbidity with body mass index (BMI) of 30.0 to 30.9 in adult 11/15/2018  . Neutropenia (HCC) 08/06/2018  . Major depression, recurrent, chronic (HCC) 08/06/2018  . GAD  (generalized anxiety disorder) 08/06/2018  . Panic attack 01/22/2016  . Chronic allergic rhinitis 12/20/2015  . Asthma, moderate persistent, poorly-controlled 12/20/2015  . 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 LAPAROSCOPY  05/07/2017   Procedure: LAPAROSCOPY DIAGNOSTIC;  Surgeon: Feliberto Gottron Ihor Austin, MD;  Location: [facility] ORS;  Service: Gynecology;;    Prior to Admission medications   Medication Sig Start Date End Date Taking? Authorizing Provider  albuterol (VENTOLIN HFA) 108 (90 Base) MCG/ACT inhaler Inhale 2 puffs into the lungs every 6 (six) hours as needed for wheezing or shortness of breath. 09/02/19   Doren Custard, FNP  albuterol (VENTOLIN HFA) 108 (90 Base) MCG/ACT inhaler Inhale 2 puffs into the lungs every 6 (six) hours as needed for wheezing or shortness of breath. 02/23/20   Arnaldo Natal, MD  ARIPiprazole (ABILIFY) 5 MG tablet Take 1 tablet (5 mg total) by mouth daily. Patient not taking: Reported on 11/28/2019 09/03/18   Doren Custard, FNP  budesonide-formoterol [facility]) 160-4.5 MCG/ACT inhaler Inhale 2 puffs into the lungs 2 (two) times daily. Patient not taking: Reported on 11/28/2019 09/02/19   Doren Custard, FNP  cetirizine (ZYRTEC) 10 MG tablet Take 1 tablet (10 mg total) by mouth daily. 09/02/19   Doren Custard, FNP  Doxylamine-Pyridoxine 10-10 MG TBEC Take 1 tablet by mouth at bedtime. 11/28/19   Danelle Berry, PA-C  famotidine (PEPCID) 40 MG tablet TAKE 1 TABLET(40 MG) BY MOUTH AT BEDTIME 04/24/19   Alba CorySowles, Krichna, MD  hydrOXYzine (VISTARIL) 25 MG capsule Take 25 mg by mouth every 8 (eight) hours as needed for anxiety.    [provider]  imiquimod (ALDARA) 5 % cream Apply topically 3 (three) times a week. 11/23/19   Danelle Berryapia, Leisa, PA-C  metroNIDAZOLE (FLAGYL) 500 MG tablet Take 1 tablet (500 mg total) by mouth 2 (two) times daily after a meal. 12/14/19   Jene EveryKinner, Robert, MD  montelukast (SINGULAIR) 10 MG tablet Take 1  tablet (10 mg total) by mouth at bedtime. 09/02/19   Doren CustardBoyce, Emily E, FNP  polyethylene glycol powder (GLYCOLAX/MIRALAX) 17 GM/SCOOP powder Take 17 g by mouth daily. 12/20/18   Alba CorySowles, Krichna, MD  Prenatal Vit-Fe Fumarate-FA (PRENATAL MULTIVITAMIN) TABS tablet Take 1 tablet by mouth daily at 12 noon. 04/05/19   Alba CorySowles, Krichna, MD  sertraline (ZOLOFT) 100 MG tablet 100 mg.  03/14/19   [provider]  Spacer/Aero-Holding Chambers (OPTICHAMBER Casie-LG MASK) DEVI See admin instructions. use with inhaler 08/29/16   [provider]    Allergies Mold extract [trichophyton]  Family History  Problem Relation Age of Onset  . Asthma Father   . Hypertension Father   . ADD / ADHD Brother     Social History Social History   Tobacco Use  . Smoking status: Former Smoker    Packs/day: 0.50    Years: 2.00    Pack years: 1.00    Start date: 03/01/2017  . Smokeless tobacco: Never Used  . Tobacco comment: using nicotene patches at the moment  Vaping Use  . Vaping Use: Every day  . Start date: 03/06/2017  . Last attempt to quit: 08/06/2017  Substance Use Topics  . Alcohol use: No    Alcohol/week: 0.0 standard drinks  . Drug use: Yes    Frequency: 1.0 times per week    Types: Marijuana    Comment: presently denies     Review of Systems  Constitutional: No fever/chills Eyes: No visual changes. No discharge ENT: No upper respiratory complaints. Cardiovascular: no chest pain. Respiratory: no cough. No SOB. Gastrointestinal: Lower abdominal pain from a fall.  Patient is pregnant.Marland Kitchen.  Positive for nausea and emesis..  No diarrhea.  No constipation. Genitourinary: Negative for dysuria. No hematuria.  No vaginal bleeding or discharge. Musculoskeletal: Negative for musculoskeletal pain. Skin: Negative for rash, abrasions, lacerations, ecchymosis. Neurological: Negative for headaches, focal weakness or numbness. 10-point ROS otherwise  negative.  ____________________________________________   PHYSICAL EXAM:  VITAL SIGNS: ED Triage Vitals  Enc Vitals Group     BP 03/21/20 1645 120/70     Pulse Rate 03/21/20 1645 104     Resp 03/21/20 1645 16     Temp 03/21/20 1645 98.1 F (36.7 C)     Temp Source 03/21/20 1645 Oral     SpO2 03/21/20 1645 98 %     Weight 03/21/20 1643 181 lb (82.1 kg)     Height 03/21/20 1643 5\' 6"  (1.676 m)     Head Circumference --      Peak Flow --      Pain Score 03/21/20 1643 8     Pain Loc --      Pain Edu? --      Excl. in GC? --      Constitutional: Alert and oriented. Well appearing and in no acute distress. Eyes: Conjunctivae are normal. PERRL. EOMI. Head: Atraumatic. ENT:  Ears:       Nose: No congestion/rhinnorhea.      Mouth/Throat: Mucous membranes are moist.  Neck: No stridor.    Cardiovascular: Normal rate, regular rhythm. Normal S1 and S2.  Good peripheral circulation. Respiratory: Normal respiratory effort without tachypnea or retractions. Lungs CTAB. Good air entry to the bases with no decreased or absent breath sounds. Gastrointestinal: Visualization of the external abdominal wall reveals no abrasions, lacerations, ecchymosis.  Gravid abdomen.  Bowel sounds 4 quadrants.  Soft to palpation all quadrants.  Patient is diffusely tender to palpation in the lower abdominal region/suprapubic region.  No guarding or rigidity. No palpable masses.  Fundus is palpable.  No distention. No CVA tenderness. Musculoskeletal: Full range of motion to all extremities. No gross deformities appreciated. Neurologic:  Normal speech and language. No gross focal neurologic deficits are appreciated.  Skin:  Skin is warm, dry and intact. No rash noted. Psychiatric: Mood and affect are normal. Speech and behavior are normal. Patient exhibits appropriate insight and judgement.   ____________________________________________   LABS (all labs ordered are listed, but only abnormal results are  displayed)  Labs Reviewed - No data to display ____________________________________________  EKG   ____________________________________________  RADIOLOGY I personally viewed and evaluated these images as part of my medical decision making, as well as reviewing the written report by the radiologist.  US OB Limited  Result Date: 03/21/2020 CLINICAL DATA:  Pregnant patient in second trimester pregnancy with recent fall and abdominal pain. EXAM: LIMITED OBSTETRIC ULTRASOUND COMPARISON:  Obstetric ultrasound 02/23/2020 FINDINGS: Number of Fetuses: 1 Heart Rate:  144 bpm Movement: Yes Presentation: Breech Placental Location: Posterior fundal. Previa: No. Amniotic Fluid (Subjective):  Within normal limits. BPD: 4.6 cm 20 w  0 d MATERNAL FINDINGS: Cervix:  Appears closed. Uterus/Adnexae: No abnormality visualized. IMPRESSION: Single live intrauterine pregnancy estimated gestational age [redacted] weeks 0 days based on biparietal diameter. No abnormalities are seen. This exam is performed on an emergent basis and does not comprehensively evaluate fetal size, dating, or anatomy; follow-up complete OB US should be considered if further fetal assessment is warranted. Electronically Signed   By: Narda Rutherford M.D.   On: 03/21/2020 18:12    ____________________________________________    PROCEDURES  Procedure(s) performed:    Procedures    Medications - No data to display   ____________________________________________   INITIAL IMPRESSION / ASSESSMENT AND PLAN / ED COURSE  Pertinent labs & imaging results that were available during my care of the patient were reviewed by me and considered in my medical decision making (see chart for details).  Review of the [place] CSRS was performed in accordance of the [facility] prior to dispensing any controlled drugs.           Patient's diagnosis is consistent with fall, lower abdominal pain, pregnant.  Patient presented to emergency department concerned for  lower abdominal pain in the setting of pregnancy after a fall.  Patient tripped, had a mechanical fall hitting her abdomen 2 days ago.  No pain initially.  The next day patient experienced lower abdominal pain which continued today.  She has had some nausea and emesis but no diarrhea.  No vaginal bleeding or discharge.  No dysuria, polyuria, hematuria.  Exam was reassuring with no acute traumatic abdominal wall findings.  Ultrasound revealed no acute findings with pregnancy.  At this time patient is to continue antiemetic use at home, Tylenol for pain and follow-up with OB tomorrow.  No indication for further work-up at this time.Marland Kitchen  Patient is given ED precautions to return to the ED for any worsening or new symptoms.     ____________________________________________  FINAL CLINICAL IMPRESSION(S) / ED DIAGNOSES  Final diagnoses:  Fall, initial encounter  Generalized abdominal pain  [redacted] weeks gestation of pregnancy      NEW MEDICATIONS STARTED DURING THIS VISIT:  ED Discharge Orders    None          This chart was dictated using voice recognition software/Dragon. Despite best efforts to proofread, errors can occur which can change the meaning. Any change was purely unintentional.    Racheal Patches, PA-C 03/21/20 1918    Delton Prairie, MD 03/21/20 2251

## 2020-03-21 NOTE — ED Triage Notes (Signed)
Fall two days ago.  C/O abdominal pain.  Larey Seat forward, walking up steps.  AAOx3.  Skin warm and dry. NAD

## 2020-03-21 NOTE — ED Notes (Signed)
Pt states she has abdomen pain that is worse when moving. Pt states having nv. Denies diarrhea.

## 2020-03-22 ENCOUNTER — Telehealth: Payer: Self-pay | Admitting: *Deleted

## 2020-03-22 NOTE — Telephone Encounter (Signed)
Contacted pt to complete transition of care assessment:  Transition Care Management Follow-up Telephone Call   [facility] Managed Care Transition Call Status:MM [facility] Call Made   Date of discharge and from where: [facility], 03/21/20   How have you been since you were released from the hospital? "ok"   Any questions or concerns?  no  Items Reviewed:  Did the pt receive and understand the discharge instructions provided? Yes   Medications obtained and verified? No   Any new allergies since your discharge? No   Dietary orders reviewed? No  Do you have support at home?  yes Functional Questionnaire: (I = Independent and D = Dependent)  ADLs: Independent Bathing/Dressing:Independent Meal Prep: Independent Eating: Independent Maintaining continence: Independent Transferring/Ambulation: Independent Managing Meds: Independent Follow up appointments reviewed:  PCP Hospital f/u appt confirmed? n/a   Specialist Hospital f/u appt confirmed?  Pt states she had follow up appt with OB/GYN today, and she was told everything was ok;  Are transportation arrangements needed? No   If their condition worsens, is the pt aware to call PCP or go to the EmergencyDept.? yes Was the patient provided with contact information for the PCP's office or ED? yes  Was to pt encouraged to call back with questions or concerns? yes  Burnard Bunting, RN, BSN, CCRN [facility] [phone]

## 2020-03-28 ENCOUNTER — Other Ambulatory Visit: Payer: Self-pay

## 2020-03-28 ENCOUNTER — Observation Stay
Admission: EM | Admit: 2020-03-28 | Discharge: 2020-03-28 | Disposition: A | Discharge status: Home / Self Care | Payer: Medicaid Other | Attending: Certified Nurse Midwife | Admitting: Certified Nurse Midwife

## 2020-03-28 DIAGNOSIS — Z3A21 21 weeks gestation of pregnancy: Secondary | ICD-10-CM | POA: Insufficient documentation

## 2020-03-28 DIAGNOSIS — Z87891 Personal history of nicotine dependence: Secondary | ICD-10-CM | POA: Insufficient documentation

## 2020-03-28 DIAGNOSIS — J45909 Unspecified asthma, uncomplicated: Secondary | ICD-10-CM | POA: Insufficient documentation

## 2020-03-28 DIAGNOSIS — O4192X1 Disorder of amniotic fluid and membranes, unspecified, second trimester, fetus 1: Secondary | ICD-10-CM | POA: Diagnosis present

## 2020-03-28 DIAGNOSIS — N898 Other specified noninflammatory disorders of vagina: Secondary | ICD-10-CM | POA: Diagnosis present

## 2020-03-28 DIAGNOSIS — O26899 Other specified pregnancy related conditions, unspecified trimester: Secondary | ICD-10-CM | POA: Diagnosis present

## 2020-03-28 LAB — URINALYSIS, COMPLETE (UACMP) WITH MICROSCOPIC
Bilirubin Urine: NEGATIVE
Glucose, UA: NEGATIVE mg/dL
Hgb urine dipstick: NEGATIVE
Ketones, ur: NEGATIVE mg/dL
Leukocytes,Ua: NEGATIVE
Nitrite: NEGATIVE
Protein, ur: NEGATIVE mg/dL
Specific Gravity, Urine: 1.006 (ref 1.005–1.030)
pH: 7 (ref 5.0–8.0)

## 2020-03-28 LAB — WET PREP, GENITAL
Clue Cells Wet Prep HPF POC: NONE SEEN
Sperm: NONE SEEN
Trich, Wet Prep: NONE SEEN

## 2020-03-28 LAB — RUPTURE OF MEMBRANE (ROM)PLUS: Rom Plus: NEGATIVE

## 2020-03-28 MED ORDER — ACETAMINOPHEN 500 MG PO TABS
ORAL_TABLET | ORAL | Status: AC
  Filled 2020-03-28: qty 2

## 2020-03-28 MED ORDER — ACETAMINOPHEN 500 MG PO TABS
1000.0000 mg | ORAL_TABLET | Freq: Four times a day (QID) | ORAL | Status: DC | PRN
  Administered 2020-03-28: 1000 mg via ORAL

## 2020-03-28 MED ORDER — FLUCONAZOLE 50 MG PO TABS
150.0000 mg | ORAL_TABLET | Freq: Once | ORAL | Status: AC
  Administered 2020-03-28: 150 mg via ORAL
  Filled 2020-03-28: qty 1

## 2020-03-28 NOTE — OB Triage Note (Signed)
Pt presents for LOF. Pt reports she "felt wet" when she woke up and when she stood up she felt fluid running down her leg. Pt reports she went to the bathroom and was able to pee. Pt reports she went back to bed and felt the fluid again. +FM. Pt states she had intercourse last PM. VSS. FHT 145.

## 2020-03-28 NOTE — Discharge Summary (Signed)
Grace [name] is a 19 y.o. female. She is at [redacted]w[redacted]d gestation. Patient's last menstrual period was 11/05/2019 (exact date). Estimated Date of Delivery: 08/11/20  Prenatal care site: [facility]  Chief complaint: leakage of fluid Location: vagina Onset/timing: today Duration: intermittent, x2 Quality: leakage of clear fluid out of her vagina and down her leg Aggravating or alleviating conditions: none Associated signs/symptoms: cramping Context: Ellarae reports leakage of fluid from her vagina twice this morning, the last around 0730. She reports clear fluid that ran down her leg. She reports last intercourse last night. She reports no further leakage since then. She is also having some pelvic cramping.   S: Resting comfortably.   She reports:  -active fetal movement -no further leakage of fluid -no vaginal bleeding   Maternal Medical History:   Past Medical History:  Diagnosis Date  . Acne   . Allergy    ALLERGIC RHINITIS  . Anxiety   . Asthma   . Deliberate self-cutting   . Depression   . Fibrocystic disease of breast   . Head pain   . History of depression 12/20/2015  . History of self-harm   . Knee pain, right   . Primary dysmenorrhea   . Severe myopia of both eyes     Past Surgical History:  Procedure Laterality Date  . LAPAROSCOPY  05/07/2017   Procedure: LAPAROSCOPY DIAGNOSTIC;  Surgeon: Feliberto Gottron Ihor Austin, MD;  Location: [facility] ORS;  Service: Gynecology;;    Allergies  Allergen Reactions  . Mold Extract [Trichophyton] Other (See Comments)    Causes chest pain Per allergy test    Prior to Admission medications   Medication Sig Start Date End Date Taking? Authorizing Provider  albuterol (VENTOLIN HFA) 108 (90 Base) MCG/ACT inhaler Inhale 2 puffs into the lungs every 6 (six) hours as needed for wheezing or shortness of breath. 09/02/19  Yes Doren Custard, FNP  albuterol (VENTOLIN HFA) 108 (90 Base) MCG/ACT inhaler Inhale 2 puffs into the lungs  every 6 (six) hours as needed for wheezing or shortness of breath. 02/23/20  Yes Arnaldo Natal, MD  cetirizine (ZYRTEC) 10 MG tablet Take 1 tablet (10 mg total) by mouth daily. 09/02/19  Yes Doren Custard, FNP  ARIPiprazole (ABILIFY) 5 MG tablet Take 1 tablet (5 mg total) by mouth daily. Patient not taking: Reported on 11/28/2019 09/03/18   Doren Custard, FNP  budesonide-formoterol [facility]) 160-4.5 MCG/ACT inhaler Inhale 2 puffs into the lungs 2 (two) times daily. Patient not taking: Reported on 11/28/2019 09/02/19   Doren Custard, FNP  Doxylamine-Pyridoxine 10-10 MG TBEC Take 1 tablet by mouth at bedtime. 11/28/19   Danelle Berry, PA-C  famotidine (PEPCID) 40 MG tablet TAKE 1 TABLET(40 MG) BY MOUTH AT BEDTIME 04/24/19   Alba Cory, MD  hydrOXYzine (VISTARIL) 25 MG capsule Take 25 mg by mouth every 8 (eight) hours as needed for anxiety.    [provider]  imiquimod (ALDARA) 5 % cream Apply topically 3 (three) times a week. 11/23/19   Danelle Berry, PA-C  metroNIDAZOLE (FLAGYL) 500 MG tablet Take 1 tablet (500 mg total) by mouth 2 (two) times daily after a meal. 12/14/19   Jene Every, MD  montelukast (SINGULAIR) 10 MG tablet Take 1 tablet (10 mg total) by mouth at bedtime. 09/02/19   Doren Custard, FNP  polyethylene glycol powder (GLYCOLAX/MIRALAX) 17 GM/SCOOP powder Take 17 g by mouth daily. 12/20/18   Alba Cory, MD  Prenatal Vit-Fe Fumarate-FA (PRENATAL MULTIVITAMIN)  TABS tablet Take 1 tablet by mouth daily at 12 noon. 04/05/19   Alba Cory, MD  sertraline (ZOLOFT) 100 MG tablet 100 mg.  03/14/19   [provider]  Spacer/Aero-Holding Chambers (OPTICHAMBER Maelle-LG MASK) DEVI See admin instructions. use with inhaler 08/29/16   [provider]     Social History: She  reports that she has quit smoking. She started smoking about 3 years ago. She has a 1.00 pack-year smoking history. She has never used smokeless tobacco. She reports current drug use. Frequency: 7.00  times per week. Drug: Marijuana. She reports that she does not drink alcohol.  Family History: family history includes ADD / ADHD in her brother; Asthma in her father; Hypertension in her father.   Review of Systems: A full review of systems was performed and negative except as noted in the HPI.     O:  BP (!) 108/57   Pulse 85   Temp 98.3 F (36.8 C) (Oral)   Resp 17   LMP 11/05/2019 (Exact Date)  Results for orders placed or performed during the hospital encounter of 03/28/20 (from the past 48 hour(s))  Wet prep, genital   Collection Time: 03/28/20  9:51 AM  Result Value Ref Range   Yeast Wet Prep HPF POC PRESENT (A) NONE SEEN   Trich, Wet Prep NONE SEEN NONE SEEN   Clue Cells Wet Prep HPF POC NONE SEEN NONE SEEN   WBC, Wet Prep HPF POC MANY (A) NONE SEEN   Sperm NONE SEEN   ROM Plus ([facility] only)   Collection Time: 03/28/20  9:51 AM  Result Value Ref Range   Rom Plus NEGATIVE   Urinalysis, Complete w Microscopic   Collection Time: 03/28/20  9:52 AM  Result Value Ref Range   Color, Urine YELLOW (A) YELLOW   APPearance CLEAR (A) CLEAR   Specific Gravity, Urine 1.006 1.005 - 1.030   pH 7.0 5.0 - 8.0   Glucose, UA NEGATIVE NEGATIVE mg/dL   Hgb urine dipstick NEGATIVE NEGATIVE   Bilirubin Urine NEGATIVE NEGATIVE   Ketones, ur NEGATIVE NEGATIVE mg/dL   Protein, ur NEGATIVE NEGATIVE mg/dL   Nitrite NEGATIVE NEGATIVE   Leukocytes,Ua NEGATIVE NEGATIVE   RBC / HPF 0-5 0 - 5 RBC/hpf   WBC, UA 0-5 0 - 5 WBC/hpf   Bacteria, UA RARE (A) NONE SEEN   Squamous Epithelial / LPF 0-5 0 - 5     Constitutional: NAD, AAOx3  HE/ENT: extraocular movements grossly intact, moist mucous membranes CV: RRR PULM: normal respiratory effort, CTABL     Abd: gravid, non-tender, non-distended, soft      Ext: Non-tender, Nonedmeatous   Psych: mood appropriate, speech normal Pelvic: SSE: no pooling of fluid, no fluid from cervix with Valsalva, cervix visually closed and long, negative  ferning  Monitoring: FHT by doppler: 145bpm Toco: quiet Time: 2 hours  A/P: 19 y.o. [redacted]w[redacted]d here for antenatal surveillance during pregnancy.  Principle diagnosis: Yeast infection during pregnancy  Labor  Not present  Fetal Wellbeing  Reactive NST, reassuring for GA  Yeast infection  Wet prep with yeast. Treated with fluconazole 150mg  PO x1.   ROM Plus negative. No evidence of UTI on UA.   Cramping  Toco quiet, cervix closed.   Advised adequate hydration, pregnancy support band, warm baths/showers, and Tylenol PRN.   D/c home stable, precautions reviewed, follow-up as scheduled.    03/28/2020 11:10 AM  ----- 05/28/2020, CNM Certified Nurse Midwife [facility], Department of OB/GYN  [facility]

## 2020-04-02 ENCOUNTER — Ambulatory Visit: Payer: Medicaid Other | Admitting: Family Medicine

## 2020-04-03 ENCOUNTER — Emergency Department
Admission: EM | Admit: 2020-04-03 | Discharge: 2020-04-03 | Disposition: A | Discharge status: Home / Self Care | Payer: Medicaid Other | Attending: Student in an Organized Health Care Education/Training Program | Admitting: Student in an Organized Health Care Education/Training Program

## 2020-04-03 ENCOUNTER — Other Ambulatory Visit: Payer: Self-pay

## 2020-04-03 ENCOUNTER — Encounter: Payer: Self-pay | Admitting: Emergency Medicine

## 2020-04-03 ENCOUNTER — Emergency Department: Payer: Medicaid Other

## 2020-04-03 DIAGNOSIS — O219 Vomiting of pregnancy, unspecified: Secondary | ICD-10-CM | POA: Insufficient documentation

## 2020-04-03 DIAGNOSIS — Z79899 Other long term (current) drug therapy: Secondary | ICD-10-CM | POA: Insufficient documentation

## 2020-04-03 DIAGNOSIS — U071 COVID-19: Secondary | ICD-10-CM

## 2020-04-03 DIAGNOSIS — O99512 Diseases of the respiratory system complicating pregnancy, second trimester: Secondary | ICD-10-CM | POA: Insufficient documentation

## 2020-04-03 DIAGNOSIS — J4521 Mild intermittent asthma with (acute) exacerbation: Secondary | ICD-10-CM | POA: Diagnosis not present

## 2020-04-03 DIAGNOSIS — R109 Unspecified abdominal pain: Secondary | ICD-10-CM | POA: Insufficient documentation

## 2020-04-03 DIAGNOSIS — J069 Acute upper respiratory infection, unspecified: Secondary | ICD-10-CM | POA: Diagnosis not present

## 2020-04-03 DIAGNOSIS — O26892 Other specified pregnancy related conditions, second trimester: Secondary | ICD-10-CM | POA: Diagnosis not present

## 2020-04-03 DIAGNOSIS — Z20822 Contact with and (suspected) exposure to covid-19: Secondary | ICD-10-CM | POA: Diagnosis not present

## 2020-04-03 DIAGNOSIS — Z3A21 21 weeks gestation of pregnancy: Secondary | ICD-10-CM

## 2020-04-03 DIAGNOSIS — Z7952 Long term (current) use of systemic steroids: Secondary | ICD-10-CM | POA: Insufficient documentation

## 2020-04-03 DIAGNOSIS — R197 Diarrhea, unspecified: Secondary | ICD-10-CM | POA: Insufficient documentation

## 2020-04-03 DIAGNOSIS — Z87891 Personal history of nicotine dependence: Secondary | ICD-10-CM | POA: Diagnosis not present

## 2020-04-03 DIAGNOSIS — R112 Nausea with vomiting, unspecified: Secondary | ICD-10-CM

## 2020-04-03 LAB — COMPREHENSIVE METABOLIC PANEL
ALT: 14 U/L (ref 0–44)
AST: 20 U/L (ref 15–41)
Albumin: 3.5 g/dL (ref 3.5–5.0)
Alkaline Phosphatase: 66 U/L (ref 38–126)
Anion gap: 12 (ref 5–15)
BUN: <5 mg/dL — ABNORMAL LOW (ref 6–20)
CO2: 20 mmol/L — ABNORMAL LOW (ref 22–32)
Calcium: 8.8 mg/dL — ABNORMAL LOW (ref 8.9–10.3)
Chloride: 102 mmol/L (ref 98–111)
Creatinine, Ser: 0.67 mg/dL (ref 0.44–1.00)
GFR calc Af Amer: >60 mL/min (ref >60)
GFR calc non Af Amer: >60 mL/min (ref >60)
Glucose, Bld: 115 mg/dL — ABNORMAL HIGH (ref 70–99)
Potassium: 3.3 mmol/L — ABNORMAL LOW (ref 3.5–5.1)
Sodium: 134 mmol/L — ABNORMAL LOW (ref 135–145)
Total Bilirubin: 0.7 mg/dL (ref 0.3–1.2)
Total Protein: 6.9 g/dL (ref 6.5–8.1)

## 2020-04-03 LAB — URINALYSIS, COMPLETE (UACMP) WITH MICROSCOPIC
Bacteria, UA: NONE SEEN
Bilirubin Urine: NEGATIVE
Glucose, UA: NEGATIVE mg/dL
Hgb urine dipstick: NEGATIVE
Ketones, ur: NEGATIVE mg/dL
Leukocytes,Ua: NEGATIVE
Nitrite: NEGATIVE
Protein, ur: NEGATIVE mg/dL
Specific Gravity, Urine: 1.011 (ref 1.005–1.030)
pH: 7 (ref 5.0–8.0)

## 2020-04-03 LAB — LIPASE, BLOOD: Lipase: 21 U/L (ref 11–51)

## 2020-04-03 LAB — CBC
HCT: 32 % — ABNORMAL LOW (ref 36.0–46.0)
Hemoglobin: 11.1 g/dL — ABNORMAL LOW (ref 12.0–15.0)
MCH: 30 pg (ref 26.0–34.0)
MCHC: 34.7 g/dL (ref 30.0–36.0)
MCV: 86.5 fL (ref 80.0–100.0)
Platelets: 236 10*3/uL (ref 150–400)
RBC: 3.7 MIL/uL — ABNORMAL LOW (ref 3.87–5.11)
RDW: 13.4 % (ref 11.5–15.5)
WBC: 8.6 10*3/uL (ref 4.0–10.5)
nRBC: 0 % (ref 0.0–0.2)

## 2020-04-03 LAB — SARS CORONAVIRUS 2 BY RT PCR (HOSPITAL ORDER, PERFORMED IN ~~LOC~~ HOSPITAL LAB): SARS Coronavirus 2: NEGATIVE

## 2020-04-03 MED ORDER — PREDNISONE 10 MG PO TABS: 40.0000 mg | ORAL_TABLET | Freq: Every day | ORAL | 0 refills | Status: AC

## 2020-04-03 MED ORDER — ALBUTEROL SULFATE (2.5 MG/3ML) 0.083% IN NEBU
2.5000 mg | INHALATION_SOLUTION | Freq: Once | RESPIRATORY_TRACT | Status: AC
  Administered 2020-04-03: 2.5 mg via RESPIRATORY_TRACT
  Filled 2020-04-03: qty 3

## 2020-04-03 MED ORDER — PREDNISONE 20 MG PO TABS
40.0000 mg | ORAL_TABLET | Freq: Once | ORAL | Status: AC
  Administered 2020-04-03: 40 mg via ORAL
  Filled 2020-04-03: qty 2

## 2020-04-03 MED ORDER — ALBUTEROL SULFATE HFA 108 (90 BASE) MCG/ACT IN AERS
2.0000 | INHALATION_SPRAY | Freq: Four times a day (QID) | RESPIRATORY_TRACT | 2 refills | Status: DC | PRN
Start: 2020-04-03 — End: 2021-04-27

## 2020-04-03 NOTE — ED Provider Notes (Signed)
[facility] Emergency Department Provider Note  ____________________________________________   First MD Initiated Contact with Patient 04/03/20 [phone]     (approximate)  I have reviewed the triage vital signs and the nursing notes.   HISTORY  Chief Complaint URI and Emesis   HPI Grace [name] is a 19 y.o. female who is [redacted]w[redacted]d gestation presenting for cough, congestion, vomiting, diarrhea, and abdominal pain that has been present for last 36-48 hrs. Patient has not had any known covid exposures. Denies travel. Reports being fully vaccinated in March or April with 2-dose vaccine. Reports difficulty breathing at night with wheezing and chest tightness and discomfort. Patient has a hx of asthma, but her inhaler is not offering relief. Doesn't have a thermometer but reports feeling hot yesterday and took OTC tylenol. Has tried mucinex for cough/congestion symptoms without relief. Reports vomiting x 3 and diarrhea, with associated abdominal pain. Pain is described as sharp on the bilateral lower sides, with radiation to the pelvic area. She also repeat a midline "squeezing" pain that is intermittent. She reports she is feeling baby move less today than in week prior. Denies vaginal bleeding.     Past Medical History:  Diagnosis Date  . Acne   . Allergy    ALLERGIC RHINITIS  . Anxiety   . Asthma   . Deliberate self-cutting   . Depression   . Fibrocystic disease of breast   . Head pain   . History of depression 12/20/2015  . History of self-harm   . Knee pain, right   . Primary dysmenorrhea   . Severe myopia of both eyes     Patient Active Problem List   Diagnosis Date Noted  . Vaginal discharge during pregnancy 03/28/2020  . BV (bacterial vaginosis) 03/02/2019  . Mood disorder (HCC) 11/15/2018  . PTSD (post-traumatic stress disorder) 11/15/2018  . Class 1 obesity due to excess calories without serious comorbidity with body mass index (BMI) of 30.0 to 30.9  in adult 11/15/2018  . Neutropenia (HCC) 08/06/2018  . Major depression, recurrent, chronic (HCC) 08/06/2018  . GAD (generalized anxiety disorder) 08/06/2018  . Panic attack 01/22/2016  . Chronic allergic rhinitis 12/20/2015  . Asthma, moderate persistent, poorly-controlled 12/20/2015  . 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 LAPAROSCOPY  05/07/2017   Procedure: LAPAROSCOPY DIAGNOSTIC;  Surgeon: Feliberto Gottron Ihor Austin, MD;  Location: [facility] ORS;  Service: Gynecology;;    Prior to Admission medications   Medication Sig Start Date End Date Taking? Authorizing Provider  albuterol (VENTOLIN HFA) 108 (90 Base) MCG/ACT inhaler Inhale 2 puffs into the lungs every 6 (six) hours as needed for wheezing or shortness of breath. 04/03/20   Grace Chris, PA  cetirizine (ZYRTEC) 10 MG tablet Take 1 tablet (10 mg total) by mouth daily. 09/02/19   Doren Custard, FNP  Doxylamine-Pyridoxine 10-10 MG TBEC Take 1 tablet by mouth at bedtime. 11/28/19   Danelle Berry, PA-C  famotidine (PEPCID) 40 MG tablet TAKE 1 TABLET(40 MG) BY MOUTH AT BEDTIME 04/24/19   Alba Cory, MD  hydrOXYzine (VISTARIL) 25 MG capsule Take 25 mg by mouth every 8 (eight) hours as needed for anxiety.    [provider]  imiquimod (ALDARA) 5 % cream Apply topically 3 (three) times a week. 11/23/19   Danelle Berry, PA-C  montelukast (SINGULAIR) 10 MG tablet Take 1 tablet (10 mg total) by mouth at bedtime. 09/02/19   Doren Custard, FNP  polyethylene  glycol powder (GLYCOLAX/MIRALAX) 17 GM/SCOOP powder Take 17 g by mouth daily. 12/20/18   Alba Cory, MD  predniSONE (DELTASONE) 10 MG tablet Take 4 tablets (40 mg total) by mouth daily for 3 days. 04/03/20 04/06/20  Grace Chris, PA  Prenatal Vit-Fe Fumarate-FA (PRENATAL MULTIVITAMIN) TABS tablet Take 1 tablet by mouth daily at 12 noon. 04/05/19   Alba Cory, MD  Spacer/Aero-Holding Chambers (OPTICHAMBER Jahaira-LG MASK) [facility]  See admin instructions. use with inhaler 08/29/16   [provider]    Allergies Mold extract [trichophyton]  Family History  Problem Relation Age of Onset  . Asthma Father   . Hypertension Father   . ADD / ADHD Brother     Social History Social History   Tobacco Use  . Smoking status: Former Smoker    Packs/day: 0.50    Years: 2.00    Pack years: 1.00    Start date: 03/01/2017  . Smokeless tobacco: Never Used  . Tobacco comment: using nicotene patches at the moment  Vaping Use  . Vaping Use: Every day  . Start date: 03/06/2017  . Last attempt to quit: 08/06/2017  Substance Use Topics  . Alcohol use: No    Alcohol/week: 0.0 standard drinks  . Drug use: Yes    Frequency: 7.0 times per week    Types: Marijuana    Review of Systems Constitutional: + Malaise, + subjective fever Eyes: No visual changes. ENT: + Sore throat, +runny nose, +nasal congestion, +cough Cardiovascular: + chest tightness Respiratory: +wheezing, denies SOB Gastrointestinal: + Intermittent abdominal pain across lower abdomen. +nausea, +vomitting,  Genitourinary: Negative for dysuria. +pregnancy. +"squeezing" pain. - vaginal bleeding Musculoskeletal: Negative for back pain. Skin: Negative for rash. Neurological: Negative for headaches, focal weakness or numbness.   ____________________________________________   PHYSICAL EXAM:  VITAL SIGNS: ED Triage Vitals  Enc Vitals Group     BP 04/03/20 0706 (!) 149/84     Pulse Rate 04/03/20 0706 96     Resp 04/03/20 0706 18     Temp 04/03/20 0706 98.6 F (37 C)     Temp Source 04/03/20 0706 Oral     SpO2 04/03/20 0706 97 %     Weight 04/03/20 0707 187 lb (84.8 kg)     Height 04/03/20 0707 5\' 6"  (1.676 m)     Head Circumference --      Peak Flow --      Pain Score 04/03/20 0706 8     Pain Loc --      Pain Edu? --      Excl. in GC? --    Constitutional: Alert and oriented. Ill appearing, in no acute distress. Eyes: Conjunctivae are  normal. PERRL. EOMI. Head: Atraumatic. Nose: Nasal turbinates are erythematous with clear discharge. Mouth/Throat: Mucous membranes are moist.  Oropharynx erythematous. Neck: No stridor. Hematological/Lymphatic/Immunilogical: No cervical lymphadenopathy. Cardiovascular: Normal rate, regular rhythm. Grossly normal heart sounds.  Good peripheral circulation. Respiratory: Normal respiratory effort.  No retractions. Inspiratory wheezing present throughout bilateral lungs, most prominent in the bases.  Gastrointestinal: Soft, tender to lower abdomen bilaterally. Normal fundal height.  Genitourinary: Exam deferred. Musculoskeletal: No lower extremity tenderness nor edema.  No joint effusions. Neurologic:  Normal speech and language. No gross focal neurologic deficits are appreciated. No gait instability. Skin:  Skin is warm, dry and intact. No rash noted. Psychiatric: Mood and affect are normal. Speech and behavior are normal.  ____________________________________________   LABS (all labs ordered are listed, but only abnormal results are displayed)  Labs Reviewed  COMPREHENSIVE METABOLIC PANEL - Abnormal; Notable for the following components:      Result Value   Sodium 134 (*)    Potassium 3.3 (*)    CO2 20 (*)    Glucose, Bld 115 (*)    BUN <5 (*)    Calcium 8.8 (*)    All other components within normal limits  CBC - Abnormal; Notable for the following components:   RBC 3.70 (*)    Hemoglobin 11.1 (*)    HCT 32.0 (*)    All other components within normal limits  URINALYSIS, COMPLETE (UACMP) WITH MICROSCOPIC - Abnormal; Notable for the following components:   Color, Urine YELLOW (*)    APPearance CLEAR (*)    All other components within normal limits  SARS CORONAVIRUS 2 BY RT PCR (HOSPITAL ORDER, PERFORMED IN [facility])  LIPASE, BLOOD   ____________________________________________  EKG  EKG read by heart station doctor.    ____________________________________________  RADIOLOGY  Patient offered chest X-ray, but patient refused.  ED MD interpretation:    Official radiology report(s): No results found.  ____________________________________________   PROCEDURES  Procedure(s) performed (including Critical Care):  Procedures   ____________________________________________   INITIAL IMPRESSION / ASSESSMENT AND PLAN / ED COURSE  As part of my medical decision making, I reviewed the following data within the electronic MEDICAL RECORD NUMBER Notes from prior ED visits        Grace [name] is a 11 yo pregnant female ([redacted]w[redacted]d gestation) presenting for symptoms of cough, congestion, chest tightness, wheezing, diarrhea, vomiting, abdominal pain. Labs initiated in triage. Fetal heart tones normal in triage. EKG performed in triage. Exam concerning for possible COVID-19 with asthma exacerbation. Patient refused chest XR. Discussed with Dr. Roxan Hockey, who will call L&D re: patient.     COVID negative, given prednisone 40 mg by Dr. Roxan Hockey. Vitals stable and patient feels improved breathing following prednisone treatment. Still endorsing abdominal pain, concerning for contractions. Discussed with Heloise Ochoa, CNM who recommended eval in L&D after clearance from ER. Given normal vitals, negative COVID, and improvement with prednisone, feel the patient is stable for discharge from ER. Will discharge with 3 days of prednisone 40mg  and albuterol refill per Dr. recommendation.   Grace [name] was evaluated in Emergency Department on 04/03/2020 for the symptoms described in the history of present illness. She was evaluated in the context of the global COVID-19 pandemic, which necessitated consideration that the patient might be at risk for infection with the SARS-CoV-2 virus that causes COVID-19. Institutional protocols and algorithms that pertain to the evaluation of patients at risk for COVID-19 are in a state of  rapid change based on information released by regulatory bodies including the CDC and federal and state organizations. These policies and algorithms were followed during the patient's care in the ED.  ____________________________________________   FINAL CLINICAL IMPRESSION(S) / ED DIAGNOSES  Final diagnoses:  Viral upper respiratory tract infection  [redacted] weeks gestation of pregnancy  Nausea vomiting and diarrhea  Abdominal pain affecting pregnancy  Mild intermittent asthma with exacerbation     ED Discharge Orders         Ordered    albuterol (VENTOLIN HFA) 108 (90 Base) MCG/ACT inhaler  Every 6 hours PRN     Discontinue  Reprint     04/03/20 1155    predniSONE (DELTASONE) 10 MG tablet  Daily     Discontinue  Reprint     04/03/20 1155  Patient staffed with Willy EddyPatrick Robinson, MD.  Note:  This document was prepared using Dragon voice recognition software and may include unintentional dictation errors.    Grace [name], Grace Purington J, PA 04/03/20 1224    Willy Eddyobinson, Patrick, MD 04/03/20 [phone]

## 2020-04-03 NOTE — ED Triage Notes (Addendum)
Patient to ER for c/o chest congestion, nasal congestion, runny nose, chest pain, abd pain, vomiting x3 since day before yesterday. +Cough. Patient is also [redacted] weeks pregnant.

## 2020-04-03 NOTE — ED Notes (Signed)
See triage note  Presents with some n/v/d and "feeling bad"  Last time vomiied was last pm  Last diarrhea was this am  No fever  States she has also been having some intermittent abd pain  States she is [redacted] weeks pregnant   Denies any vaginal bleeding

## 2020-04-03 NOTE — ED Notes (Signed)
Pt states she did not want to go to L&D  States she will called her MD if she gets worse

## 2020-04-03 NOTE — ED Notes (Signed)
Report called up to L&D  Pt has decreased wheezing  Lung sounds improved

## 2020-04-04 ENCOUNTER — Observation Stay
Admission: EM | Admit: 2020-04-04 | Discharge: 2020-04-04 | Disposition: A | Discharge status: Home / Self Care | Payer: Medicaid Other | Attending: Certified Nurse Midwife | Admitting: Certified Nurse Midwife

## 2020-04-04 ENCOUNTER — Emergency Department: Payer: Medicaid Other

## 2020-04-04 ENCOUNTER — Telehealth: Payer: Self-pay | Admitting: *Deleted

## 2020-04-04 ENCOUNTER — Telehealth: Payer: Self-pay

## 2020-04-04 ENCOUNTER — Other Ambulatory Visit: Payer: Self-pay

## 2020-04-04 DIAGNOSIS — J45909 Unspecified asthma, uncomplicated: Secondary | ICD-10-CM | POA: Insufficient documentation

## 2020-04-04 DIAGNOSIS — J4540 Moderate persistent asthma, uncomplicated: Secondary | ICD-10-CM

## 2020-04-04 DIAGNOSIS — Z87891 Personal history of nicotine dependence: Secondary | ICD-10-CM | POA: Insufficient documentation

## 2020-04-04 DIAGNOSIS — Z3A21 21 weeks gestation of pregnancy: Secondary | ICD-10-CM | POA: Insufficient documentation

## 2020-04-04 DIAGNOSIS — O26899 Other specified pregnancy related conditions, unspecified trimester: Secondary | ICD-10-CM | POA: Diagnosis present

## 2020-04-04 DIAGNOSIS — N771 Vaginitis, vulvitis and vulvovaginitis in diseases classified elsewhere: Secondary | ICD-10-CM | POA: Diagnosis not present

## 2020-04-04 DIAGNOSIS — R102 Pelvic and perineal pain: Secondary | ICD-10-CM | POA: Diagnosis not present

## 2020-04-04 DIAGNOSIS — J4521 Mild intermittent asthma with (acute) exacerbation: Secondary | ICD-10-CM | POA: Diagnosis not present

## 2020-04-04 DIAGNOSIS — O99891 Other specified diseases and conditions complicating pregnancy: Secondary | ICD-10-CM | POA: Diagnosis not present

## 2020-04-04 LAB — URINALYSIS, COMPLETE (UACMP) WITH MICROSCOPIC
Bilirubin Urine: NEGATIVE
Glucose, UA: NEGATIVE mg/dL
Hgb urine dipstick: NEGATIVE
Ketones, ur: NEGATIVE mg/dL
Nitrite: NEGATIVE
Protein, ur: NEGATIVE mg/dL
Specific Gravity, Urine: 1.012 (ref 1.005–1.030)
pH: 7 (ref 5.0–8.0)

## 2020-04-04 LAB — WET PREP, GENITAL
Sperm: NONE SEEN
Trich, Wet Prep: NONE SEEN
Yeast Wet Prep HPF POC: NONE SEEN

## 2020-04-04 MED ORDER — ALBUTEROL SULFATE (2.5 MG/3ML) 0.083% IN NEBU
2.5000 mg | INHALATION_SOLUTION | Freq: Once | RESPIRATORY_TRACT | Status: AC
  Administered 2020-04-04: 2.5 mg via RESPIRATORY_TRACT
  Filled 2020-04-04: qty 3

## 2020-04-04 MED ORDER — CYCLOBENZAPRINE HCL 10 MG PO TABS: 10.0000 mg | ORAL_TABLET | Freq: Three times a day (TID) | ORAL | 0 refills | Status: DC | PRN

## 2020-04-04 MED ORDER — METRONIDAZOLE 500 MG PO TABS: 500.0000 mg | ORAL_TABLET | Freq: Two times a day (BID) | ORAL | 0 refills | Status: DC

## 2020-04-04 MED ORDER — CYCLOBENZAPRINE HCL 10 MG PO TABS
10.0000 mg | ORAL_TABLET | Freq: Once | ORAL | Status: AC
  Administered 2020-04-04: 10 mg via ORAL
  Filled 2020-04-04: qty 1

## 2020-04-04 MED ORDER — METRONIDAZOLE 500 MG PO TABS
500.0000 mg | ORAL_TABLET | Freq: Two times a day (BID) | ORAL | Status: DC
  Administered 2020-04-04: 500 mg via ORAL
  Filled 2020-04-04: qty 1

## 2020-04-04 NOTE — OB Triage Note (Addendum)
Pt presents c/o spotting, and vaginal pressure. Pt rates pressure/pain 7 out of 10 on pain scale. Pt also presents with a cough that she has had since Sunday. Pt states she was just given a breathing treatment in the ED. Pt states she had a negative covid test yesterday as well.  Pt states spotting was bright red in color. Pt states last intercourse was this morning. Denies LOF. Reports positive fetal movement. Fetal heart tone obtained 143 bpm. Continuous toco. VSS. Will continue to monitor.

## 2020-04-04 NOTE — Telephone Encounter (Signed)
   Care Management   Outreach Note  04/04/2020 Name: KEYANNA SANDEFER MRN: [id number] DOB: 17-Apr-2001   Primary Care Provider: Alba Cory, MD Reason for referral : Chronic Care Management   An unsuccessful telephone outreach was attempted today. Ms. Tippins was referred to the case management team for assistance with care management and care coordination.    PLAN The care management team will reach out to Ms. Gailey again within the next 7 business days.   [facility] Care Management [facility] [phone]

## 2020-04-04 NOTE — Telephone Encounter (Signed)
Pt in ED on 04/03/20. Pt currently at [facility], In ED. Orders placed for [facility] Coordination due to > 2 ED visits in less than 2 weeks. Transition of care to be completed at that time.  Burnard Bunting, RN, BSN, CCRN [facility] [phone]

## 2020-04-04 NOTE — ED Notes (Signed)
See triage note Presents with wheezing  States she was seen yesterday for the same   Was not able to get her rx's yesterday  Pt arrived with some exp wheezing on arrival  Also states she noticed some vaginal spotting this am   Pt is 21 weeks preg

## 2020-04-04 NOTE — ED Triage Notes (Addendum)
Pt comes POV for wheezing in her chest. Pt [redacted] weeks pregnant. Reports breathing tx helped her feel better yesterday but then it got worse again. Speaking in full sentences. Wants a chest xray (refused it yesterday but wants it now)

## 2020-04-04 NOTE — ED Notes (Signed)
Report called to L&D.

## 2020-04-04 NOTE — ED Provider Notes (Signed)
[facility] Emergency Department Provider Note  ____________________________________________   First MD Initiated Contact with Patient 04/04/20 1334     (approximate)  I have reviewed the triage vital signs and the nursing notes.   HISTORY  Chief Complaint Asthma   HPI Grace [name] is a 19 y.o. female presents to the ED with complaint of wheezing.  Patient states that she has a history of asthma.  Patient was here yesterday and got an albuterol treatment but reportedly refused a chest x-ray.  Patient also declined going to L&D for observation.  She presents today with continued wheezing secondary to not picking up her medication.  She continues to speak in full sentences without any respiratory difficulty.  She is requesting a chest x-ray today.  Covid test yesterday was negative.  Patient also reports some bright red spotting with cramping that started today and now would like to be sent to L&D for observation.      Past Medical History:  Diagnosis Date  . Acne   . Allergy    ALLERGIC RHINITIS  . Anxiety   . Asthma   . Deliberate self-cutting   . Depression   . Fibrocystic disease of breast   . Head pain   . History of depression 12/20/2015  . History of self-harm   . Knee pain, right   . Primary dysmenorrhea   . Severe myopia of both eyes     Patient Active Problem List   Diagnosis Date Noted  . Vaginal discharge during pregnancy 03/28/2020  . BV (bacterial vaginosis) 03/02/2019  . Mood disorder (HCC) 11/15/2018  . PTSD (post-traumatic stress disorder) 11/15/2018  . Class 1 obesity due to excess calories without serious comorbidity with body mass index (BMI) of 30.0 to 30.9 in adult 11/15/2018  . Neutropenia (HCC) 08/06/2018  . Major depression, recurrent, chronic (HCC) 08/06/2018  . GAD (generalized anxiety disorder) 08/06/2018  . Panic attack 01/22/2016  . Chronic allergic rhinitis 12/20/2015  . Asthma, moderate persistent,  poorly-controlled 12/20/2015  . 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 LAPAROSCOPY  05/07/2017   Procedure: LAPAROSCOPY DIAGNOSTIC;  Surgeon: Feliberto Gottron Ihor Austin, MD;  Location: [facility] ORS;  Service: Gynecology;;    Prior to Admission medications   Medication Sig Start Date End Date Taking? Authorizing Provider  albuterol (VENTOLIN HFA) 108 (90 Base) MCG/ACT inhaler Inhale 2 puffs into the lungs every 6 (six) hours as needed for wheezing or shortness of breath. 04/03/20  Yes Lucy Chris, PA  cetirizine (ZYRTEC) 10 MG tablet Take 1 tablet (10 mg total) by mouth daily. 09/02/19  Yes Doren Custard, FNP  Doxylamine-Pyridoxine 10-10 MG TBEC Take 1 tablet by mouth at bedtime. 11/28/19  Yes Danelle Berry, PA-C  famotidine (PEPCID) 40 MG tablet TAKE 1 TABLET(40 MG) BY MOUTH AT BEDTIME 04/24/19  Yes Sowles, Danna Hefty, MD  hydrOXYzine (VISTARIL) 25 MG capsule Take 25 mg by mouth every 8 (eight) hours as needed for anxiety.   Yes [provider]  imiquimod (ALDARA) 5 % cream Apply topically 3 (three) times a week. 11/23/19  Yes Danelle Berry, PA-C  montelukast (SINGULAIR) 10 MG tablet Take 1 tablet (10 mg total) by mouth at bedtime. 09/02/19  Yes Doren Custard, FNP  polyethylene glycol powder (GLYCOLAX/MIRALAX) 17 GM/SCOOP powder Take 17 g by mouth daily. 12/20/18  Yes Sowles, Danna Hefty, MD  predniSONE (DELTASONE) 10 MG tablet Take 4 tablets (40 mg total) by mouth daily for  3 days. 04/03/20 04/06/20 Yes Rodgers, Ruben Gottron, PA  Prenatal Vit-Fe Fumarate-FA (PRENATAL MULTIVITAMIN) TABS tablet Take 1 tablet by mouth daily at 12 noon. 04/05/19  Yes Alba Cory, MD  Spacer/Aero-Holding Chambers (OPTICHAMBER Jiali-LG MASK) [facility] See admin instructions. use with inhaler 08/29/16  Yes [provider]    Allergies Mold extract [trichophyton]  Family History  Problem Relation Age of Onset  . Asthma Father   . Hypertension Father   . ADD / ADHD  Brother     Social History Social History   Tobacco Use  . Smoking status: Former Smoker    Packs/day: 0.25    Years: 2.00    Pack years: 0.50    Start date: 03/01/2017  . Smokeless tobacco: Never Used  . Tobacco comment: using nicotene patches at the moment  Vaping Use  . Vaping Use: Every day  . Start date: 03/06/2017  . Last attempt to quit: 08/06/2017  Substance Use Topics  . Alcohol use: No    Alcohol/week: 0.0 standard drinks  . Drug use: Yes    Frequency: 7.0 times per week    Types: Marijuana    Review of Systems Constitutional: No fever/chills Eyes: No visual changes. ENT: No sore throat. Cardiovascular: Denies chest pain. Respiratory: Denies shortness of breath.  Positive for wheezing. Gastrointestinal: No abdominal pain.  No nausea, no vomiting.   Genitourinary: Negative for dysuria.  Positive for bright red spotting, cramping. Musculoskeletal: Negative for muscle aches. Skin: Negative for rash. Neurological: Negative for headaches, focal weakness or numbness. Psychiatric:  Positive history of PTSD, panic attacks, major depression recurrent, positive for vaping and current use of marijuana.  ____________________________________________   PHYSICAL EXAM:  VITAL SIGNS: ED Triage Vitals  Enc Vitals Group     BP 04/04/20 1156 129/77     Pulse Rate 04/04/20 1156 (!) 103     Resp 04/04/20 1156 18     Temp 04/04/20 1156 98.1 F (36.7 C)     Temp Source 04/04/20 1156 Oral     SpO2 04/04/20 1156 97 %     Weight --      Height --      Head Circumference --      Peak Flow --      Pain Score 04/04/20 1157 0     Pain Loc --      Pain Edu? --      Excl. in GC? --     Constitutional: Alert and oriented. Well appearing and in no acute distress.  Patient is able speak in complete sentences that any difficulty. Eyes: Conjunctivae are normal.  Head: Atraumatic. Nose: No congestion/rhinnorhea. Mouth/Throat: Mucous membranes are moist.  Oropharynx  non-erythematous. Neck: No stridor.   Cardiovascular: Normal rate, regular rhythm. Grossly normal heart sounds.  Good peripheral circulation. Respiratory: Normal respiratory effort.  No retractions. Lungs amatory bilateral wheezing noted throughout. Gastrointestinal: Soft and nontender. No distention.  Musculoskeletal: Moves upper and lower extremities with any difficulty.  No edema noted lower extremities. Neurologic:  Normal speech and language. No gross focal neurologic deficits are appreciated.  Skin:  Skin is warm, dry and intact. No rash noted. Psychiatric: Mood and affect are normal. Speech and behavior are normal.  ____________________________________________   LABS (all labs ordered are listed, but only abnormal results are displayed)  Labs Reviewed  WET PREP, GENITAL  URINALYSIS, COMPLETE (UACMP) WITH MICROSCOPIC    RADIOLOGY  Official radiology report(s): DG Chest 2 View  Result Date: 04/04/2020 CLINICAL DATA:  Wheezing.  EXAM: CHEST - 2 VIEW COMPARISON:  Chest x-ray dated August 06, 2018. FINDINGS: The heart size and mediastinal contours are within normal limits. Both lungs are clear. The visualized skeletal structures are unremarkable. IMPRESSION: No active cardiopulmonary disease. Electronically Signed   By: Obie Dredge M.D.   On: 04/04/2020 13:02    ____________________________________________   PROCEDURES  Procedure(s) performed (including Critical Care):  Procedures   ____________________________________________   INITIAL IMPRESSION / ASSESSMENT AND PLAN / ED COURSE  As part of my medical decision making, I reviewed the following data within the electronic MEDICAL RECORD NUMBER Notes from prior ED visits and  Controlled Substance Database  ANDRINA LOCKEN was evaluated in Emergency Department on 04/04/2020 for the symptoms described in the history of present illness. She was evaluated in the context of the global COVID-19 pandemic, which necessitated  consideration that the patient might be at risk for infection with the SARS-CoV-2 virus that causes COVID-19. Institutional protocols and algorithms that pertain to the evaluation of patients at risk for COVID-19 are in a state of rapid change based on information released by regulatory bodies including the CDC and federal and state organizations. These policies and algorithms were followed during the patient's care in the ED.   19 year old female presents to the ED with complaint of wheezing.  Patient has a history of asthma and was seen yesterday for the same.  She has not picked up her inhaler or steroids that were prescribed yesterday.  Patient also reports some light bright red spotting along with cramping that began today.  She was seen in the emergency department yesterday at which time she refused a chest x-ray but Covid results were negative.  Today's chest x-ray is negative for cardiopulmonary disease.  Patient was given a nebulizer treatment with albuterol and improved.  Patient today is willing to be observed and evaluated for her vaginal spotting.  Patient was stable to go to third floor and was taken by wheelchair.  ____________________________________________   FINAL CLINICAL IMPRESSION(S) / ED DIAGNOSES  Final diagnoses:  Mild intermittent asthma with exacerbation  [redacted] weeks gestation of pregnancy     ED Discharge Orders    None       Note:  This document was prepared using Dragon voice recognition software and may include unintentional dictation errors.    Tommi Rumps, PA-C 04/04/20 1535    Shaune Pollack, MD 04/05/20 2255

## 2020-04-04 NOTE — Discharge Summary (Signed)
Grace [name] is a 19 y.o. female. She is at [redacted]w[redacted]d gestation. Patient's last menstrual period was 11/05/2019 (exact date). Estimated Date of Delivery: 08/11/20  Prenatal care site: [facility]   Chief complaint: vaginal pressure Location: vagina/pelvis Onset/timing: 3-4 days ago Duration: constant Quality: pressure Severity: moderate Aggravating or alleviating conditions: none Associated signs/symptoms: sharp intermittent vaginal pain, spotting Context: Grace [name] reports vaginal pressure for the past 3-4 days. It has been constant and crampy. Since yesterday, she has also had intermittent sharp vaginal pains. She also had some spotting with wiping after arriving to the hospital today. Last intercourse this morning.   S: Resting comfortably.   She reports:  -active fetal movement -no leakage of fluid -no vaginal bleeding -no contractions  Maternal Medical History:   Past Medical History:  Diagnosis Date  . Acne   . Allergy    ALLERGIC RHINITIS  . Anxiety   . Asthma   . Deliberate self-cutting   . Depression   . Fibrocystic disease of breast   . Head pain   . History of depression 12/20/2015  . History of self-harm   . Knee pain, right   . Primary dysmenorrhea   . Severe myopia of both eyes     Past Surgical History:  Procedure Laterality Date  . LAPAROSCOPY  05/07/2017   Procedure: LAPAROSCOPY DIAGNOSTIC;  Surgeon: Feliberto Gottron Ihor Austin, MD;  Location: [facility] ORS;  Service: Gynecology;;    Allergies  Allergen Reactions  . Mold Extract [Trichophyton] Other (See Comments)    Causes chest pain Per allergy test    Prior to Admission medications   Medication Sig Start Date End Date Taking? Authorizing Provider  albuterol (VENTOLIN HFA) 108 (90 Base) MCG/ACT inhaler Inhale 2 puffs into the lungs every 6 (six) hours as needed for wheezing or shortness of breath. 04/03/20  Yes Lucy Chris, PA  cetirizine (ZYRTEC) 10 MG tablet Take 1 tablet (10 mg  total) by mouth daily. 09/02/19  Yes Doren Custard, FNP  Doxylamine-Pyridoxine 10-10 MG TBEC Take 1 tablet by mouth at bedtime. 11/28/19  Yes Danelle Berry, PA-C  famotidine (PEPCID) 40 MG tablet TAKE 1 TABLET(40 MG) BY MOUTH AT BEDTIME 04/24/19  Yes Sowles, Danna Hefty, MD  hydrOXYzine (VISTARIL) 25 MG capsule Take 25 mg by mouth every 8 (eight) hours as needed for anxiety.   Yes [provider]  imiquimod (ALDARA) 5 % cream Apply topically 3 (three) times a week. 11/23/19  Yes Danelle Berry, PA-C  montelukast (SINGULAIR) 10 MG tablet Take 1 tablet (10 mg total) by mouth at bedtime. 09/02/19  Yes Doren Custard, FNP  polyethylene glycol powder (GLYCOLAX/MIRALAX) 17 GM/SCOOP powder Take 17 g by mouth daily. 12/20/18  Yes Sowles, Danna Hefty, MD  predniSONE (DELTASONE) 10 MG tablet Take 4 tablets (40 mg total) by mouth daily for 3 days. 04/03/20 04/06/20 Yes Rodgers, Ruben Gottron, PA  Prenatal Vit-Fe Fumarate-FA (PRENATAL MULTIVITAMIN) TABS tablet Take 1 tablet by mouth daily at 12 noon. 04/05/19  Yes Alba Cory, MD  Spacer/Aero-Holding Chambers (OPTICHAMBER Jaeline-LG MASK) [facility] See admin instructions. use with inhaler 08/29/16  Yes [provider]     Social History: She  reports that she has quit smoking. She started smoking about 3 years ago. She has a 0.50 pack-year smoking history. She has never used smokeless tobacco. She reports current drug use. Frequency: 7.00 times per week. Drug: Marijuana. She reports that she does not drink alcohol.  Family History: family history includes ADD / ADHD  in her brother; Asthma in her father; Hypertension in her father.   Review of Systems: A full review of systems was performed and negative except as noted in the HPI.     O:  BP 105/61 (BP Location: Right Arm)   Pulse 83   Temp 98.1 F (36.7 C) (Oral)   Resp 18   Ht 5\' 6"  (1.676 m)   Wt 84.8 kg   LMP 11/05/2019 (Exact Date)   SpO2 97%   BMI 30.18 kg/m  Results for orders placed or performed  during the hospital encounter of 04/04/20 (from the past 48 hour(s))  Urinalysis, Complete w Microscopic Urine, Clean Catch   Collection Time: 04/04/20  2:55 PM  Result Value Ref Range   Color, Urine YELLOW (A) YELLOW   APPearance CLOUDY (A) CLEAR   Specific Gravity, Urine 1.012 1.005 - 1.030   pH 7.0 5.0 - 8.0   Glucose, UA NEGATIVE NEGATIVE mg/dL   Hgb urine dipstick NEGATIVE NEGATIVE   Bilirubin Urine NEGATIVE NEGATIVE   Ketones, ur NEGATIVE NEGATIVE mg/dL   Protein, ur NEGATIVE NEGATIVE mg/dL   Nitrite NEGATIVE NEGATIVE   Leukocytes,Ua TRACE (A) NEGATIVE   RBC / HPF 0-5 0 - 5 RBC/hpf   WBC, UA 0-5 0 - 5 WBC/hpf   Bacteria, UA RARE (A) NONE SEEN   Squamous Epithelial / LPF 21-50 0 - 5   Mucus PRESENT    Sperm, UA PRESENT   Wet prep, genital   Collection Time: 04/04/20  3:29 PM  Result Value Ref Range   Yeast Wet Prep HPF POC NONE SEEN NONE SEEN   Trich, Wet Prep NONE SEEN NONE SEEN   Clue Cells Wet Prep HPF POC PRESENT (A) NONE SEEN   WBC, Wet Prep HPF POC MODERATE (A) NONE SEEN   Sperm NONE SEEN   Results for orders placed or performed during the hospital encounter of 04/03/20 (from the past 48 hour(s))  Lipase, blood   Collection Time: 04/03/20  7:15 AM  Result Value Ref Range   Lipase 21 11 - 51 U/L  Comprehensive metabolic panel   Collection Time: 04/03/20  7:15 AM  Result Value Ref Range   Sodium 134 (L) 135 - 145 mmol/L   Potassium 3.3 (L) 3.5 - 5.1 mmol/L   Chloride 102 98 - 111 mmol/L   CO2 20 (L) 22 - 32 mmol/L   Glucose, Bld 115 (H) 70 - 99 mg/dL   BUN <5 (L) 6 - 20 mg/dL   Creatinine, Ser 06/03/20 0.44 - 1.00 mg/dL   Calcium 8.8 (L) 8.9 - 10.3 mg/dL   Total Protein 6.9 6.5 - 8.1 g/dL   Albumin 3.5 3.5 - 5.0 g/dL   AST 20 15 - 41 U/L   ALT 14 0 - 44 U/L   Alkaline Phosphatase 66 38 - 126 U/L   Total Bilirubin 0.7 0.3 - 1.2 mg/dL   GFR calc non Af Amer >60 >60 mL/min   GFR calc Af Amer >60 >60 mL/min   Anion gap 12 5 - 15  CBC   Collection Time: 04/03/20   7:15 AM  Result Value Ref Range   WBC 8.6 4.0 - 10.5 K/uL   RBC 3.70 (L) 3.87 - 5.11 MIL/uL   Hemoglobin 11.1 (L) 12.0 - 15.0 g/dL   HCT 06/03/20 (L) 36 - 46 %   MCV 86.5 80.0 - 100.0 fL   MCH 30.0 26.0 - 34.0 pg   MCHC 34.7 30.0 - 36.0 g/dL  RDW 13.4 11.5 - 15.5 %   Platelets 236 150 - 400 K/uL   nRBC 0.0 0.0 - 0.2 %  Urinalysis, Complete w Microscopic Urine   Collection Time: 04/03/20  7:15 AM  Result Value Ref Range   Color, Urine YELLOW (A) YELLOW   APPearance CLEAR (A) CLEAR   Specific Gravity, Urine 1.011 1.005 - 1.030   pH 7.0 5.0 - 8.0   Glucose, UA NEGATIVE NEGATIVE mg/dL   Hgb urine dipstick NEGATIVE NEGATIVE   Bilirubin Urine NEGATIVE NEGATIVE   Ketones, ur NEGATIVE NEGATIVE mg/dL   Protein, ur NEGATIVE NEGATIVE mg/dL   Nitrite NEGATIVE NEGATIVE   Leukocytes,Ua NEGATIVE NEGATIVE   RBC / HPF 0-5 0 - 5 RBC/hpf   WBC, UA 0-5 0 - 5 WBC/hpf   Bacteria, UA NONE SEEN NONE SEEN   Squamous Epithelial / LPF 0-5 0 - 5   Mucus PRESENT    Hyaline Casts, UA PRESENT   SARS Coronavirus 2 by RT PCR (hospital order, performed in [facility] hospital lab) Nasopharyngeal Nasopharyngeal Swab   Collection Time: 04/03/20  9:22 AM   Specimen: Nasopharyngeal Swab  Result Value Ref Range   SARS Coronavirus 2 NEGATIVE NEGATIVE     Constitutional: NAD, AAOx3  HE/ENT: extraocular movements grossly intact, moist mucous membranes CV: RRR PULM: normal respiratory effort, CTABL     Abd: gravid, non-tender, non-distended, soft      Ext: Non-tender, Nonedmeatous   Psych: mood appropriate, speech normal Pelvic: SSE: cervix visually closed and long, no bleeding and no blood in vaginal vault, no pooling of fluid  Monitoring:  FHR: 140bpm by doppler  Toco: quiet Time:   A/P: 20 y.o. [redacted]w[redacted]d here for antenatal surveillance during pregnancy.  Principle diagnosis: Pelvic pain in pregnancy, bacterial vaginosis  Labor  Not present  Fetal Wellbeing  Normal FHT, reassuring for  GA  Bacterial vaginosis  Wet prep with clue cells. Will treat for BV with metronidazole 500mg  BID x 7 days.   Pelvic pain/pressure  Flexeril given, which seemed to help.   Reviewed that pain is most likely musculoskeletal in nature. Advised pregnancy support band, Tylenol and Flexeril PRN as helpful, warm baths/showers, and chiropractic care.   D/c home stable, precautions reviewed, follow-up as scheduled.    04/04/2020 5:04 PM  ----- 06/04/2020, CNM Certified Nurse Midwife [facility], Department of OB/GYN [facility]

## 2020-04-04 NOTE — Discharge Summary (Signed)
RN reviewed discharge instructions with patient. Gave patient opportunity for questions. All questions answered at this time. Pt discharged home.

## 2020-04-11 ENCOUNTER — Ambulatory Visit: Payer: Self-pay

## 2020-04-24 ENCOUNTER — Encounter: Payer: Self-pay | Admitting: Family Medicine

## 2020-05-01 ENCOUNTER — Other Ambulatory Visit: Payer: No Typology Code available for payment source

## 2020-05-01 NOTE — Chronic Care Management (AMB) (Signed)
  Chronic Care Management   Note   Name: Grace [name] MRN: [id number] DOB: [birth year]    An Emergent outreach was requested by the Patient Engagement Center on 04/04/20 due to frequent visits to the Emergency Department.  The initial outreach attempt was unsuccessful.  Ms. Torti returned the call and a brief outreach was conducted today. She reports doing very well since being evaluated in the Emergency Department on 04/04/20. Reports having a history of asthma and states her symptoms were likely due to a sinus infection. She denies recent episodes of shortness of breath and reports taking medications as prescribed. She does report an occasional nonproductive cough but otherwise reports feeling well. She denies recent episodes of cramping, contractions or vaginal bleeding. Reports a pending office visit with her obstetrician later this month.  Ms. Schnitzer denies urgent concerns or further care management needs.  She agreed to call if her condition changes or if she requires assistance from the care management team.   Katha Cabal [facility] Care Management [facility] [phone]

## 2020-05-07 ENCOUNTER — Encounter: Payer: Self-pay | Admitting: Obstetrics and Gynecology

## 2020-05-07 ENCOUNTER — Other Ambulatory Visit: Payer: Self-pay

## 2020-05-07 ENCOUNTER — Observation Stay
Admission: EM | Admit: 2020-05-07 | Discharge: 2020-05-07 | Disposition: A | Discharge status: Home / Self Care | Payer: Medicaid Other | Attending: Obstetrics and Gynecology | Admitting: Obstetrics and Gynecology

## 2020-05-07 DIAGNOSIS — O99891 Other specified diseases and conditions complicating pregnancy: Secondary | ICD-10-CM | POA: Diagnosis present

## 2020-05-07 DIAGNOSIS — R109 Unspecified abdominal pain: Secondary | ICD-10-CM | POA: Diagnosis present

## 2020-05-07 DIAGNOSIS — R1084 Generalized abdominal pain: Secondary | ICD-10-CM | POA: Diagnosis not present

## 2020-05-07 DIAGNOSIS — N898 Other specified noninflammatory disorders of vagina: Secondary | ICD-10-CM | POA: Diagnosis not present

## 2020-05-07 DIAGNOSIS — Z87891 Personal history of nicotine dependence: Secondary | ICD-10-CM | POA: Diagnosis not present

## 2020-05-07 DIAGNOSIS — O26899 Other specified pregnancy related conditions, unspecified trimester: Secondary | ICD-10-CM | POA: Diagnosis present

## 2020-05-07 DIAGNOSIS — Z3A26 26 weeks gestation of pregnancy: Secondary | ICD-10-CM | POA: Diagnosis not present

## 2020-05-07 DIAGNOSIS — Z79899 Other long term (current) drug therapy: Secondary | ICD-10-CM | POA: Diagnosis not present

## 2020-05-07 DIAGNOSIS — J45909 Unspecified asthma, uncomplicated: Secondary | ICD-10-CM | POA: Insufficient documentation

## 2020-05-07 LAB — WET PREP, GENITAL
Clue Cells Wet Prep HPF POC: NONE SEEN
Sperm: NONE SEEN
Trich, Wet Prep: NONE SEEN

## 2020-05-07 LAB — URINALYSIS, COMPLETE (UACMP) WITH MICROSCOPIC
Bilirubin Urine: NEGATIVE
Glucose, UA: NEGATIVE mg/dL
Hgb urine dipstick: NEGATIVE
Ketones, ur: NEGATIVE mg/dL
Nitrite: NEGATIVE
Protein, ur: NEGATIVE mg/dL
Specific Gravity, Urine: 1.01 (ref 1.005–1.030)
pH: 7 (ref 5.0–8.0)

## 2020-05-07 LAB — RUPTURE OF MEMBRANE (ROM)PLUS: Rom Plus: NEGATIVE

## 2020-05-07 MED ORDER — ACETAMINOPHEN 500 MG PO TABS
1000.0000 mg | ORAL_TABLET | Freq: Four times a day (QID) | ORAL | Status: DC | PRN
  Administered 2020-05-07: 1000 mg via ORAL
  Filled 2020-05-07: qty 2

## 2020-05-07 MED ORDER — CYCLOBENZAPRINE HCL 10 MG PO TABS
10.0000 mg | ORAL_TABLET | Freq: Three times a day (TID) | ORAL | Status: DC | PRN
  Administered 2020-05-07: 10 mg via ORAL
  Filled 2020-05-07 (×2): qty 1

## 2020-05-07 MED ORDER — FLUCONAZOLE 50 MG PO TABS
150.0000 mg | ORAL_TABLET | Freq: Once | ORAL | Status: AC
  Administered 2020-05-07: 150 mg via ORAL
  Filled 2020-05-07: qty 1

## 2020-05-07 MED ORDER — CYCLOBENZAPRINE HCL 10 MG PO TABS: 10.0000 mg | ORAL_TABLET | Freq: Three times a day (TID) | ORAL | 0 refills | Status: DC | PRN

## 2020-05-07 NOTE — OB Triage Note (Signed)
Discharge instructions and pt prescription reviewed. Pt verbalized understanding. Labor red flag warnings went over and pt verbalized understanding. Pt stable at time of discharge. All questions answered and no further needs at this time.

## 2020-05-07 NOTE — Discharge Summary (Signed)
Grace [name] is a 19 y.o. female. She is at [redacted]w[redacted]d gestation. Patient's last menstrual period was 11/05/2019 (exact date). Estimated Date of Delivery: 08/11/20  Prenatal care site: [facility]   Chief complaint: abdominal pain Location: general abdomen Onset/timing: yesterday midday Duration: constant Severity: mild to moderate Aggravating or alleviating conditions: none Associated signs/symptoms: leakage of fluid around 2130, spotting with wiping, thick vaginal discharge Context: Shresta reports a general abdominal pain that started yesterday midday, worsening in the early evening. Nothing in particular seems to make it any better or worse. She did not take anything for it as she was at work. She also reports some leakage around 2130, but no pop, and no leakage since then. She also reports some spotting with wiping when using the bathroom. Last intercourse yesterday morning. No urinary symptoms. Last bowel movement yesterday, without concerns for diarrhea or constipation.    S: Resting comfortably.   She reports:  -active fetal movement -no continued leakage of fluid -no contractions  Maternal Medical History:   Past Medical History:  Diagnosis Date  . Acne   . Allergy    ALLERGIC RHINITIS  . Anxiety   . Asthma   . Deliberate self-cutting   . Depression   . Fibrocystic disease of breast   . Head pain   . History of depression 12/20/2015  . History of self-harm   . Knee pain, right   . Primary dysmenorrhea   . Severe myopia of both eyes     Past Surgical History:  Procedure Laterality Date  . LAPAROSCOPY  05/07/2017   Procedure: LAPAROSCOPY DIAGNOSTIC;  Surgeon: Feliberto Gottron Ihor Austin, MD;  Location: [facility] ORS;  Service: Gynecology;;    Allergies  Allergen Reactions  . Mold Extract [Trichophyton] Other (See Comments)    Causes chest pain Per allergy test    Prior to Admission medications   Medication Sig Start Date End Date Taking? Authorizing  Provider  albuterol (VENTOLIN HFA) 108 (90 Base) MCG/ACT inhaler Inhale 2 puffs into the lungs every 6 (six) hours as needed for wheezing or shortness of breath. 04/03/20  Yes Lucy Chris, PA  cetirizine (ZYRTEC) 10 MG tablet Take 1 tablet (10 mg total) by mouth daily. 09/02/19  Yes Doren Custard, FNP  cyclobenzaprine (FLEXERIL) 10 MG tablet Take 1 tablet (10 mg total) by mouth every 8 (eight) hours as needed for muscle spasms. 04/04/20  Yes Genia Del, CNM  montelukast (SINGULAIR) 10 MG tablet Take 1 tablet (10 mg total) by mouth at bedtime. 09/02/19  Yes Doren Custard, FNP  Prenatal Vit-Fe Fumarate-FA (PRENATAL MULTIVITAMIN) TABS tablet Take 1 tablet by mouth daily at 12 noon. 04/05/19  Yes Sowles, Danna Hefty, MD  Doxylamine-Pyridoxine 10-10 MG TBEC Take 1 tablet by mouth at bedtime. Patient not taking: Reported on 05/07/2020 11/28/19   Danelle Berry, PA-C  famotidine (PEPCID) 40 MG tablet TAKE 1 TABLET(40 MG) BY MOUTH AT BEDTIME Patient not taking: Reported on 05/07/2020 04/24/19   Alba Cory, MD  hydrOXYzine (VISTARIL) 25 MG capsule Take 25 mg by mouth every 8 (eight) hours as needed for anxiety. Patient not taking: Reported on 05/07/2020    [provider]  imiquimod (ALDARA) 5 % cream Apply topically 3 (three) times a week. Patient not taking: Reported on 05/07/2020 11/23/19   Danelle Berry, PA-C  metroNIDAZOLE (FLAGYL) 500 MG tablet Take 1 tablet (500 mg total) by mouth every 12 (twelve) hours. Patient not taking: Reported on 05/07/2020 04/05/20   Genia Del, CNM  polyethylene glycol powder (GLYCOLAX/MIRALAX) 17 GM/SCOOP powder Take 17 g by mouth daily. 12/20/18   Alba Cory, MD  Spacer/Aero-Holding Chambers (OPTICHAMBER Shaylyn-LG MASK) [facility] See admin instructions. use with inhaler Patient not taking: Reported on 05/07/2020 08/29/16   [provider]     Social History: She  reports that she has quit smoking. She started smoking about 3 years ago. She has a  0.50 pack-year smoking history. She has never used smokeless tobacco. She reports current drug use. Frequency: 7.00 times per week. Drug: Marijuana. She reports that she does not drink alcohol.  Family History: family history includes ADD / ADHD in her brother; Asthma in her father; Hypertension in her father.   Review of Systems: A full review of systems was performed and negative except as noted in the HPI.     O:  BP (!) 112/57 (BP Location: Left Arm)   Pulse 84   Temp 98.1 F (36.7 C) (Oral)   Resp 16   Ht 5\' 6"  (1.676 m)   Wt 83 kg   LMP 11/05/2019 (Exact Date)   BMI 29.54 kg/m  Results for orders placed or performed during the hospital encounter of 05/07/20 (from the past 48 hour(s))  Wet prep, genital   Collection Time: 05/07/20  1:28 AM  Result Value Ref Range   Yeast Wet Prep HPF POC PRESENT (A) NONE SEEN   Trich, Wet Prep NONE SEEN NONE SEEN   Clue Cells Wet Prep HPF POC NONE SEEN NONE SEEN   WBC, Wet Prep HPF POC MODERATE (A) NONE SEEN   Sperm NONE SEEN   ROM Plus ([facility] only)   Collection Time: 05/07/20  1:28 AM  Result Value Ref Range   Rom Plus NEGATIVE   Urinalysis, Complete w Microscopic   Collection Time: 05/07/20  1:28 AM  Result Value Ref Range   Color, Urine YELLOW (A) YELLOW   APPearance CLEAR (A) CLEAR   Specific Gravity, Urine 1.010 1.005 - 1.030   pH 7.0 5.0 - 8.0   Glucose, UA NEGATIVE NEGATIVE mg/dL   Hgb urine dipstick NEGATIVE NEGATIVE   Bilirubin Urine NEGATIVE NEGATIVE   Ketones, ur NEGATIVE NEGATIVE mg/dL   Protein, ur NEGATIVE NEGATIVE mg/dL   Nitrite NEGATIVE NEGATIVE   Leukocytes,Ua MODERATE (A) NEGATIVE   RBC / HPF 0-5 0 - 5 RBC/hpf   WBC, UA 0-5 0 - 5 WBC/hpf   Bacteria, UA RARE (A) NONE SEEN   Squamous Epithelial / LPF 0-5 0 - 5   Mucus PRESENT      Constitutional: NAD, AAOx3  HE/ENT: extraocular movements grossly intact, moist mucous membranes CV: RRR PULM: normal respiratory effort, CTABL  Back: symmetric, no CVAT    Abd:  gravid, non-tender, non-distended, soft      Ext: Non-tender, Nonedmeatous   Psych: mood appropriate, speech normal Pelvic: SSE: small amount of physiologic discharge present, no pooling of fluid, cervix visually closed  NST:  Baseline: 130bpm Variability: moderate Accelerations: 10x10 present x >2 Decelerations: absent Time: 05/09/20 Toco: quiet   A/P: 19 y.o. [redacted]w[redacted]d here for antenatal surveillance during pregnancy.  Principle diagnosis: muscular discomforts of pregnancy  Labor  Not present  Fetal Wellbeing  Reactive NST, reassuring for GA  Concern for leakage of fluid  Wet prep with yeast  Will treat with fluconazole 150mg  PO x 1 dose  Abdominal pain  UA with moderate leukocytes and rare bacteria. Urine culture added. Will wait for result prior to treatment as patient has no urinary concerns or  symptoms.   Pain improved with Tylenol and Flexeril  D/c home stable, precautions reviewed, follow-up as scheduled.    Genia Del 05/07/2020 2:31 AM  ----- Genia Del, CNM Certified Nurse Midwife [facility], Department of OB/GYN [facility]

## 2020-05-07 NOTE — OB Triage Note (Addendum)
Pt is a 19yo G1P0. Pt presented with abdominal pain, LOF and pressure. Pt stated that the pain stated around 12p 05/06/20, then progressed at 5p and became more painful around 9p. Pt stated around 9:30p she felt some fluid leakage but does not report a pop, more of a gush of fluid. Pt reports some spotting about 10:30p, before coming to the hospital. Pt reports some nauseaness but no vomiting or diarrhea. Pt reports having sex intercourse this morning. Pt reports some vaginal swelling, and discharge thick, clear in color.

## 2020-05-08 LAB — URINE CULTURE

## 2020-05-09 ENCOUNTER — Observation Stay
Admission: EM | Admit: 2020-05-09 | Discharge: 2020-05-09 | Disposition: A | Discharge status: Home / Self Care | Payer: Medicaid Other | Attending: Obstetrics and Gynecology | Admitting: Obstetrics and Gynecology

## 2020-05-09 ENCOUNTER — Observation Stay: Payer: Medicaid Other

## 2020-05-09 DIAGNOSIS — R1030 Lower abdominal pain, unspecified: Secondary | ICD-10-CM | POA: Insufficient documentation

## 2020-05-09 DIAGNOSIS — Z3A26 26 weeks gestation of pregnancy: Secondary | ICD-10-CM | POA: Insufficient documentation

## 2020-05-09 DIAGNOSIS — R102 Pelvic and perineal pain: Secondary | ICD-10-CM | POA: Insufficient documentation

## 2020-05-09 DIAGNOSIS — Z87891 Personal history of nicotine dependence: Secondary | ICD-10-CM | POA: Diagnosis not present

## 2020-05-09 DIAGNOSIS — O99891 Other specified diseases and conditions complicating pregnancy: Secondary | ICD-10-CM | POA: Diagnosis not present

## 2020-05-09 DIAGNOSIS — Z79899 Other long term (current) drug therapy: Secondary | ICD-10-CM | POA: Diagnosis not present

## 2020-05-09 DIAGNOSIS — J45909 Unspecified asthma, uncomplicated: Secondary | ICD-10-CM | POA: Insufficient documentation

## 2020-05-09 DIAGNOSIS — O26899 Other specified pregnancy related conditions, unspecified trimester: Secondary | ICD-10-CM | POA: Diagnosis present

## 2020-05-09 LAB — URINALYSIS, COMPLETE (UACMP) WITH MICROSCOPIC
Bacteria, UA: NONE SEEN
Bilirubin Urine: NEGATIVE
Glucose, UA: NEGATIVE mg/dL
Hgb urine dipstick: NEGATIVE
Ketones, ur: NEGATIVE mg/dL
Leukocytes,Ua: NEGATIVE
Nitrite: NEGATIVE
Protein, ur: NEGATIVE mg/dL
Specific Gravity, Urine: 1.005 (ref 1.005–1.030)
pH: 7 (ref 5.0–8.0)

## 2020-05-09 LAB — CBC
HCT: 33.2 % — ABNORMAL LOW (ref 36.0–46.0)
Hemoglobin: 11.6 g/dL — ABNORMAL LOW (ref 12.0–15.0)
MCH: 30 pg (ref 26.0–34.0)
MCHC: 34.9 g/dL (ref 30.0–36.0)
MCV: 85.8 fL (ref 80.0–100.0)
Platelets: 233 10*3/uL (ref 150–400)
RBC: 3.87 MIL/uL (ref 3.87–5.11)
RDW: 13.7 % (ref 11.5–15.5)
WBC: 5.8 10*3/uL (ref 4.0–10.5)
nRBC: 0 % (ref 0.0–0.2)

## 2020-05-09 LAB — COMPREHENSIVE METABOLIC PANEL
ALT: 9 U/L (ref 0–44)
AST: 13 U/L — ABNORMAL LOW (ref 15–41)
Albumin: 3.2 g/dL — ABNORMAL LOW (ref 3.5–5.0)
Alkaline Phosphatase: 78 U/L (ref 38–126)
Anion gap: 7 (ref 5–15)
BUN: <5 mg/dL — ABNORMAL LOW (ref 6–20)
CO2: 25 mmol/L (ref 22–32)
Calcium: 8.7 mg/dL — ABNORMAL LOW (ref 8.9–10.3)
Chloride: 103 mmol/L (ref 98–111)
Creatinine, Ser: 0.49 mg/dL (ref 0.44–1.00)
GFR calc Af Amer: >60 mL/min (ref >60)
GFR calc non Af Amer: >60 mL/min (ref >60)
Glucose, Bld: 68 mg/dL — ABNORMAL LOW (ref 70–99)
Potassium: 3.8 mmol/L (ref 3.5–5.1)
Sodium: 135 mmol/L (ref 135–145)
Total Bilirubin: 0.5 mg/dL (ref 0.3–1.2)
Total Protein: 6.5 g/dL (ref 6.5–8.1)

## 2020-05-09 MED ORDER — ONDANSETRON HCL 4 MG/2ML IJ SOLN
4.0000 mg | Freq: Three times a day (TID) | INTRAMUSCULAR | Status: DC | PRN
  Administered 2020-05-09: 4 mg via INTRAVENOUS
  Filled 2020-05-09: qty 2

## 2020-05-09 MED ORDER — ACETAMINOPHEN 500 MG PO TABS
1000.0000 mg | ORAL_TABLET | Freq: Four times a day (QID) | ORAL | Status: DC | PRN
  Administered 2020-05-09: 1000 mg via ORAL
  Filled 2020-05-09: qty 2

## 2020-05-09 MED ORDER — LACTATED RINGERS IV BOLUS
1000.0000 mL | Freq: Once | INTRAVENOUS | Status: AC
  Administered 2020-05-09: 1000 mL via INTRAVENOUS

## 2020-05-09 MED ORDER — SIMETHICONE 80 MG PO CHEW
240.0000 mg | CHEWABLE_TABLET | Freq: Two times a day (BID) | ORAL | Status: DC
  Administered 2020-05-09: 240 mg via ORAL
  Filled 2020-05-09: qty 3

## 2020-05-09 NOTE — Evaluation (Signed)
Physical Therapy Evaluation Patient Details Name: Grace [name] MRN: [id number] DOB: April 26, 2001 Today's Date: 05/09/2020   History of Present Illness  Pt is a 19 y.o. female presenting to hospital 9/15; PT consult for abdominal/side pain.  Pt 26 weeks and 4 days pregnant.  PMH includes PTSD, neutropenia, panic attack.  Clinical Impression  Prior to hospital admission, pt was independent and working; lives with her boyfriend and his family.  Pt reports lower abdominal pain and L low back pain started Monday morning; pain is sharp/shooting and lower abdominal pain shoots down inner thigh down front of LE's to her feet when standing (and feels heaviness in pelvis area).  Pt reports belly band use helped but had severe pain when she took it off.  L LE appearing longer than R LE semi-supine in bed.  Heaviness in pelvis and pain shooting down LE's resolved with manual pelvic compression and returned once therapist stopped pressing in on pt's pelvis.  Trialed SI belt use and pt reporting still having pain in lower abdomen in standing but she did not have shooting pain down LE's or heaviness in pelvis and pain felt more manageable with standing and walking.  SI belt removed prior to sitting and after pt layed down in bed pt reporting pain in lower abdomen and L low back: pt donned SI belt and then reporting still having lower abdomen pain but L low back pain resolved and pt reporting being more comfortable.  Pain 8/10 beginning of session and improved to 5/10 end of session: pt reporting use of SI belt made pain better and much more manageable.  Pt issued SI belt (S/M size) for home use for pain relief.  Of note, when therapist entered pt's room pt reporting about an hour prior her fingertips in B hands went "numb"--pt reports her nurse came to assess her.  Pt reports numbness in L fingertips resolved but still had numbness in R fingertips (no change in these symptoms from beginning to end of session): nurse  notified of this and nurse reported she would notify pt's attending.   Pt would benefit from skilled PT to address noted impairments and functional limitations (see below for any additional details).  Upon hospital discharge, pt would benefit from OP PT (pelvic health).    Follow Up Recommendations Outpatient PT (pelvic health)    Equipment Recommendations  Other (comment) (SI belt)    Recommendations for Other Services       Precautions / Restrictions Precautions Precautions: None Precaution Comments: Pregnancy Restrictions Weight Bearing Restrictions: No      Mobility  Bed Mobility Overal bed mobility: Modified Independent             General bed mobility comments: Semi-supine to/from sitting with mild increased effort to perform on own (pt used logrolling towards R side of bed to sit up with cueing)  Transfers Overall transfer level: Modified independent Equipment used: None             General transfer comment: mild increased effort to stand (x2 trials from bed)  Ambulation/Gait Ambulation/Gait assistance: Independent Gait Distance (Feet):  (10 feet; 20 feet in room) Assistive device: None   Gait velocity: decreased   General Gait Details: mild increased BOS with mild increased lateral sway (decreased though with use of SI belt creating more fluid walking pattern); steady  Stairs Stairs:  (discussed sidestepping up/down stairs with use of railing for support (and SI belt use) and SBA for safety)  Wheelchair Mobility    Modified Rankin (Stroke Patients Only)       Balance Overall balance assessment: Needs assistance Sitting-balance support: No upper extremity supported;Feet supported Sitting balance-Leahy Scale: Normal Sitting balance - Comments: steady sitting reaching outside BOS   Standing balance support: No upper extremity supported Standing balance-Leahy Scale: Good Standing balance comment: pt with mild sway in standing with  feet closer together but steady with feet wider apart                             Pertinent Vitals/Pain Pain Assessment: 0-10 Pain Score: 5  Pain Location: lower abdomen Pain Descriptors / Indicators: Discomfort Pain Intervention(s): Limited activity within patient's tolerance;Monitored during session;Repositioned;Other (comment) (nurse notified)    Home Living Family/patient expects to be discharged to:: Private residence Living Arrangements: Other relatives (pt's boyfriend and boyfriends family) Available Help at Discharge: Family Type of Home: House Home Access: Stairs to enter Entrance Stairs-Rails: Right;Left;Can reach both Secretary/administrator of Steps: 4 Home Layout: One level Home Equipment: Grab bars - tub/shower      Prior Function Level of Independence: Independent         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     Hand Dominance        Extremity/Trunk Assessment   Upper Extremity Assessment Upper Extremity Assessment: RUE deficits/detail RUE Deficits / Details: decreased light touch fingertips 1st-4th fingers RUE Sensation: decreased light touch    Lower Extremity Assessment Lower Extremity Assessment: Overall WFL for tasks assessed    Cervical / Trunk Assessment Cervical / Trunk Assessment: Normal  Communication   Communication: No difficulties  Cognition Arousal/Alertness: Awake/alert Behavior During Therapy: WFL for tasks assessed/performed Overall Cognitive Status: Within Functional Limits for tasks assessed                                        General Comments   Nursing cleared pt for participation in physical therapy.  Pt agreeable to PT session.    Exercises  Educated pt on proper donning/doffing/positioning of SI belt and appropriate use of SI belt.  Educated pt to use SI belt once standing but to loosen SI belt prior to sitting.  Also educated pt on use of SI belt in bed when awake (d/t improving pain  when laying down) but not to use SI belt when sleeping.  Pt verbalizing appropriate understanding and able to appropriately donn/doff SI belt during session.   Assessment/Plan    PT Assessment Patient needs continued PT services  PT Problem List Decreased balance;Decreased mobility;Decreased activity tolerance;Decreased knowledge of use of DME;Pain;Impaired sensation       PT Treatment Interventions DME instruction;Gait training;Stair training;Functional mobility training;Therapeutic activities;Therapeutic exercise;Balance training;Patient/family education    PT Goals (Current goals can be found in the Care Plan section)  Acute Rehab PT Goals Patient Stated Goal: to improve pain PT Goal Formulation: With patient Time For Goal Achievement: 05/23/20 Potential to Achieve Goals: Good    Frequency Min 2X/week   Barriers to discharge        Co-evaluation               AM-PAC PT "6 Clicks" Mobility  Outcome Measure Help needed turning from your back to your side while in a flat bed without using bedrails?: None Help needed moving from lying on your back to sitting  on the side of a flat bed without using bedrails?: None Help needed moving to and from a bed to a chair (including a wheelchair)?: None Help needed standing up from a chair using your arms (e.g., wheelchair or bedside chair)?: None Help needed to walk in hospital room?: None Help needed climbing 3-5 steps with a railing? : A Little 6 Click Score: 23    End of Session Equipment Utilized During Treatment:  (SI belt use) Activity Tolerance: Patient tolerated treatment well;No increased pain Patient left: in bed;with call bell/phone within reach Nurse Communication: Mobility status;Precautions;Other (comment) (PT recommendations; pt's c/o R fingertip numbness) PT Visit Diagnosis: Other abnormalities of gait and mobility (R26.89);History of falling (Z91.81);Difficulty in walking, not elsewhere classified (R26.2);Pain     Time: 7353-2992 PT Time Calculation (min) (ACUTE ONLY): 45 min   Charges:   PT Evaluation $PT Eval Low Complexity: 1 Low PT Treatments $Therapeutic Activity: 8-22 mins       Hendricks Limes, PT 05/09/20, 5:21 PM

## 2020-05-09 NOTE — Discharge Instructions (Signed)
Please follow up with Physical therapy for continued pain  [facility] at [address] [employer] floor) [place], [place] [address]   Referral from [facility] PT to see Reina Fuse for pelvic floor therapy  Wear SI belt as needed for continued pain

## 2020-05-09 NOTE — Progress Notes (Addendum)
Grace [name] is a 19 y.o. female. She is at [redacted]w[redacted]d gestation. Patient's last menstrual period was 11/05/2019 (exact date). Estimated Date of Delivery: 08/11/20  Prenatal care site: [facility]   Chief complaint: abdominal pain Location: b/l abdominal pain Onset/timing: a few days ago Duration: constant Quality: shooting, cramping Severity: mild to severe Aggravating or alleviating conditions: moving makes it worse Associated signs/symptoms: radiating left back pain, vaginal pain, nausea and vomiting Context: Grace [name] reports a general lower abdominal abdominal pain that started a few days ago. In the past day or so, it has become constant across her lower abdomen. She describes this as a cramping pain. She also reports intermittent shooting pain that goes down both legs and up her back. Moving and walking makes it worse, but nothing makes it better. Flexeril helps her to sleep, but the pain is present as soon as she wakes up. She reports no dysuria, urinary frequency, or urinary urgency. She reports multiple soft stools yesterday, with regular BMs previously.   S: Resting comfortably.   She reports:  -active fetal movement -no leakage of fluid -no vaginal bleeding  Maternal Medical History:   Past Medical History:  Diagnosis Date  . Acne   . Allergy    ALLERGIC RHINITIS  . Anxiety   . Asthma   . Deliberate self-cutting   . Depression   . Fibrocystic disease of breast   . Head pain   . History of depression 12/20/2015  . History of self-harm   . Knee pain, right   . Primary dysmenorrhea   . Severe myopia of both eyes     Past Surgical History:  Procedure Laterality Date  . LAPAROSCOPY  05/07/2017   Procedure: LAPAROSCOPY DIAGNOSTIC;  Surgeon: Feliberto Gottron Ihor Austin, MD;  Location: [facility] ORS;  Service: Gynecology;;    Allergies  Allergen Reactions  . Mold Extract [Trichophyton] Other (See Comments)    Causes chest pain Per allergy test    Prior to  Admission medications   Medication Sig Start Date End Date Taking? Authorizing Provider  albuterol (VENTOLIN HFA) 108 (90 Base) MCG/ACT inhaler Inhale 2 puffs into the lungs every 6 (six) hours as needed for wheezing or shortness of breath. 04/03/20  Yes Lucy Chris, PA  ARIPiprazole (ABILIFY) 5 MG tablet Take 5 mg by mouth daily.   Yes [provider]  cetirizine (ZYRTEC) 10 MG tablet Take 1 tablet (10 mg total) by mouth daily. 09/02/19  Yes Doren Custard, FNP  cyclobenzaprine (FLEXERIL) 10 MG tablet Take 1 tablet (10 mg total) by mouth every 8 (eight) hours as needed for muscle spasms. 05/07/20  Yes Genia Del, CNM  Prenatal Vit-Fe Fumarate-FA (PRENATAL MULTIVITAMIN) TABS tablet Take 1 tablet by mouth daily at 12 noon. 04/05/19  Yes Sowles, Danna Hefty, MD  sertraline (ZOLOFT) 100 MG tablet Take 100 mg by mouth daily.   Yes [provider]  Doxylamine-Pyridoxine 10-10 MG TBEC Take 1 tablet by mouth at bedtime. Patient not taking: Reported on 05/07/2020 11/28/19   Danelle Berry, PA-C  famotidine (PEPCID) 40 MG tablet TAKE 1 TABLET(40 MG) BY MOUTH AT BEDTIME Patient not taking: Reported on 05/07/2020 04/24/19   Alba Cory, MD  hydrOXYzine (VISTARIL) 25 MG capsule Take 25 mg by mouth every 8 (eight) hours as needed for anxiety. Patient not taking: Reported on 05/07/2020    [provider]  montelukast (SINGULAIR) 10 MG tablet Take 1 tablet (10 mg total) by mouth at bedtime. 09/02/19   Maurice Small  E, FNP  polyethylene glycol powder (GLYCOLAX/MIRALAX) 17 GM/SCOOP powder Take 17 g by mouth daily. Patient not taking: Reported on 05/09/2020 12/20/18   Alba Cory, MD  Spacer/Aero-Holding Chambers (OPTICHAMBER Curtis-LG MASK) [facility] See admin instructions. use with inhaler Patient not taking: Reported on 05/07/2020 08/29/16   [provider]     Social History: She  reports that she has quit smoking. She started smoking about 3 years ago. She has a 0.50 pack-year  smoking history. She has never used smokeless tobacco. She reports current drug use. Frequency: 7.00 times per week. Drug: Marijuana. She reports that she does not drink alcohol.  Family History: family history includes ADD / ADHD in her brother; Asthma in her father; Hypertension in her father.   Review of Systems: A full review of systems was performed and negative except as noted in the HPI.     O:  BP 124/69   Pulse 100   Temp 98.5 F (36.9 C) (Oral)   Resp 18   LMP 11/05/2019 (Exact Date)  Results for orders placed or performed during the hospital encounter of 05/09/20 (from the past 48 hour(s))  CBC on admission   Collection Time: 05/09/20 12:15 PM  Result Value Ref Range   WBC 5.8 4.0 - 10.5 K/uL   RBC 3.87 3.87 - 5.11 MIL/uL   Hemoglobin 11.6 (L) 12.0 - 15.0 g/dL   HCT 76.2 (L) 36 - 46 %   MCV 85.8 80.0 - 100.0 fL   MCH 30.0 26.0 - 34.0 pg   MCHC 34.9 30.0 - 36.0 g/dL   RDW 26.3 33.5 - 45.6 %   Platelets 233 150 - 400 K/uL   nRBC 0.0 0.0 - 0.2 %  Comprehensive metabolic panel   Collection Time: 05/09/20 12:15 PM  Result Value Ref Range   Sodium 135 135 - 145 mmol/L   Potassium 3.8 3.5 - 5.1 mmol/L   Chloride 103 98 - 111 mmol/L   CO2 25 22 - 32 mmol/L   Glucose, Bld 68 (L) 70 - 99 mg/dL   BUN <5 (L) 6 - 20 mg/dL   Creatinine, Ser 2.56 0.44 - 1.00 mg/dL   Calcium 8.7 (L) 8.9 - 10.3 mg/dL   Total Protein 6.5 6.5 - 8.1 g/dL   Albumin 3.2 (L) 3.5 - 5.0 g/dL   AST 13 (L) 15 - 41 U/L   ALT 9 0 - 44 U/L   Alkaline Phosphatase 78 38 - 126 U/L   Total Bilirubin 0.5 0.3 - 1.2 mg/dL   GFR calc non Af Amer >60 >60 mL/min   GFR calc Af Amer >60 >60 mL/min   Anion gap 7 5 - 15  Urinalysis, Complete w Microscopic Urine, Clean Catch   Collection Time: 05/09/20 12:36 PM  Result Value Ref Range   Color, Urine YELLOW (A) YELLOW   APPearance CLEAR (A) CLEAR   Specific Gravity, Urine 1.005 1.005 - 1.030   pH 7.0 5.0 - 8.0   Glucose, UA NEGATIVE NEGATIVE mg/dL   Hgb urine  dipstick NEGATIVE NEGATIVE   Bilirubin Urine NEGATIVE NEGATIVE   Ketones, ur NEGATIVE NEGATIVE mg/dL   Protein, ur NEGATIVE NEGATIVE mg/dL   Nitrite NEGATIVE NEGATIVE   Leukocytes,Ua NEGATIVE NEGATIVE   RBC / HPF 0-5 0 - 5 RBC/hpf   WBC, UA 0-5 0 - 5 WBC/hpf   Bacteria, UA NONE SEEN NONE SEEN   Squamous Epithelial / LPF 0-5 0 - 5     Constitutional: NAD, AAOx3  HE/ENT: extraocular  movements grossly intact, moist mucous membranes CV: RRR PULM: normal respiratory effort, CTABL Back: mild lower back tenderness to palpation     Abd: gravid, generalized mild tenderness to palpation, non-distended, soft     Ext: Non-tender, Nonedmeatous   Psych: mood appropriate, speech normal Pelvic deferred  NST:  Baseline: 140bpm Variability: moderate Accelerations: 10x10 present x >2 Decelerations: absent Time: 40 mins Toco: quiet   A/P: 19 y.o. [redacted]w[redacted]d here for antenatal surveillance during pregnancy.  Principle diagnosis: abdominal pain during pregnancy  Labor  Not present  Fetal Wellbeing  Reactive NST, reassuring for GA  Abdominal pain  CBC and CMP reassuring  UA without evidence of UTI  Renal ultrasound without evidence of kidney stones  PT evaluation ordered  Dr. Dalbert Garnet updated and plans to evaluate the patient  D/c home stable, precautions reviewed, follow-up as scheduled.    Genia Del 05/09/2020 3:47 PM  ----- Genia Del, CNM Certified Nurse Midwife [facility], Department of OB/GYN [facility]

## 2020-05-09 NOTE — OB Triage Note (Signed)
Sent over by provider for evaluation on complaints of lower abdominal pain, left side pain that goes to back, and shooting pains in vaginal. States started throwing up yesterday.  Has vomited once today. Denies leaking of fluid or bleeding.  EFMs applied.

## 2020-05-18 ENCOUNTER — Other Ambulatory Visit: Payer: Self-pay

## 2020-05-18 ENCOUNTER — Ambulatory Visit: Payer: Medicaid Other | Attending: Certified Nurse Midwife | Admitting: Physical Therapy

## 2020-05-18 ENCOUNTER — Encounter: Payer: Self-pay | Admitting: Physical Therapy

## 2020-05-18 DIAGNOSIS — M533 Sacrococcygeal disorders, not elsewhere classified: Secondary | ICD-10-CM | POA: Insufficient documentation

## 2020-05-18 DIAGNOSIS — M6281 Muscle weakness (generalized): Secondary | ICD-10-CM | POA: Insufficient documentation

## 2020-05-18 DIAGNOSIS — R2689 Other abnormalities of gait and mobility: Secondary | ICD-10-CM | POA: Diagnosis present

## 2020-05-18 NOTE — Therapy (Signed)
[facility] MAIN [facility] SERVICES [address] [place], [place], [address] Phone: [phone]   Fax:  [phone]  Physical Therapy Evaluation  Patient Details  Name: Grace [name] MRN: [id number] Date of Birth: 2000-12-14 Referring Provider (PT): [place]    Encounter Date: 05/18/2020   PT End of Session - 05/18/20 1231    Visit Number 1    Number of Visits 10    PT Start Time 0910    PT Stop Time 1000    PT Time Calculation (min) 50 min           Past Medical History:  Diagnosis Date  . Acne   . Allergy    ALLERGIC RHINITIS  . Anxiety   . Asthma   . Deliberate self-cutting   . Depression   . Fibrocystic disease of breast   . Head pain   . History of depression 12/20/2015  . History of self-harm   . Knee pain, right   . Primary dysmenorrhea   . Severe myopia of both eyes     Past Surgical History:  Procedure Laterality Date  . LAPAROSCOPY  05/07/2017   Procedure: LAPAROSCOPY DIAGNOSTIC;  Surgeon: Schermerhorn, Ihor Austin, MD;  Location: [facility] ORS;  Service: Gynecology;;    There were no vitals filed for this visit.    Subjective Assessment - 05/18/20 0916    Subjective 1) Pelvic pain:   Pt is [redacted] weeks pregnant with her first child. Pt reported sharp pain along B sides of low back and down both legs that started one week ago but for the past 2 days, it has been only on the R. The pain occurs with walking, laying down, turning, sitting down on toilet, in car. This pain came on suddenly. Pt had a fall 3 months ago and fell forward and ER had cleared her.  Pt felt no pain with the fall. Pt denied recentl ankle sprains. stepping down a curb. Pain level 7/10.  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 Pt has to lift patients minmial assistance, stand for long periods, bending at her work.    2) constipation: bowel movements occur every other day. Daily fluid intake: 2 cups of water, no coffee,  sodas, 28 oz of juice              [facility] PT Assessment - 05/18/20 0914      Assessment   Medical Diagnosis pelvic pain during pregnancy     Referring Provider (PT) [place]       Precautions   Precautions Other (comment)   pregnancy     Restrictions   Weight Bearing Restrictions No      Balance Screen   Has the patient fallen in the past 6 months Yes   fell forward during pregnancy, 3 months ago, ER cleared her   How many times? 1      Home Environment   Living Environment Private residence    Entrance Stairs-Number of Steps 4      Prior Function   Level of Independence Independent      Observation/Other Assessments   Scoliosis L upper lumbar curve, L iliac crest higher, R lower       AROM   Overall AROM Comments R sideflexion and R otation will discomfortable, ROM intact  ( no pain with rotation/ sideflexion post Tx)       Strength   Overall Strength Comments BLE 5/5 ,  B hip abd 3/5        Palpation   Spinal mobility L convex curve upper lumbar , lumbar segments L hypomobile, lats/ QL L  tightness,       Bed Mobility   Bed Mobility --   poor technique, pain with rolling      Ambulation/Gait   Gait velocity 0.9 m/s ( preTx)  1.06 m/s ( psot Tx)     Gait Comments decreased stance phase R                        Objective measurements completed on examination: See above findings.       [facility] Adult PT Treatment/Exercise - 05/18/20 0914      Neuro Re-ed    Neuro Re-ed Details  cued for body mechanics and clam HEP       Modalities   Modalities Moist Heat      Moist Heat Therapy   Number Minutes Moist Heat 10 Minutes    Moist Heat Location --   L low back, during clam HEP     Manual Therapy   Manual therapy comments STM/MWM , rotational mob L to address  convex curve upper lumbar , lumbar segments L hypomobile, lats/ QL L                        PT Long Term Goals - 05/18/20 0953      PT LONG TERM GOAL #1   Title Pt will  demo equal iliac crest height and no L convex curve across 2 weeks in order to progress to deep core mm exercises    Baseline L iliac crest higher, L convex curve at lumbar spine    Time 2    Period Weeks    Status New    Target Date 06/15/20      PT LONG TERM GOAL #2   Title Pt will demo increased hip abd strength from 3/5 B to > 4/5 in order to improve pelvic girdle stability to perform ADLs during pregnancy    Baseline hip abd strength 3/5    Time 5    Period Weeks    Status New    Target Date 06/22/20      PT LONG TERM GOAL #3   Title Pt will demo IND with proper bedrolling, sit to stand , stand to sit , bending and patient handling techniques to minimzie pain and perform work and ADLs safely during pregnancy    Baseline pt performed poor body mechanics that strain her pelvic floor , spinal, abdomen    Time 8    Period Weeks    Status New    Target Date 07/13/20      PT LONG TERM GOAL #4   Title Pt will improve her FOTO score from 36 pts to > 50 pts in order to perform ADLs    Baseline 36 pts    Time 10    Period Weeks    Status New    Target Date 07/27/20                  Plan - 05/18/20 1231    Clinical Impression Statement  Pt is a  19  yo who is in her 7th week of her first pregnancy who presents with pelvic pain with radiating pain down R leg and also experiences chronic constipation. Pt's musculoskeletal assessment revealed  Misalignment of her pelvic  girdle, L lumbar convex curve, and hip weakness, and poor body mechanics which places strain on the abdominal/pelvic floor mm.    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  Pt will benefit from coordination and body mechanics training and also an order from provider for work restrictions in order to minimize injuries during pregnancy.     Pt was provided education on etiology of Sx with anatomy, physiology explanation with images  along with the benefits of customized pelvic PT Tx based on pt's medical conditions and musculoskeletal deficits.  Explained the physiology of deep core mm coordination and roles of pelvic floor function in urination, defecation, sexual function, and postural control with deep core mm system.   Following Tx today which pt tolerated without complaints, pt demo'd equal alignment of pelvic girdle, ncreased spinal mobility, and increased gait speed. Pt reported decreased pain.  Pt demo'd IND with correct body mechanics with log rolling/ sit to stand and these techniques helped to minimize pain.      Examination-Activity Limitations Squat;Transfers;Lift;Stand    Stability/Clinical Decision Making Evolving/Moderate complexity    Clinical Decision Making Moderate    Rehab Potential Good    PT Frequency 1x / week    PT Duration Other (comment)   10   PT Treatment/Interventions Gait training;Moist Heat;Functional mobility training;Therapeutic activities;Therapeutic exercise;Neuromuscular re-education;Manual techniques;Patient/family education;Scar mobilization;Balance training;Taping;Cryotherapy    Consulted and Agree with Plan of Care Patient           Patient will benefit from skilled therapeutic intervention in order to improve the following deficits and impairments:  Decreased endurance, Difficulty walking, Decreased strength, Decreased coordination, Abnormal gait, Improper body mechanics, Pain, Decreased mobility, Decreased range of motion, Hypomobility, Decreased safety awareness, Postural dysfunction  Visit Diagnosis: Sacrococcygeal disorders, not elsewhere classified  Muscle weakness (generalized)  Other abnormalities of gait and mobility     Problem List Patient Active Problem List   Diagnosis Date Noted  . Abdominal pain affecting pregnancy 05/07/2020  . Pelvic pressure in pregnancy 04/04/2020  . Vaginal discharge during pregnancy 03/28/2020  . BV (bacterial vaginosis) 03/02/2019   . Mood disorder (HCC) 11/15/2018  . PTSD (post-traumatic stress disorder) 11/15/2018  . Class 1 obesity due to excess calories without serious comorbidity with body mass index (BMI) of 30.0 to 30.9 in adult 11/15/2018  . Neutropenia (HCC) 08/06/2018  . Major depression, recurrent, chronic (HCC) 08/06/2018  . GAD (generalized anxiety disorder) 08/06/2018  . Panic attack 01/22/2016  . Chronic allergic rhinitis 12/20/2015  . Asthma, moderate persistent, poorly-controlled 12/20/2015  . Primary dysmenorrhea 12/20/2015    Mariane Masters ,PT, DPT, E-RYT  05/18/2020, 12:37 PM  [facility] MAIN [facility] SERVICES [address] [place], [place], [address] Phone: [phone]   Fax:  [phone]  Name: Grace [name] MRN: [id number] Date of Birth: [birth year]

## 2020-05-18 NOTE — Patient Instructions (Addendum)
Increase water intake from 2 cups to 32 fl oz   To improve bowel movements  __   Proper body mechanics with getting out of a chair to decrease strain  on back &pelvic floor   Avoid holding your breath when Getting out of the chair:  Scoot to front part of chair chair Heels behind feet, feet are hip width apart, nose over toes  Inhale like you are smelling roses Exhale to stand     Avoid straining pelvic floor, abdominal muscles , spine  Use log rolling technique instead of getting out of bed with your neck or the sit-up     Log rolling into and out of bed   Log rolling into and out of bed If getting out of bed on R side, Bent knees, scoot hips/ shoulder to L  Raise R arm completely overhead, rolling onto armpit  Then lower bent knees to bed to get into complete side lying position  Then drop legs off bed, and push up onto R elbow/forearm, and use L hand to push onto the bed   SCOOTING in bed,  Press elbows and feet into bed and then exhale to scoot the hips and move the knees together incrementally    __  STAND with equal weight on both legs and not shift to one hip and lean one leg  ___   Clam Shell 45 Degrees  Lying with hips and knees bent 45, one pillow between knees and ankles. Heel together, toes apart like ballerina,  Lift knee with exhale while pressing heels together. Be sure pelvis does not roll backward. Do not arch back. Do 15  times, each leg, 2 times per day.     Complimentary stretch: Figure-4  On the bed seated, 45 deg on the bed  5 breaths   ___

## 2020-05-23 ENCOUNTER — Other Ambulatory Visit: Payer: Self-pay

## 2020-05-23 DIAGNOSIS — J4540 Moderate persistent asthma, uncomplicated: Secondary | ICD-10-CM

## 2020-05-23 NOTE — Discharge Summary (Signed)
See progress note from 05/09/20 Genia Del, CNM Certified Nurse Midwife [facility], Department of OB/GYN [facility]

## 2020-05-24 MED ORDER — MONTELUKAST SODIUM 10 MG PO TABS: 10.0000 mg | ORAL_TABLET | Freq: Every day | ORAL | 1 refills | Status: DC

## 2020-05-25 ENCOUNTER — Other Ambulatory Visit: Payer: Self-pay

## 2020-05-25 ENCOUNTER — Encounter: Payer: Medicaid Other | Admitting: Physical Therapy

## 2020-05-25 DIAGNOSIS — O26853 Spotting complicating pregnancy, third trimester: Secondary | ICD-10-CM | POA: Diagnosis not present

## 2020-05-25 DIAGNOSIS — J309 Allergic rhinitis, unspecified: Secondary | ICD-10-CM

## 2020-05-25 DIAGNOSIS — J4540 Moderate persistent asthma, uncomplicated: Secondary | ICD-10-CM

## 2020-05-26 ENCOUNTER — Other Ambulatory Visit: Payer: Self-pay

## 2020-05-26 ENCOUNTER — Observation Stay
Admission: EM | Admit: 2020-05-26 | Discharge: 2020-05-26 | Disposition: A | Discharge status: Home / Self Care | Payer: Medicaid Other | Attending: Obstetrics and Gynecology | Admitting: Obstetrics and Gynecology

## 2020-05-26 ENCOUNTER — Encounter: Payer: Self-pay | Admitting: Obstetrics and Gynecology

## 2020-05-26 DIAGNOSIS — O99333 Smoking (tobacco) complicating pregnancy, third trimester: Secondary | ICD-10-CM | POA: Insufficient documentation

## 2020-05-26 DIAGNOSIS — F17210 Nicotine dependence, cigarettes, uncomplicated: Secondary | ICD-10-CM | POA: Diagnosis not present

## 2020-05-26 DIAGNOSIS — N898 Other specified noninflammatory disorders of vagina: Secondary | ICD-10-CM | POA: Diagnosis present

## 2020-05-26 DIAGNOSIS — O0993 Supervision of high risk pregnancy, unspecified, third trimester: Secondary | ICD-10-CM | POA: Diagnosis not present

## 2020-05-26 DIAGNOSIS — O99513 Diseases of the respiratory system complicating pregnancy, third trimester: Secondary | ICD-10-CM | POA: Insufficient documentation

## 2020-05-26 DIAGNOSIS — O99343 Other mental disorders complicating pregnancy, third trimester: Secondary | ICD-10-CM | POA: Insufficient documentation

## 2020-05-26 DIAGNOSIS — Z3A29 29 weeks gestation of pregnancy: Secondary | ICD-10-CM | POA: Diagnosis not present

## 2020-05-26 DIAGNOSIS — F419 Anxiety disorder, unspecified: Secondary | ICD-10-CM | POA: Insufficient documentation

## 2020-05-26 DIAGNOSIS — F329 Major depressive disorder, single episode, unspecified: Secondary | ICD-10-CM | POA: Insufficient documentation

## 2020-05-26 DIAGNOSIS — O26893 Other specified pregnancy related conditions, third trimester: Secondary | ICD-10-CM | POA: Diagnosis present

## 2020-05-26 DIAGNOSIS — R102 Pelvic and perineal pain: Secondary | ICD-10-CM | POA: Diagnosis not present

## 2020-05-26 DIAGNOSIS — Z79899 Other long term (current) drug therapy: Secondary | ICD-10-CM | POA: Insufficient documentation

## 2020-05-26 DIAGNOSIS — J4550 Severe persistent asthma, uncomplicated: Secondary | ICD-10-CM | POA: Insufficient documentation

## 2020-05-26 LAB — WET PREP, GENITAL
Clue Cells Wet Prep HPF POC: NONE SEEN
Sperm: NONE SEEN
Trich, Wet Prep: NONE SEEN
Yeast Wet Prep HPF POC: NONE SEEN

## 2020-05-26 LAB — URINALYSIS, COMPLETE (UACMP) WITH MICROSCOPIC
Bacteria, UA: NONE SEEN
Bilirubin Urine: NEGATIVE
Glucose, UA: NEGATIVE mg/dL
Hgb urine dipstick: NEGATIVE
Ketones, ur: NEGATIVE mg/dL
Leukocytes,Ua: NEGATIVE
Nitrite: NEGATIVE
Protein, ur: NEGATIVE mg/dL
Specific Gravity, Urine: 1.008 (ref 1.005–1.030)
pH: 7 (ref 5.0–8.0)

## 2020-05-26 LAB — CHLAMYDIA/NGC RT PCR (ARMC ONLY)
Chlamydia Tr: NOT DETECTED
N gonorrhoeae: NOT DETECTED

## 2020-05-26 LAB — RUPTURE OF MEMBRANE (ROM)PLUS: Rom Plus: NEGATIVE

## 2020-05-26 MED ORDER — ACETAMINOPHEN 325 MG PO TABS
650.0000 mg | ORAL_TABLET | ORAL | Status: DC | PRN
  Administered 2020-05-26: 650 mg via ORAL
  Filled 2020-05-26: qty 2
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
## 2020-05-26 NOTE — OB Triage Note (Signed)
Pt co of lower abdominal pain x 3 days. Last BM today, normal per pt. Reports fetal movement. Denies vaginal bleeding. Questionable LOF. Denies having to wear a pad for LOF. Elaina Hoops

## 2020-05-26 NOTE — OB Triage Note (Signed)
Discharge home. Discharge instructions reviewed. Reminded to wear support belt. Left floor by self, ambulatory. Grace [name]

## 2020-05-28 ENCOUNTER — Other Ambulatory Visit: Payer: Self-pay

## 2020-05-29 ENCOUNTER — Encounter: Payer: Medicaid Other | Admitting: Physical Therapy

## 2020-05-29 MED ORDER — CETIRIZINE HCL 10 MG PO TABS: 10.0000 mg | ORAL_TABLET | Freq: Every day | ORAL | 1 refills | Status: DC

## 2020-06-01 ENCOUNTER — Encounter: Payer: Medicaid Other | Admitting: Physical Therapy

## 2020-06-01 ENCOUNTER — Ambulatory Visit: Payer: Medicaid Other | Admitting: Physical Therapy

## 2020-06-07 ENCOUNTER — Ambulatory Visit: Payer: Medicaid Other | Admitting: Physical Therapy

## 2020-06-08 ENCOUNTER — Encounter: Payer: Medicaid Other | Admitting: Physical Therapy

## 2020-06-09 ENCOUNTER — Telehealth: Payer: Self-pay | Admitting: Obstetrics and Gynecology

## 2020-06-09 NOTE — Telephone Encounter (Signed)
Pt is G1P0000 at [redacted]w[redacted]d with c/o unremitting migraine x 6 days. She has taken migraines medication rx yesterday by Dr Elesa Massed- Rx Fioricet 1 tab q4hrs. Reports that she drinks 2 liters of water daily. Having occasional nausea, but not severe.   Discussed comfort measures for migraine, pt has hydrated, taken medication and since HA is unremitting, she should consider being eval in ER for migraine.   Denies any OB complications.   Randa Ngo, CNM 06/09/2020 7:48 PM
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
## 2020-06-12 ENCOUNTER — Ambulatory Visit (INDEPENDENT_AMBULATORY_CARE_PROVIDER_SITE_OTHER): Payer: Medicaid Other | Admitting: Internal Medicine

## 2020-06-12 ENCOUNTER — Other Ambulatory Visit: Payer: Self-pay

## 2020-06-12 ENCOUNTER — Encounter: Payer: Self-pay | Admitting: Internal Medicine

## 2020-06-12 DIAGNOSIS — Z3A31 31 weeks gestation of pregnancy: Secondary | ICD-10-CM

## 2020-06-12 DIAGNOSIS — R519 Headache, unspecified: Secondary | ICD-10-CM | POA: Insufficient documentation

## 2020-06-12 HISTORY — DX: Headache, unspecified: R51.9

## 2020-06-19 ENCOUNTER — Ambulatory Visit: Payer: Medicaid Other | Admitting: Physical Therapy

## 2020-06-22 ENCOUNTER — Encounter: Payer: Medicaid Other | Admitting: Physical Therapy

## 2020-06-26 ENCOUNTER — Encounter: Payer: Self-pay | Admitting: Internal Medicine

## 2020-06-26 ENCOUNTER — Telehealth (INDEPENDENT_AMBULATORY_CARE_PROVIDER_SITE_OTHER): Payer: Medicaid Other | Admitting: Internal Medicine

## 2020-06-26 ENCOUNTER — Ambulatory Visit: Payer: Medicaid Other | Admitting: Physical Therapy

## 2020-06-26 DIAGNOSIS — R059 Cough, unspecified: Secondary | ICD-10-CM

## 2020-06-26 DIAGNOSIS — Z3A33 33 weeks gestation of pregnancy: Secondary | ICD-10-CM | POA: Diagnosis not present

## 2020-06-26 DIAGNOSIS — J069 Acute upper respiratory infection, unspecified: Secondary | ICD-10-CM

## 2020-06-26 NOTE — Progress Notes (Signed)
Name: Grace [name]   MRN: [id number]    DOB: [birth year]   Date:06/26/2020       Progress Note  Subjective  Chief Complaint  Chief Complaint  Patient presents with  . Cough  . URI    I connected with  Grace [name] on 06/26/20 at  1:20 PM EDT by telephone and verified that I am speaking with the correct person using two identifiers.  I discussed the limitations, risks, security and privacy concerns of performing an evaluation and management service by telephone and the availability of in person appointments. The patient expressed understanding and agreed to proceed. Staff also discussed with the patient that there may be a patient responsible charge related to this service. Patient Location: Home Provider Location: [facility] Additional Individuals present: none  HPI  Patient is a 19 year old female patient of [name] At her routine prenatal follow-up at 32 weeks a week ago with OB/GYN with the next follow-up in 2 weeks as she is noted as a high risk pregnancy in third trimester Follows up today with cough and URI symptoms.  She did have the Covid vaccine Notes her symptoms started 3-4 days ago, most problematic part is cough + cough, no production, dry mostly No marked SOB No fever, not feeling feverish No marked sore throat.  No marked congestion, mild PND, is clear No loss of smell, loss of taste + N/V, not vomited today No muscle aches No marked loose stools/diarrhea No CP, passing out episodes Tried taking mucinex, threw up first time took, children's benadryl, not try other medicines as she was concerned as she is pregnant.    Patient Active Problem List   Diagnosis Date Noted  . Acute nonintractable headache 06/12/2020  . Vaginal discharge during pregnancy in third trimester 05/26/2020  . Abdominal pain affecting pregnancy 05/07/2020  . Pelvic pressure in pregnancy 04/04/2020  . Vaginal discharge during pregnancy 03/28/2020  . BV (bacterial vaginosis)  03/02/2019  . Mood disorder (HCC) 11/15/2018  . PTSD (post-traumatic stress disorder) 11/15/2018  . Class 1 obesity due to excess calories without serious comorbidity with body mass index (BMI) of 30.0 to 30.9 in adult 11/15/2018  . Neutropenia (HCC) 08/06/2018  . Major depression, recurrent, chronic (HCC) 08/06/2018  . GAD (generalized anxiety disorder) 08/06/2018  . Panic attack 01/22/2016  . Chronic allergic rhinitis 12/20/2015  . Asthma, moderate persistent, poorly-controlled 12/20/2015  . 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 LAPAROSCOPY  05/07/2017   Procedure: LAPAROSCOPY DIAGNOSTIC;  Surgeon: Feliberto Gottron Ihor Austin, MD;  Location: [facility] ORS;  Service: Gynecology;;    Family History  Problem Relation Age of Onset  . Asthma Father   . Hypertension Father   . ADD / ADHD Brother     Social History   Tobacco Use  . Smoking status: Current Every Day Smoker    Packs/day: 0.25    Years: 2.00    Pack years: 0.50    Start date: 03/01/2017  . Smokeless tobacco: Never Used  . Tobacco comment: using nicotene patches at the moment  Substance Use Topics  . Alcohol use: No    Alcohol/week: 0.0 standard drinks     Current Outpatient Medications:  .  albuterol (VENTOLIN HFA) 108 (90 Base) MCG/ACT inhaler, Inhale 2 puffs into the lungs every 6 (six) hours as needed for wheezing or shortness of breath., Disp: 8 g, Rfl: 2 .  ARIPiprazole (ABILIFY) 5 MG tablet, Take 5 mg by  mouth daily., Disp: , Rfl:  .  cetirizine (ZYRTEC) 10 MG tablet, Take 1 tablet (10 mg total) by mouth daily., Disp: 90 tablet, Rfl: 1 .  cyclobenzaprine (FLEXERIL) 10 MG tablet, Take 1 tablet (10 mg total) by mouth every 8 (eight) hours as needed for muscle spasms., Disp: 30 tablet, Rfl: 0 .  hydrOXYzine (VISTARIL) 25 MG capsule, Take 25 mg by mouth every 8 (eight) hours as needed for anxiety. , Disp: , Rfl:  .  meclizine (ANTIVERT) 25 MG tablet, , Disp: , Rfl:  .   montelukast (SINGULAIR) 10 MG tablet, Take 1 tablet (10 mg total) by mouth at bedtime., Disp: 90 tablet, Rfl: 1 .  ondansetron (ZOFRAN) 4 MG tablet, Take by mouth., Disp: , Rfl:  .  polyethylene glycol powder (GLYCOLAX/MIRALAX) 17 GM/SCOOP powder, Take 17 g by mouth daily., Disp: 3350 g, Rfl: 1 .  Prenatal Vit-Fe Fumarate-FA (PRENATAL MULTIVITAMIN) TABS tablet, Take 1 tablet by mouth daily at 12 noon., Disp: 100 tablet, Rfl: 1 .  sertraline (ZOLOFT) 100 MG tablet, Take 100 mg by mouth daily., Disp: , Rfl:  .  Spacer/Aero-Holding Chambers (OPTICHAMBER Lorelai-LG MASK) DEVI, See admin instructions. use with inhaler, Disp: , Rfl: 0 .  STIMULANT LAXATIVE 8.6-50 MG tablet, Take 2 tablets by mouth at bedtime as needed., Disp: , Rfl:   No Known Allergies  With staff assistance, above reviewed with the patient today.  ROS: As per HPI, otherwise no specific complaints on a limited and focused system review   Objective  Virtual encounter, vitals not obtained.  There is no height or weight on file to calculate BMI.  Physical Exam   Appears in NAD via conversation, mild coughing episode during our conversation Breathing: No obvious respiratory distress. Speaking in complete sentences Neurological: Pt is alert, Speech is normal Psychiatric: Patient has a normal mood and affect, behavior is normal. Judgment and thought content normal.   No results found for this or any previous visit (from the past 72 hour(s)).  PHQ2/9: Depression screen [facility] 2/9 06/26/2020 06/12/2020 11/23/2019 09/06/2019 09/02/2019  Decreased Interest 2 2 0 1 3  Down, Depressed, Hopeless 2 2 2 1 2   PHQ - 2 Score 4 4 2 2 5   Altered sleeping 1 1 0 1 2  Tired, decreased energy 1 1 0 1 3  Change in appetite 1 1 0 1 1  Feeling bad or failure about yourself  3 3 0 1 3  Trouble concentrating 1 1 1 1 1   Moving slowly or fidgety/restless 1 1 1  0 2  Suicidal thoughts 0 0 0 0 0  PHQ-9 Score 12 12 4 7 17   Difficult doing work/chores  Extremely dIfficult Extremely dIfficult Very difficult Somewhat difficult Somewhat difficult  Some recent data might be hidden   PHQ-2/9 Result reviewed  Fall Risk: Fall Risk  06/26/2020 06/12/2020 11/23/2019 09/06/2019 09/02/2019  Falls in the past year? 0 0 0 0 0  Number falls in past yr: 0 0 0 0 0  Injury with Fall? 0 0 0 0 0  Follow up - - - Falls evaluation completed Falls evaluation completed     Assessment & Plan 1. Viral upper respiratory tract infection 2. Cough 3. Pregnant   Noted to patient concern for possible upper respiratory infection, and most are viral in etiology, and likely the case here.  Do not feel rushing to antibiotics is appropriate, especially noting that she is pregnant.  Cannot exclude possible Covid, and given she is pregnant, did  feel getting tested for Covid was appropriate.  She seems somewhat hesitant with this recommendation, and recommended she call her OB/GYN provider, inform them that she called [place], her PCP office who recommended she get tested for Covid, and ensure they are in agreement with this recommendation.  She stated she could do so. Recommended the following for management: Emphasized getting plenty of rest, staying well-hydrated, and recommended warmer products like tea and with honey often helpful. Also lozenges or throat sprays as needed, and can apply a menthol rub to the chest as that sometimes can be helpful. Can continue a plain Mucinex or Robitussin type product as needed in the short-term. Also can use Tylenol products as needed when pregnant. If symptoms not improving or worsening as the week progresses, can follow-up.  I discussed the assessment and treatment plan with the patient. The patient was provided an opportunity to ask questions and all were answered. The patient agreed with the plan and demonstrated an understanding of the instructions.  Red flags and when to present for emergency care or RTC including fevers, chest pain,  shortness of breath, new/worsening/un-resolving symptoms reviewed with patient at time of visit.   The patient was advised to call back or seek an in-person evaluation if the symptoms worsen or if the condition fails to improve as anticipated.  I provided 15 minutes of non-face-to-face time during this encounter that included discussing at length patient's sx/history, pertinent pmhx, medications, treatment and follow up plan. This time also included the necessary documentation, orders, and chart review.  Jamelle Haring, MD

## 2020-06-29 ENCOUNTER — Encounter: Payer: Medicaid Other | Admitting: Physical Therapy

## 2020-06-29 ENCOUNTER — Other Ambulatory Visit: Payer: Medicaid Other

## 2020-07-01 ENCOUNTER — Encounter: Payer: Self-pay | Admitting: Obstetrics and Gynecology

## 2020-07-01 ENCOUNTER — Observation Stay
Admission: EM | Admit: 2020-07-01 | Discharge: 2020-07-01 | Disposition: A | Discharge status: Home / Self Care | Payer: Medicaid Other | Attending: Obstetrics and Gynecology | Admitting: Obstetrics and Gynecology

## 2020-07-01 DIAGNOSIS — Z79899 Other long term (current) drug therapy: Secondary | ICD-10-CM | POA: Insufficient documentation

## 2020-07-01 DIAGNOSIS — O99513 Diseases of the respiratory system complicating pregnancy, third trimester: Secondary | ICD-10-CM | POA: Insufficient documentation

## 2020-07-01 DIAGNOSIS — O26893 Other specified pregnancy related conditions, third trimester: Principal | ICD-10-CM | POA: Insufficient documentation

## 2020-07-01 DIAGNOSIS — R1030 Lower abdominal pain, unspecified: Secondary | ICD-10-CM | POA: Diagnosis not present

## 2020-07-01 DIAGNOSIS — Z3A34 34 weeks gestation of pregnancy: Secondary | ICD-10-CM | POA: Insufficient documentation

## 2020-07-01 DIAGNOSIS — J45909 Unspecified asthma, uncomplicated: Secondary | ICD-10-CM | POA: Insufficient documentation

## 2020-07-01 DIAGNOSIS — O139 Gestational [pregnancy-induced] hypertension without significant proteinuria, unspecified trimester: Secondary | ICD-10-CM | POA: Diagnosis present

## 2020-07-01 DIAGNOSIS — O99333 Smoking (tobacco) complicating pregnancy, third trimester: Secondary | ICD-10-CM | POA: Insufficient documentation

## 2020-07-01 DIAGNOSIS — M545 Low back pain, unspecified: Secondary | ICD-10-CM | POA: Insufficient documentation

## 2020-07-01 DIAGNOSIS — O26899 Other specified pregnancy related conditions, unspecified trimester: Secondary | ICD-10-CM | POA: Diagnosis present

## 2020-07-01 DIAGNOSIS — F17210 Nicotine dependence, cigarettes, uncomplicated: Secondary | ICD-10-CM | POA: Diagnosis not present

## 2020-07-01 LAB — URINALYSIS, ROUTINE W REFLEX MICROSCOPIC
Bilirubin Urine: NEGATIVE
Glucose, UA: NEGATIVE mg/dL
Hgb urine dipstick: NEGATIVE
Ketones, ur: NEGATIVE mg/dL
Leukocytes,Ua: NEGATIVE
Nitrite: NEGATIVE
Protein, ur: NEGATIVE mg/dL
Specific Gravity, Urine: 1.011 (ref 1.005–1.030)
pH: 7 (ref 5.0–8.0)

## 2020-07-01 LAB — WET PREP, GENITAL
Clue Cells Wet Prep HPF POC: NONE SEEN
Sperm: NONE SEEN
Trich, Wet Prep: NONE SEEN

## 2020-07-01 LAB — OB RESULTS CONSOLE GC/CHLAMYDIA
Chlamydia: NEGATIVE
Gonorrhea: NEGATIVE

## 2020-07-01 LAB — CHLAMYDIA/NGC RT PCR (ARMC ONLY)
Chlamydia Tr: NOT DETECTED
N gonorrhoeae: NOT DETECTED

## 2020-07-01 NOTE — OB Triage Note (Signed)
Patient presents today with lower back pain that radiates to the front lower abdomen.  She describes pain as cramping and rates 7/10.  Pains originally began around last Thursday intermittently.  The pain has intensified today.  She reports that she has lost part of her mucus plug and describes it as green in color.  She denies any odor.  No urinary symptoms reported.
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
## 2020-07-01 NOTE — Discharge Instructions (Signed)
Please keep your next follow up appointment.  If you have questions or concerns you may call the on call provider.  For emergent concerns please go to the nearest emergency department.

## 2020-07-03 ENCOUNTER — Ambulatory Visit: Payer: Medicaid Other | Admitting: Physical Therapy

## 2020-07-04 IMAGING — CR DG CHEST 2V
1 series · 2 of 2 positions shown · non-contrast
Comparison: 07/06/2018

CLINICAL DATA: Productive cough and shortness of breath for several
weeks

EXAM:
CHEST - 2 VIEW

[Series 1: dg chest 2 view · 0.14mm/px · 2 of 2 slices shown]
[im 1/2]
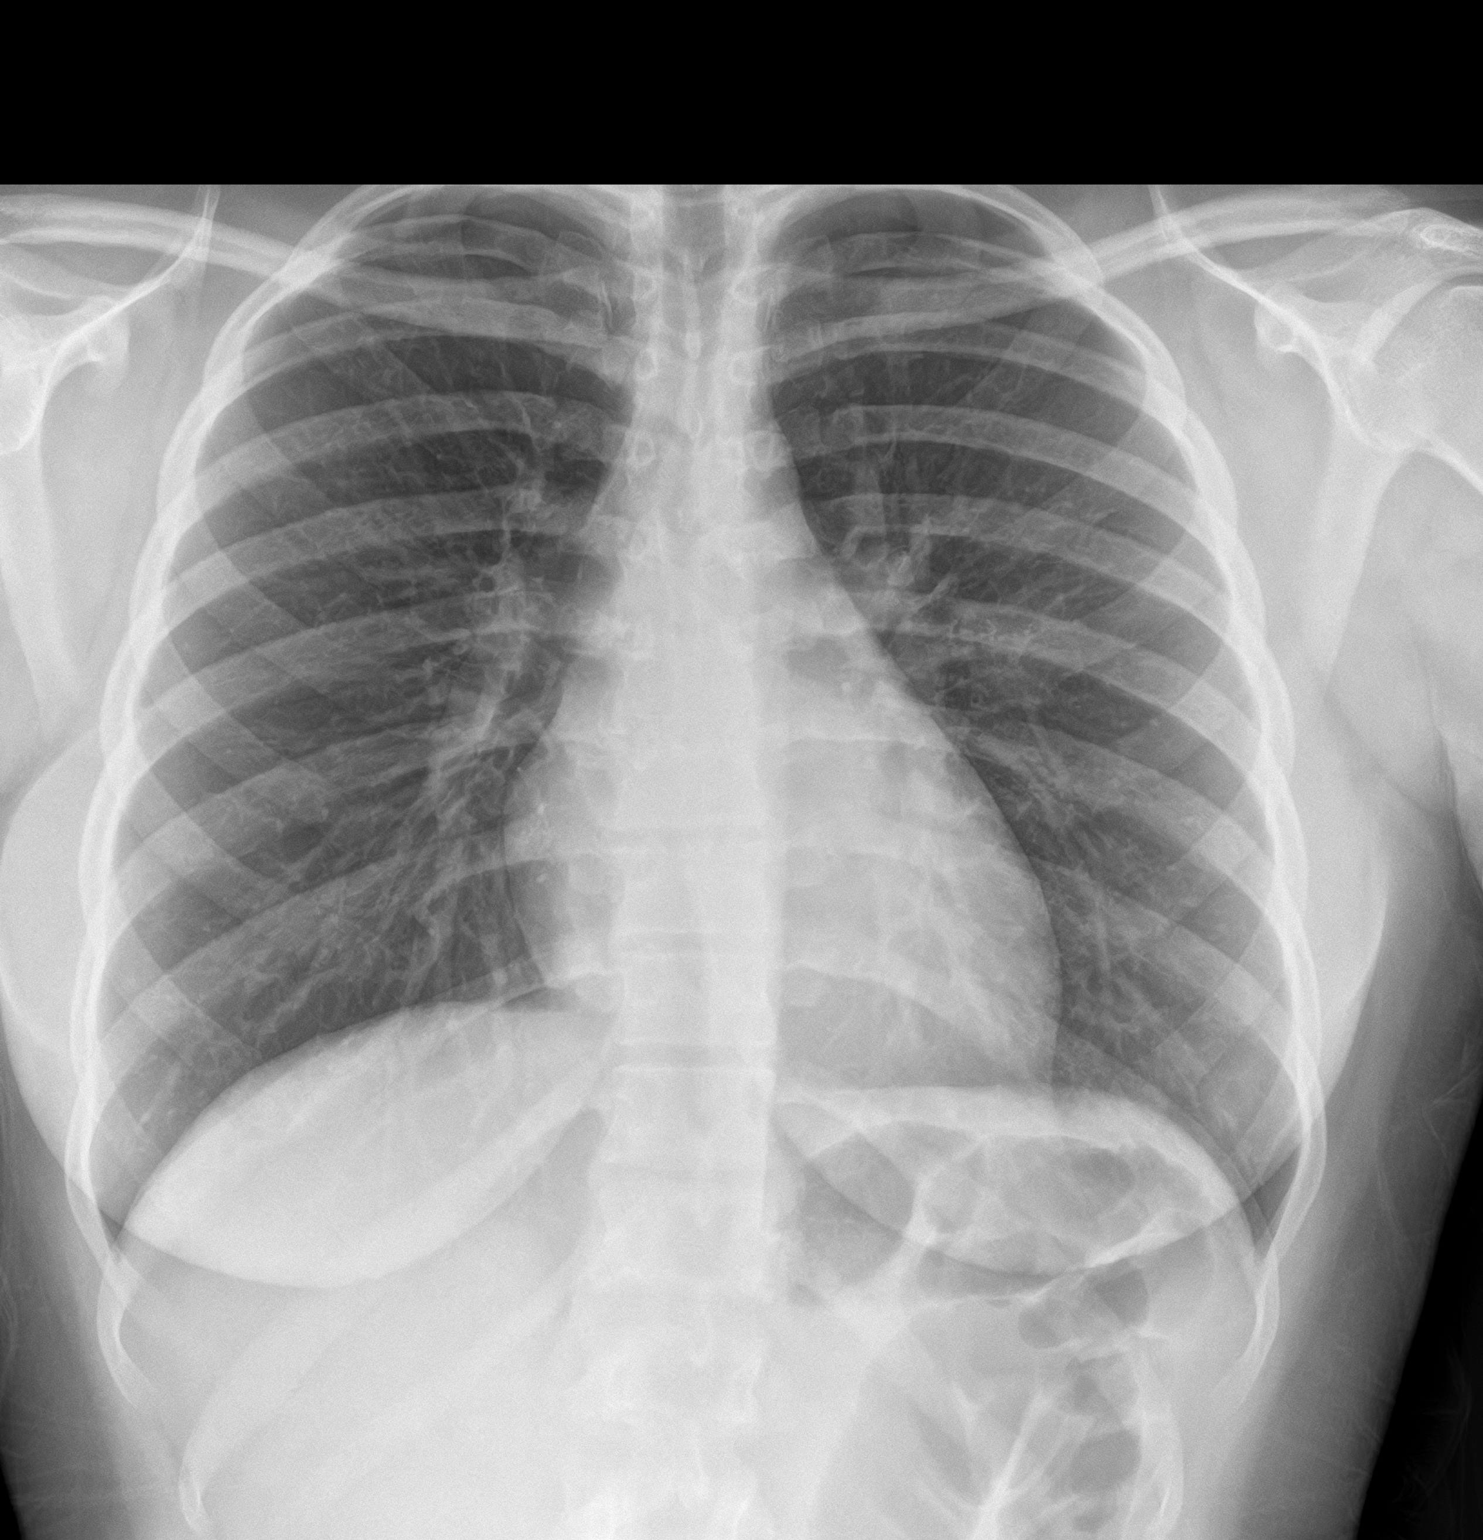
[im 2/2]
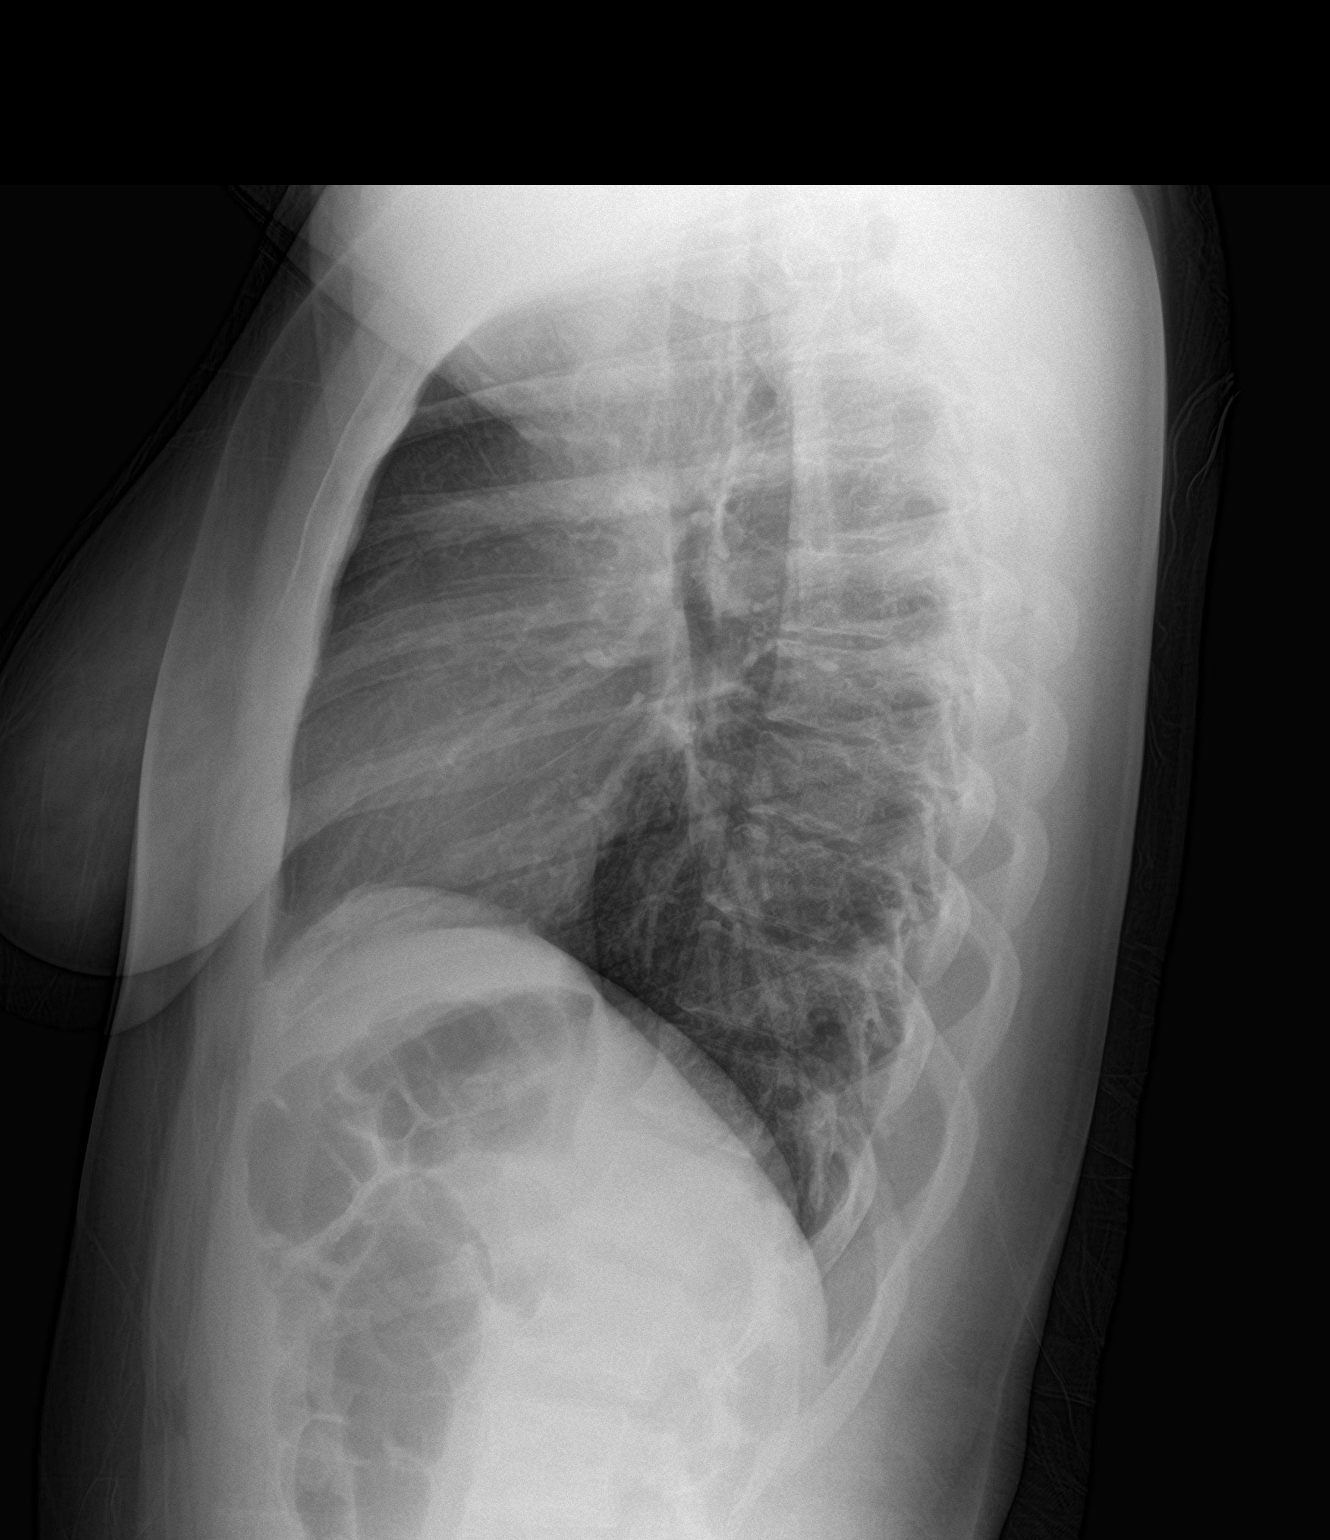

[2 of 2 positions shown; findings below may reference images not displayed]

FINDINGS: The heart size and mediastinal contours are within normal limits.
Both lungs are clear. The visualized skeletal structures are
unremarkable.
IMPRESSION: No active cardiopulmonary disease.

## 2020-07-06 ENCOUNTER — Encounter: Payer: Medicaid Other | Admitting: Physical Therapy

## 2020-07-10 ENCOUNTER — Ambulatory Visit: Payer: Medicaid Other | Attending: Certified Nurse Midwife | Admitting: Physical Therapy

## 2020-07-13 ENCOUNTER — Encounter: Payer: Medicaid Other | Admitting: Physical Therapy

## 2020-07-17 ENCOUNTER — Ambulatory Visit: Payer: Medicaid Other | Admitting: Physical Therapy

## 2020-07-23 ENCOUNTER — Other Ambulatory Visit: Payer: Self-pay

## 2020-07-23 ENCOUNTER — Other Ambulatory Visit: Payer: Self-pay | Admitting: Obstetrics and Gynecology

## 2020-07-23 DIAGNOSIS — O0001 Abdominal pregnancy with intrauterine pregnancy: Secondary | ICD-10-CM

## 2020-07-23 NOTE — Patient Instructions (Signed)
Hi Ms. Jha, thank you for speaking with me today.  Ms. Ruland was given information about Medicaid Managed Care team care coordination services as a part of their [facility] Medicaid benefit. LIA VIGILANTE verbally consented to engagement with the [facility] Managed Care team.   For questions related to your [facility] health plan, please call: [phone]  If you would like to schedule transportation through your [facility] plan, please call the following number at least 2 days in advance of your appointment: [phone]   Patient Care Plan: General Plan of Care (Adult)    Problem Identified: Health Promotion or Disease Self-Management (General Plan of Care)     Goal: Self-Management Plan Developed   Start Date: 07/23/2020  Expected End Date: 10/22/2020  This Visit's Progress: Not on track  Priority: High  Note:   Current Barriers:  . Patient currently 37.[redacted] weeks gestation and experiencing pelvic pain. . Patient unable to attend medical appointment last week due to transportation issues.  Nurse Case Manager Clinical Goal(s):  Marland Kitchen Over the next 30 days, patient will verbalize understanding of plan for transportation needs. . Over the next 30 days, patient will work with medical provider  to address needs related to ongoing pelvic pain . Over the next 30 days, patient will attend all scheduled medical appointments:  Interventions:  . Inter-disciplinary care team collaboration (see longitudinal plan of care) . Evaluation of current treatment plan related to pregnancy and patient's adherence to plan as established by provider. . Advised patient to contact medical provider's office with increasing pain for evaluation. . Reviewed medications with patient. . Discussed plans with patient for ongoing care management follow up and provided patient with direct contact information for care management team . Reviewed scheduled/upcoming provider appointments. . Care Guide referral for  transportation needs.  Patient Goals/Self-Care Activities Over the next 30 days, patient will:  -Patient will attend all scheduled appointments. Patient verbalizes understanding of plan for transportation. Attends all scheduled provider appointments Follow Up Plan: The patient will call call medical provider's office as advised to for any increasing pelvic pain/pressure. Care Guide will contact patient regarding transportation issues. The Managed Medicaid care management team will reach out to the patient again over the next 7-`14 days.  The patient has been provided with contact information for the Managed Medicaid care management team and has been advised to call with any health related questions or concerns.  OB Visit appointment: is scheduled for Wednesday, Dec 1.                  Long-Range Goal: Coping Skills Enhanced   Start Date: 07/23/2020  Expected End Date: 10/22/2020  This Visit's Progress: Not on track  Priority: High  Note:   Patient will follow up with LCSW regarding counseling needs.. Licensed Clinical Social Worker will schedule appointment for patient for evaluation of current needs. The Managed Medicaid care management team will reach out to the patient again over the next 7-14 days.  The patient has been provided with contact information for the Managed Medicaid care management team and has been advised to call with any health related questions or concerns.      Patient verbalizes understanding of instructions provided today.   The Managed Medicaid care management team will reach out to the patient again over the next 7-14 days.  The patient has been provided with contact information for the Managed Medicaid care management team and has been advised to call with any health related questions or concerns.  Kathi Der RN, BSN [place]  Triad Engineer, production - Managed Medicaid High Risk [phone].

## 2020-07-23 NOTE — Patient Outreach (Cosign Needed)
Care Coordination - Case Manager  07/23/2020  Grace [name] 02/12/2001 [id number]  Subjective:  Grace [name] is an 19 y.o. year old female who is a primary patient of Alba Cory, MD.  [name] was given information about Medicaid Managed Care team care coordination services today. [name] agreed to services and verbal consent obtained  Review of patient status, laboratory and other test data was performed as part of evaluation for provision of services.  SDOH: SDOH Screenings   Alcohol Screen: Low Risk   . Last Alcohol Screening Score (AUDIT): 0  Depression (PHQ2-9): Medium Risk  . PHQ-2 Score: 12  Financial Resource Strain:   . Difficulty of Paying Living Expenses: Not on file  Food Insecurity:   . Worried About Programme researcher, broadcasting/film/video in the Last Year: Not on file  . Ran Out of Food in the Last Year: Not on file  Housing:   . Last Housing Risk Score: Not on file  Physical Activity:   . Days of Exercise per Week: Not on file  . Minutes of Exercise per Session: Not on file  Social Connections:   . Frequency of Communication with Friends and Family: Not on file  . Frequency of Social Gatherings with Friends and Family: Not on file  . Attends Religious Services: Not on file  . Active Member of Clubs or Organizations: Not on file  . Attends Banker Meetings: Not on file  . Marital Status: Not on file  Stress:   . Feeling of Stress : Not on file  Tobacco Use: High Risk  . Smoking Tobacco Use: Current Every Day Smoker  . Smokeless Tobacco Use: Never Used  Transportation Needs: Unmet Transportation Needs  . Lack of Transportation (Medical): Yes  . Lack of Transportation (Non-Medical): Not on file   SDOH Interventions     Most Recent Value  SDOH Interventions  Transportation Interventions [facility], Other (Comment)  [referral to care guide, patient given plan information as well.]      Objective:    No Known  Allergies  Medications:    Medications Reviewed Today    Reviewed by Danie Chandler, RN (Registered Nurse) on 07/23/20 at 1112  Med List Status: <None>  Medication Order Taking? Sig Documenting Provider Last Dose Status Informant  albuterol (VENTOLIN HFA) 108 (90 Base) MCG/ACT inhaler [id number] Yes Inhale 2 puffs into the lungs every 6 (six) hours as needed for wheezing or shortness of breath. Lucy Chris, PA Taking Active   ARIPiprazole (ABILIFY) 5 MG tablet [id number] Yes Take 5 mg by mouth daily. [provider] Taking Active Self  cetirizine (ZYRTEC) 10 MG tablet [id number] Yes Take 1 tablet (10 mg total) by mouth daily. Alba Cory, MD Taking Active   cyclobenzaprine (FLEXERIL) 10 MG tablet [id number] Yes Take 1 tablet (10 mg total) by mouth every 8 (eight) hours as needed for muscle spasms. Genia Del, CNM Taking Active   hydrOXYzine (VISTARIL) 25 MG capsule [id number]  Take 25 mg by mouth every 8 (eight) hours as needed for anxiety.   Patient not taking: Reported on 07/01/2020   [provider]  Active Self  meclizine (ANTIVERT) 25 MG tablet [id number] Yes  [provider] Taking Active   montelukast (SINGULAIR) 10 MG tablet [id number] Yes Take 1 tablet (10 mg total) by mouth at bedtime. Alba Cory, MD Taking Active   ondansetron [facility]) 4 MG tablet [id number] Yes Take by mouth. [provider] Taking Active  polyethylene glycol powder (GLYCOLAX/MIRALAX) 17 GM/SCOOP powder [id number] Yes Take 17 g by mouth daily. Alba Cory, MD Taking Active   Prenatal Vit-Fe Fumarate-FA (PRENATAL MULTIVITAMIN) TABS tablet [id number] Yes Take 1 tablet by mouth daily at 12 noon. Alba Cory, MD Taking Active   sertraline (ZOLOFT) 100 MG tablet [id number] Yes Take 100 mg by mouth daily. [provider] Taking Active Self  Spacer/Aero-Holding [place] Listen [employer]-LG [place]) [place] [id number] Yes See admin instructions. use with inhaler  [provider] Taking Active Self  STIMULANT LAXATIVE 8.6-50 MG tablet [id number] Yes Take 2 tablets by mouth at bedtime as needed. [provider] Taking Active   Med List Note Albina Billet, [facility] 11/04/19 [id number]): Pt has been out of most meds for a few weeks          Assessment:   Goals Addressed            This Visit's Progress   . [place] My Quality of Life       Current Barriers:  . Patient currently 37.[redacted] weeks gestation and experiencing pelvic pain. . Patient unable to attend medical appointment last week due to transportation issues.  Nurse Case Manager Clinical Goal(s):  Marland Kitchen Over the next 30 days, patient will verbalize understanding of plan for transportation needs. . Over the next 30 days, patient will work with medical provider  to address needs related to ongoing pelvic pain . Over the next 30 days, patient will attend all scheduled medical appointments:  Interventions:  . Inter-disciplinary care team collaboration (see longitudinal plan of care) . Evaluation of current treatment plan related to pregnancy and patient's adherence to plan as established by provider. . Advised patient to contact medical provider's office with increasing pain for evaluation. . Reviewed medications with patient. . Discussed plans with patient for ongoing care management follow up and provided patient with direct contact information for care management team . Reviewed scheduled/upcoming provider appointments. . Care Guide referral for transportation needs.  Patient Goals/Self-Care Activities Over the next 30 days, patient will:  -Patient will attend all scheduled appointments. Patient verbalizes understanding of plan for transportation. Attends all scheduled provider appointments Follow Up Plan: The patient will call call medical provider's office as advised to for any increasing pelvic pain/pressure. Care Guide will contact patient regarding transportation  issues. The Managed Medicaid care management team will reach out to the patient again over the next 7-`14 days.  The patient has been provided with contact information for the Managed Medicaid care management team and has been advised to call with any health related questions or concerns.  OB Visit appointment: is scheduled for Wednesday, Dec 1.        Patient will follow up with LCSW regarding counseling needs.. Licensed Clinical Social Worker will schedule appointment for patient for evaluation of current needs. The Managed Medicaid care management team will reach out to the patient again over the next 7-14 days.  The patient has been provided with contact information for the Managed Medicaid care management team and has been advised to call with any health related questions or concerns.    Plan: RNCM will follow up with patient within 7 days.

## 2020-07-24 ENCOUNTER — Ambulatory Visit: Payer: Medicaid Other | Admitting: Physical Therapy

## 2020-07-24 ENCOUNTER — Other Ambulatory Visit: Payer: Self-pay | Admitting: Obstetrics and Gynecology

## 2020-07-24 DIAGNOSIS — O0001 Abdominal pregnancy with intrauterine pregnancy: Secondary | ICD-10-CM

## 2020-07-24 NOTE — Patient Outreach (Signed)
Care Coordination  07/24/2020  ANGELEA PENNY [birth year] [id number]  Note opened in error-please disregard.  Kathi Der RN, BSN [place]  Triad Engineer, production - Managed Medicaid High Risk [phone].

## 2020-07-25 ENCOUNTER — Other Ambulatory Visit: Payer: Self-pay

## 2020-07-25 ENCOUNTER — Other Ambulatory Visit: Payer: Self-pay | Admitting: Obstetrics & Gynecology

## 2020-07-25 ENCOUNTER — Ambulatory Visit: Payer: Self-pay

## 2020-07-25 ENCOUNTER — Encounter: Payer: Self-pay | Admitting: Obstetrics & Gynecology

## 2020-07-25 ENCOUNTER — Inpatient Hospital Stay
Admission: EM | Admit: 2020-07-25 | Discharge: 2020-07-28 | DRG: 806 | Disposition: A | Discharge status: Home / Self Care | Payer: Medicaid Other | Attending: Obstetrics and Gynecology | Admitting: Obstetrics and Gynecology

## 2020-07-25 DIAGNOSIS — F1290 Cannabis use, unspecified, uncomplicated: Secondary | ICD-10-CM | POA: Diagnosis present

## 2020-07-25 DIAGNOSIS — O0993 Supervision of high risk pregnancy, unspecified, third trimester: Secondary | ICD-10-CM

## 2020-07-25 DIAGNOSIS — O99324 Drug use complicating childbirth: Secondary | ICD-10-CM | POA: Diagnosis present

## 2020-07-25 DIAGNOSIS — O9952 Diseases of the respiratory system complicating childbirth: Secondary | ICD-10-CM | POA: Diagnosis present

## 2020-07-25 DIAGNOSIS — O9081 Anemia of the puerperium: Secondary | ICD-10-CM | POA: Diagnosis not present

## 2020-07-25 DIAGNOSIS — O134 Gestational [pregnancy-induced] hypertension without significant proteinuria, complicating childbirth: Principal | ICD-10-CM | POA: Diagnosis present

## 2020-07-25 DIAGNOSIS — O163 Unspecified maternal hypertension, third trimester: Secondary | ICD-10-CM | POA: Diagnosis present

## 2020-07-25 DIAGNOSIS — Z3A37 37 weeks gestation of pregnancy: Secondary | ICD-10-CM

## 2020-07-25 DIAGNOSIS — J4550 Severe persistent asthma, uncomplicated: Secondary | ICD-10-CM | POA: Diagnosis present

## 2020-07-25 DIAGNOSIS — Z20822 Contact with and (suspected) exposure to covid-19: Secondary | ICD-10-CM | POA: Diagnosis present

## 2020-07-25 DIAGNOSIS — Z87891 Personal history of nicotine dependence: Secondary | ICD-10-CM

## 2020-07-25 DIAGNOSIS — D62 Acute posthemorrhagic anemia: Secondary | ICD-10-CM | POA: Diagnosis not present

## 2020-07-25 LAB — CBC
HCT: 33.6 % — ABNORMAL LOW (ref 36.0–46.0)
Hemoglobin: 11.1 g/dL — ABNORMAL LOW (ref 12.0–15.0)
MCH: 28.6 pg (ref 26.0–34.0)
MCHC: 33 g/dL (ref 30.0–36.0)
MCV: 86.6 fL (ref 80.0–100.0)
Platelets: 245 10*3/uL (ref 150–400)
RBC: 3.88 MIL/uL (ref 3.87–5.11)
RDW: 13.7 % (ref 11.5–15.5)
WBC: 6 10*3/uL (ref 4.0–10.5)
nRBC: 0 % (ref 0.0–0.2)

## 2020-07-25 LAB — URINALYSIS, COMPLETE (UACMP) WITH MICROSCOPIC
Bilirubin Urine: NEGATIVE
Glucose, UA: NEGATIVE mg/dL
Hgb urine dipstick: NEGATIVE
Ketones, ur: NEGATIVE mg/dL
Nitrite: NEGATIVE
Protein, ur: NEGATIVE mg/dL
Specific Gravity, Urine: 1.005 (ref 1.005–1.030)
pH: 7 (ref 5.0–8.0)

## 2020-07-25 LAB — PROTEIN / CREATININE RATIO, URINE
Creatinine, Urine: 54 mg/dL
Total Protein, Urine: <6 mg/dL

## 2020-07-25 LAB — COMPREHENSIVE METABOLIC PANEL
ALT: 11 U/L (ref 0–44)
AST: 16 U/L (ref 15–41)
Albumin: 3.1 g/dL — ABNORMAL LOW (ref 3.5–5.0)
Alkaline Phosphatase: 209 U/L — ABNORMAL HIGH (ref 38–126)
Anion gap: 7 (ref 5–15)
BUN: 6 mg/dL (ref 6–20)
CO2: 21 mmol/L — ABNORMAL LOW (ref 22–32)
Calcium: 8.6 mg/dL — ABNORMAL LOW (ref 8.9–10.3)
Chloride: 108 mmol/L (ref 98–111)
Creatinine, Ser: 0.66 mg/dL (ref 0.44–1.00)
GFR, Estimated: >60 mL/min (ref >60)
Glucose, Bld: 81 mg/dL (ref 70–99)
Potassium: 4.1 mmol/L (ref 3.5–5.1)
Sodium: 136 mmol/L (ref 135–145)
Total Bilirubin: 0.6 mg/dL (ref 0.3–1.2)
Total Protein: 6.8 g/dL (ref 6.5–8.1)

## 2020-07-25 LAB — CHLAMYDIA/NGC RT PCR (ARMC ONLY)
Chlamydia Tr: NOT DETECTED
N gonorrhoeae: NOT DETECTED

## 2020-07-25 LAB — RESP PANEL BY RT-PCR (FLU A&B, COVID) ARPGX2
Influenza A by PCR: NEGATIVE
Influenza B by PCR: NEGATIVE
SARS Coronavirus 2 by RT PCR: NEGATIVE

## 2020-07-25 LAB — GROUP B STREP BY PCR: Group B strep by PCR: NEGATIVE

## 2020-07-25 LAB — SAMPLE TO BLOOD BANK

## 2020-07-25 MED ORDER — SOD CITRATE-CITRIC ACID 500-334 MG/5ML PO SOLN: 30.0000 mL | ORAL | Status: DC | PRN

## 2020-07-25 MED ORDER — ONDANSETRON HCL 4 MG/2ML IJ SOLN: 4.0000 mg | Freq: Four times a day (QID) | INTRAMUSCULAR | Status: DC | PRN

## 2020-07-25 MED ORDER — OXYTOCIN-SODIUM CHLORIDE 30-0.9 UT/500ML-% IV SOLN
2.5000 [IU]/h | INTRAVENOUS | Status: DC
  Administered 2020-07-26: 2.5 [IU]/h via INTRAVENOUS
  Filled 2020-07-25: qty 1000

## 2020-07-25 MED ORDER — OXYTOCIN BOLUS FROM INFUSION
333.0000 mL | Freq: Once | INTRAVENOUS | Status: AC
  Administered 2020-07-26: 333 mL via INTRAVENOUS

## 2020-07-25 MED ORDER — BUTORPHANOL TARTRATE 1 MG/ML IJ SOLN
1.0000 mg | INTRAMUSCULAR | Status: DC | PRN
  Administered 2020-07-26 (×2): 1 mg via INTRAVENOUS
  Filled 2020-07-25 (×2): qty 1

## 2020-07-25 MED ORDER — TERBUTALINE SULFATE 1 MG/ML IJ SOLN: 0.2500 mg | Freq: Once | INTRAMUSCULAR | Status: DC | PRN

## 2020-07-25 MED ORDER — MISOPROSTOL 200 MCG PO TABS
ORAL_TABLET | ORAL | Status: AC
  Administered 2020-07-25: 25 ug via VAGINAL
  Filled 2020-07-25: qty 4

## 2020-07-25 MED ORDER — LACTATED RINGERS IV SOLN: INTRAVENOUS | Status: DC

## 2020-07-25 MED ORDER — LIDOCAINE HCL (PF) 1 % IJ SOLN
INTRAMUSCULAR | Status: AC
  Administered 2020-07-26: 30 mL via SUBCUTANEOUS
  Filled 2020-07-25: qty 30

## 2020-07-25 MED ORDER — OXYTOCIN-SODIUM CHLORIDE 30-0.9 UT/500ML-% IV SOLN
1.0000 m[IU]/min | INTRAVENOUS | Status: DC
  Administered 2020-07-26: 2 m[IU]/min via INTRAVENOUS

## 2020-07-25 MED ORDER — MISOPROSTOL 25 MCG QUARTER TABLET
25.0000 ug | ORAL_TABLET | ORAL | Status: DC
  Administered 2020-07-25 – 2020-07-26 (×2): 25 ug via BUCCAL
  Filled 2020-07-25 (×2): qty 1

## 2020-07-25 MED ORDER — AMMONIA AROMATIC IN INHA
RESPIRATORY_TRACT | Status: AC
  Filled 2020-07-25: qty 10

## 2020-07-25 MED ORDER — LIDOCAINE HCL (PF) 1 % IJ SOLN: 30.0000 mL | INTRAMUSCULAR | Status: AC | PRN

## 2020-07-25 MED ORDER — MISOPROSTOL 25 MCG QUARTER TABLET
25.0000 ug | ORAL_TABLET | ORAL | Status: DC | PRN
  Administered 2020-07-26: 25 ug via VAGINAL
  Filled 2020-07-25 (×2): qty 1

## 2020-07-25 MED ORDER — ACETAMINOPHEN 500 MG PO TABS: 1000.0000 mg | ORAL_TABLET | Freq: Four times a day (QID) | ORAL | Status: DC | PRN

## 2020-07-25 MED ORDER — LACTATED RINGERS IV SOLN: 500.0000 mL | INTRAVENOUS | Status: DC | PRN

## 2020-07-25 MED ORDER — OXYTOCIN 10 UNIT/ML IJ SOLN
INTRAMUSCULAR | Status: AC
  Filled 2020-07-25: qty 2

## 2020-07-25 MED ORDER — ACETAMINOPHEN 325 MG PO TABS: 650.0000 mg | ORAL_TABLET | ORAL | Status: DC | PRN

## 2020-07-25 NOTE — OB Triage Note (Signed)
Patient sent from office for elevated blood pressures.pt states she had visual disturbances yesterday and has had headaches off and on for 3-4  Days, pt also states her feet and hand are swollen. Pt denies, epigastric pain, N/V. Pt states baby is moving well. Denies any other complaints at this time.
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
## 2020-07-25 NOTE — Patient Instructions (Addendum)
Visit Information Grace [name], it was a pleasure speaking with you today. Please remember that a member of our Team will call you to discuss your transportation issues. Also, another member of our Team will call you to discuss scheduling your appointment for therapy and psychiatry. If you have any questions, please give me a call at [phone].   Grace [name] was given information about Medicaid Managed Care team care coordination services as a part of their [facility] Medicaid benefit. Grace [name] verbally consented to engagement with the [facility] Managed Care team.   For questions related to your [facility] health plan, please call: [phone]  If you would like to schedule transportation through your [facility] plan, please call the following number at least 2 days in advance of your appointment: [phone]   Patient Care Plan: General Plan of Care (Adult)    Problem Identified: Health Promotion or Disease Self-Management (General Plan of Care)     Goal: Self-Management Plan Developed   Start Date: 07/23/2020  Expected End Date: 10/22/2020  This Visit's Progress: Not on track  Priority: High  Note:   Current Barriers:   Patient currently 37.[redacted] weeks gestation and experiencing pelvic pain.  Patient unable to attend medical appointment last week due to transportation issues.  Nurse Case Manager Clinical Goal(s):   Over the next 30 days, patient will verbalize understanding of plan for transportation needs.  Over the next 30 days, patient will work with medical provider  to address needs related to ongoing pelvic pain  Over the next 30 days, patient will attend all scheduled medical appointments:  Interventions:   Inter-disciplinary care team collaboration (see longitudinal plan of care)  Evaluation of current treatment plan related to pregnancy and patient's adherence to plan as established by provider.  Advised patient to contact medical provider's office with  increasing pain for evaluation.  Reviewed medications with patient.  Discussed plans with patient for ongoing care management follow up and provided patient with direct contact information for care management team  Reviewed scheduled/upcoming provider appointments.  Care Guide referral for transportation needs.  Patient Goals/Self-Care Activities Over the next 30 days, patient will:  -Patient will attend all scheduled appointments. Patient verbalizes understanding of plan for transportation. Attends all scheduled provider appointments Follow Up Plan: The patient will call call medical provider's office as advised to for any increasing pelvic pain/pressure. Care Guide will contact patient regarding transportation issues. The Managed Medicaid care management team will reach out to the patient again over the next 7-`14 days.  The patient has been provided with contact information for the Managed Medicaid care management team and has been advised to call with any health related questions or concerns.  OB Visit appointment: is scheduled for Wednesday, Dec 1.       Problem Identified: Coping Skills (General Plan of Care)   Priority: High  Onset Date: 07/23/2020  Note:   Current Barriers:   Care Coordination needs related to anxiety and depression  Transportation barriers   Nurse Case Manager Clinical Goal(s):   Over the next 30 days, patient will work with CM clinical social worker to address needs related to anxiety and depression.  Interventions:   Inter-disciplinary care team collaboration (see longitudinal plan of care)  Collaborated with LCSW regarding patient.  Discussed plans with patient for ongoing care management follow up and provided patient with direct contact information for care management team  Social Work referral for anxiety/depression.  Patient Goals/Self-Care Activities Over the next 30 days, patient  will:  -Patient will speak with LCSW concerning  anxiety and depression.  Follow Up Plan: Patient will follow up with LCSW regarding counseling needs.. Licensed Clinical Social Worker will schedule appointment for patient for evaluation of current needs. The Managed Medicaid care management team will reach out to the patient again over the next 7-14 days.  The patient has been provided with contact information for the Managed Medicaid care management team and has been advised to call with any health related questions or concerns.       Long-Range Goal: Coping Skills Enhanced   Start Date: 07/23/2020  Expected End Date: 10/22/2020  This Visit's Progress: On track  Recent Progress: Not on track  Priority: High  Note:   Patient will follow up with LCSW regarding counseling needs.. Licensed Clinical Social Worker will schedule appointment for patient for evaluation of current needs. The Managed Medicaid care management team will reach out to the patient again over the next 7-14 days.  The patient has been provided with contact information for the Managed Medicaid care management team and has been advised to call with any health related questions or concerns.    Patient Care Plan: General Social Work (Adult)    Problem Identified: Symptoms (Depression)     Goal: Symptoms Monitored and Managed   Start Date: 07/25/2020  Expected End Date: 10/23/2020  This Visit's Progress: On track  Priority: High  Note:   Current Barriers:   Chronic Mental Health needs related to depression and anxiety and past trauma experiences.  Financial constraints related to unemployment.  Transportation  Substance abuse issues -  Patient reports she smoked marijuana until she was about 4 months pregnant. She also smoked cigarettes until about 3 or 4 weeks ago. She now vapes.  Lacks knowledge of community resource: Patient was receiving outpatient therapy at [facility], but her services were terminated because they do not accept her Medicaid plan.    Suicidal Ideation/Homicidal Ideation: No  Clinical Social Work Goal(s):   Over the next 90 days, patient will work with SW monthly by telephone or in person to reduce or manage symptoms related to depression, anxiety and past trauma.  Over the next 60 days, patient will work with SW to address concerns related to depression and anxiety  Over the next 60 days, patient will work with BSW to address needs related to scheduling appointment with appropriate outpatient providers for follow up.   Interventions:  Patient interviewed and appropriate assessments performed: PHQ 2 and PHQ 9  Patient interviewed and appropriate assessments performed  Provided mental health counseling with regard to depression and being a young mother  Provided patient with information about caring for self and utilizing support system after childbirth.  Discussed plans with patient for ongoing care management follow up and provided patient with direct contact information for care management team  Collaborated with RN Case Manager re: outpatient therapy and medication management needs  Collaborated with BSW re: scheduling appointment with [facility] [phone].  Brief CBT, Emotional/Supportive Counseling, Psychoeducation and/or Health Education, and Referred to counselor/psychotherapist  Patient Self Care Activities:   Performs ADL's independently  Performs IADL's independently  Ability for insight  Motivation for treatment  Strong family or social support  Patient Self Care Deficits:   Does not attend all scheduled provider appointments  Former marijuana use    Task: Alleviate Barriers to Depression Treatment   Note:   Care Management Activities:    Schedule patient for appointment for  outpatient therapy and medication management at [facility].    Follow up with patient regarding transportation issues.      Please see  education materials related to depression provided below. Additional information has also been posted in MyChart for you to access and review.   Major Depressive Disorder, Adult Major depressive disorder (MDD) is a mental health condition. MDD often makes you feel sad, hopeless, or helpless. MDD can also cause symptoms in your body. MDD can affect your:  Work.  School.  Relationships.  Other normal activities. MDD can range from mild to very bad. It may occur once (single episode MDD). It can also occur many times (recurrent MDD). The main symptoms of MDD often include:  Feeling sad, depressed, or irritable most of the time.  Loss of interest. MDD symptoms also include:  Sleeping too much or too little.  Eating too much or too little.  A change in your weight.  Feeling tired (fatigue) or having low energy.  Feeling worthless.  Feeling guilty.  Trouble making decisions.  Trouble thinking clearly.  Thoughts of suicide or harming others.  Feeling weak.  Feeling agitated.  Keeping yourself from being around other people (isolation). Follow these instructions at home: Activity  Do these things as told by your doctor: ? Go back to your normal activities. ? Exercise regularly. ? Spend time outdoors. Alcohol  Talk with your doctor about how alcohol can affect your antidepressant medicines.  Do not drink alcohol. Or, limit how much alcohol you drink. ? This means no more than 1 drink a day for nonpregnant women and 2 drinks a day for men. One drink equals one of these:  12 oz of beer.  5 oz of wine.  1 oz of hard liquor. General instructions  Take over-the-counter and prescription medicines only as told by your doctor.  Eat a healthy diet.  Get plenty of sleep.  Find activities that you enjoy. Make time to do them.  Think about joining a support group. Your doctor may be able to suggest a group for you.  Keep all follow-up visits as told by your  doctor. This is important. Where to find more information:  The [employer] on Mental Illness: ? www.nami.org  U.[employer] of Mental Health: ? http://www.maynard.net/www.nimh.nih.gov  National Suicide Prevention Lifeline: ? 747 725 66611-352-496-9060. This is free, 24-hour help. Contact a doctor if:  Your symptoms get worse.  You have new symptoms. Get help right away if:  You self-harm.  You see, hear, taste, smell, or feel things that are not present (hallucinate). If you ever feel like you may hurt yourself or others, or have thoughts about taking your own life, get help right away. You can go to your nearest emergency department or call:  Your local emergency services (911 in the [place]  A suicide crisis helpline, such as the National Suicide Prevention Lifeline: ? 478-048-73491-352-496-9060. This is open 24 hours a day. This information is not intended to replace advice given to you by your health care provider. Make sure you discuss any questions you have with your health care provider. Document Revised: 07/24/2017 Document Reviewed: 04/27/2016 Elsevier Patient Education  2020 [employer]   Patient verbalizes understanding of instructions provided today.  Patient stated she prefers to check MyChart for information.  Licensed Clinical Social Worker will follow up on August 30, 2019 @ 1:30pm. The Managed Medicaid care management team will reach out to the patient again over the next 7 days.  The patient  has been provided with contact information for the Managed Medicaid care management team and has been advised to call with any health related questions or concerns.   Grace [name], BSW, MSW, LCSW Social Work Case Production designer, theatre/television/film - [facility] Managed Care [facility]   [facility]  Direct Dial: [phone]

## 2020-07-26 ENCOUNTER — Encounter: Payer: Self-pay | Admitting: Obstetrics & Gynecology

## 2020-07-26 ENCOUNTER — Inpatient Hospital Stay: Payer: Medicaid Other | Admitting: Anesthesiology

## 2020-07-26 LAB — URINE DRUG SCREEN, QUALITATIVE (ARMC ONLY)
Amphetamines, Ur Screen: NOT DETECTED
Barbiturates, Ur Screen: NOT DETECTED
Benzodiazepine, Ur Scrn: NOT DETECTED
Cannabinoid 50 Ng, Ur ~~LOC~~: NOT DETECTED
Cocaine Metabolite,Ur ~~LOC~~: NOT DETECTED
MDMA (Ecstasy)Ur Screen: NOT DETECTED
Methadone Scn, Ur: NOT DETECTED
Opiate, Ur Screen: NOT DETECTED
Phencyclidine (PCP) Ur S: NOT DETECTED
Tricyclic, Ur Screen: NOT DETECTED

## 2020-07-26 LAB — RPR: RPR Ser Ql: NONREACTIVE

## 2020-07-26 MED ORDER — DIPHENHYDRAMINE HCL 50 MG/ML IJ SOLN: 12.5000 mg | INTRAMUSCULAR | Status: DC | PRN

## 2020-07-26 MED ORDER — EPHEDRINE 5 MG/ML INJ: 10.0000 mg | INTRAVENOUS | Status: DC | PRN

## 2020-07-26 MED ORDER — PHENYLEPHRINE 40 MCG/ML (10ML) SYRINGE FOR IV PUSH (FOR BLOOD PRESSURE SUPPORT): 80.0000 ug | PREFILLED_SYRINGE | INTRAVENOUS | Status: DC | PRN

## 2020-07-26 MED ORDER — FENTANYL 2.5 MCG/ML W/ROPIVACAINE 0.15% IN NS 100 ML EPIDURAL (ARMC)
EPIDURAL | Status: AC
  Filled 2020-07-26: qty 100

## 2020-07-26 MED ORDER — LIDOCAINE HCL (PF) 1 % IJ SOLN
INTRAMUSCULAR | Status: DC | PRN
  Administered 2020-07-26: 3 mL

## 2020-07-26 MED ORDER — ACETAMINOPHEN 325 MG PO TABS
650.0000 mg | ORAL_TABLET | ORAL | Status: DC | PRN
  Administered 2020-07-26 – 2020-07-27 (×3): 650 mg via ORAL
  Filled 2020-07-26 (×3): qty 2

## 2020-07-26 MED ORDER — BUPIVACAINE HCL (PF) 0.25 % IJ SOLN
INTRAMUSCULAR | Status: DC | PRN
  Administered 2020-07-26: 7 mL via EPIDURAL

## 2020-07-26 MED ORDER — IBUPROFEN 600 MG PO TABS
600.0000 mg | ORAL_TABLET | Freq: Four times a day (QID) | ORAL | Status: DC
  Administered 2020-07-26 – 2020-07-28 (×6): 600 mg via ORAL
  Filled 2020-07-26 (×6): qty 1

## 2020-07-26 MED ORDER — FENTANYL 2.5 MCG/ML W/ROPIVACAINE 0.15% IN NS 100 ML EPIDURAL (ARMC)
12.0000 mL/h | EPIDURAL | Status: DC
  Administered 2020-07-26: 12 mL/h via EPIDURAL

## 2020-07-26 MED ORDER — LIDOCAINE-EPINEPHRINE (PF) 1.5 %-1:200000 IJ SOLN
INTRAMUSCULAR | Status: DC | PRN
  Administered 2020-07-26: 3 mL via PERINEURAL

## 2020-07-26 MED ORDER — LACTATED RINGERS IV SOLN
500.0000 mL | Freq: Once | INTRAVENOUS | Status: AC
  Administered 2020-07-26: 500 mL via INTRAVENOUS

## 2020-07-26 MED ORDER — BENZOCAINE-MENTHOL 20-0.5 % EX AERO: 1.0000 "application " | INHALATION_SPRAY | CUTANEOUS | Status: DC | PRN

## 2020-07-26 NOTE — Progress Notes (Signed)
Labor Progress Note  Grace [name] is a 19 y.o. G2P0010 at [redacted]w[redacted]d by LMP admitted for induction of labor due to Hypertension.  Subjective: cramping, denies HA, RUQ pain or VD.  Objective: BP (!) 151/88 (BP Location: Left Arm)   Pulse 60   Temp 97.9 F (36.6 C) (Oral)   Resp 18   Ht 5\' 6"  (1.676 m)   Wt 93 kg   LMP 11/05/2019 (Exact Date)   BMI 33.09 kg/m  Notable VS details: reviewed, BP remains mild range.   Fetal Assessment: FHT:  FHR: 125 bpm, variability: moderate,  accelerations:  Present,  decelerations:  Absent Category/reactivity:  Category I UC:   regular, every 1-3 minutes, Cytotec last placed at 0800 SVE:   Foley balloon placed, pt tolerated well. Cx midposition.  Dilation: 1.5 Effacement (%): 60 Cervical Position: Middle Station: -2 Presentation: Vertex Exam by:: R Nyasia Baxley CNM  Membrane status: intact Amniotic color: n/a  Labs: Lab Results  Component Value Date   WBC 6.0 07/25/2020   HGB 11.1 (L) 07/25/2020   HCT 33.6 (L) 07/25/2020   MCV 86.6 07/25/2020   PLT 245 07/25/2020   Component     Latest Ref Rng & Units 07/25/2020 07/26/2020  Sodium     135 - 145 mmol/L 136   Potassium     3.5 - 5.1 mmol/L 4.1   Chloride     98 - 111 mmol/L 108   CO2     22 - 32 mmol/L 21 (L)   Glucose     70 - 99 mg/dL 81   BUN     6 - 20 mg/dL 6   Creatinine     14/09/2019 - 1.00 mg/dL 4.19   Calcium     8.9 - 10.3 mg/dL 8.6 (L)   Total Protein     6.5 - 8.1 g/dL 6.8   Albumin     3.5 - 5.0 g/dL 3.1 (L)   AST     15 - 41 U/L 16   ALT     0 - 44 U/L 11   Alkaline Phosphatase     38 - 126 U/L 209 (H)   Total Bilirubin     0.3 - 1.2 mg/dL 0.6   GFR, Estimated     >60 mL/min >60   Anion gap     5 - 15 7   Tricyclic, Ur Screen     NONE DETECTED  NONE DETECTED  Amphetamines, Ur Screen     NONE DETECTED  NONE DETECTED  MDMA (Ecstasy)Ur Screen     NONE DETECTED  NONE DETECTED  Cocaine Metabolite,Ur [place]     NONE DETECTED  NONE DETECTED  Opiate, Ur Screen      NONE DETECTED  NONE DETECTED  Phencyclidine (PCP) Ur S     NONE DETECTED  NONE DETECTED  Cannabinoid 50 Ng, Ur [place]     NONE DETECTED  NONE DETECTED  Barbiturates, Ur Screen     NONE DETECTED  NONE DETECTED  Benzodiazepine, Ur Scrn     NONE DETECTED  NONE DETECTED  Methadone Scn, Ur     NONE DETECTED  NONE DETECTED  Creatinine, Urine     mg/dL 54   Total Protein, Urine     mg/dL <6   Protein Creatinine Ratio     0.00 - 0.15 mg/mgCre      Assessment / Plan: IOL due to [facility]  Labor: s/p cytotec x 2 doses, FOley balloon placed now.  Preeclampsia:  intake and ouput balanced and labs stable Fetal Wellbeing:  Category I Pain Control:  IV pain meds I/D:  n/a Anticipated MOD:  NSVD  Prudencio Pair Edith Groleau, CNM 07/26/2020, 8:31 AM

## 2020-07-26 NOTE — Progress Notes (Signed)
Labor Progress Note  Grace [name] is a 19 y.o. G2P0010 at [redacted]w[redacted]d by LMP admitted for induction of labor due to Hypertension.  Subjective: more painful UCs- foley balloon out about 10am  Objective: BP (!) 145/72   Pulse (!) 56   Temp 97.6 F (36.4 C) (Oral)   Resp 18   Ht 5\' 6"  (1.676 m)   Wt 93 kg   LMP 11/05/2019 (Exact Date)   BMI 33.09 kg/m  Notable VS details: reviewed, BP remains mild range.   Fetal Assessment: FHT:  FHR: 135 bpm, variability: moderate,  accelerations:  Present,  decelerations:  Absent Category/reactivity:  Category I UC:   regular, every 1-3 minutes, Cytotec last placed at 0800 SVE:   AROM performed, large amount clear fluid and normal bloody show noted. Pt does not tolerate exam well.  Dilation: 3 Effacement (%): 70 Cervical Position: Middle Station: -1 Presentation: Vertex Exam by:: R Yury Schaus CNM  Membrane status: AROM at 1230 Amniotic color:clear  Labs: Lab Results  Component Value Date   WBC 6.0 07/25/2020   HGB 11.1 (L) 07/25/2020   HCT 33.6 (L) 07/25/2020   MCV 86.6 07/25/2020   PLT 245 07/25/2020   Component     Latest Ref Rng & Units 07/25/2020 07/26/2020  Sodium     135 - 145 mmol/L 136   Potassium     3.5 - 5.1 mmol/L 4.1   Chloride     98 - 111 mmol/L 108   CO2     22 - 32 mmol/L 21 (L)   Glucose     70 - 99 mg/dL 81   BUN     6 - 20 mg/dL 6   Creatinine     14/09/2019 - 1.00 mg/dL 0.96   Calcium     8.9 - 10.3 mg/dL 8.6 (L)   Total Protein     6.5 - 8.1 g/dL 6.8   Albumin     3.5 - 5.0 g/dL 3.1 (L)   AST     15 - 41 U/L 16   ALT     0 - 44 U/L 11   Alkaline Phosphatase     38 - 126 U/L 209 (H)   Total Bilirubin     0.3 - 1.2 mg/dL 0.6   GFR, Estimated     >60 mL/min >60   Anion gap     5 - 15 7   Tricyclic, Ur Screen     NONE DETECTED  NONE DETECTED  Amphetamines, Ur Screen     NONE DETECTED  NONE DETECTED  MDMA (Ecstasy)Ur Screen     NONE DETECTED  NONE DETECTED  Cocaine Metabolite,Ur [place]     NONE DETECTED   NONE DETECTED  Opiate, Ur Screen     NONE DETECTED  NONE DETECTED  Phencyclidine (PCP) Ur S     NONE DETECTED  NONE DETECTED  Cannabinoid 50 Ng, Ur [place]     NONE DETECTED  NONE DETECTED  Barbiturates, Ur Screen     NONE DETECTED  NONE DETECTED  Benzodiazepine, Ur Scrn     NONE DETECTED  NONE DETECTED  Methadone Scn, Ur     NONE DETECTED  NONE DETECTED  Creatinine, Urine     mg/dL 54   Total Protein, Urine     mg/dL <6   Protein Creatinine Ratio     0.00 - 0.15 mg/mgCre      Assessment / Plan: IOL due to [facility]  Labor: s/p cytotec x  2 doses, Foley balloon, Will start Pitocin after pt comfortable with epidural. Preeclampsia:  intake and ouput balanced and labs stable Fetal Wellbeing:  Category I Pain Control: epidural requested after AROM.  I/D:  n/a Anticipated MOD:  NSVD  Randa Ngo, CNM 07/26/2020, 1:21 PM

## 2020-07-26 NOTE — Anesthesia Procedure Notes (Signed)
Epidural Patient location during procedure: OB Start time: 07/26/2020 1:40 PM End time: 07/26/2020 1:52 PM  Staffing Anesthesiologist: Yves Dill, MD Performed: anesthesiologist   Preanesthetic Checklist Completed: patient identified, IV checked, site marked, risks and benefits discussed, surgical consent, monitors and equipment checked, pre-op evaluation and timeout performed  Epidural Patient position: sitting Prep: Betadine Patient monitoring: heart rate, continuous pulse ox and blood pressure Approach: midline Location: L3-L4 Injection technique: LOR air  Needle:  Needle type: Tuohy  Needle gauge: 17 G Needle length: 9 cm and 9 Catheter type: closed end flexible Catheter size: 19 Gauge Test dose: negative and 1.5% lidocaine with Epi 1:200 K  Assessment Sensory level: T8 Events: blood not aspirated, injection not painful, no injection resistance, no paresthesia and negative IV test  Additional Notes Time out called.  Patient placed in sitting position.  Patient somewhat mobile.  Patient placed in sitting position.  Back prepped and draped in sterile fashion.  A skin wheal was made in the L3-L4 interspace with 1% Lidocaine plain.  A 17G Tuohy needle was advanced into the epidural space by a loss of resistance technique .  The epidural catheter was threaded 3 cm and the TD was negative.  The patient tolerated the procedure well and the catheter was affixed to the back in sterile fashion.Reason for block:procedure for pain
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
## 2020-07-26 NOTE — Anesthesia Preprocedure Evaluation (Addendum)
Anesthesia Evaluation  Patient identified by MRN, date of birth, ID band Patient awake    Reviewed: Allergy & Precautions, H&P , NPO status , Patient's Chart, lab work & pertinent test results, reviewed documented beta blocker date and time   Airway Mallampati: II  TM Distance: >3 FB Neck ROM: full    Dental  (+) Teeth Intact   Pulmonary neg pulmonary ROS, asthma , former smoker,    Pulmonary exam normal        Cardiovascular negative cardio ROS Normal cardiovascular exam Rhythm:regular Rate:Normal     Neuro/Psych  Headaches, PSYCHIATRIC DISORDERS Anxiety Depression negative neurological ROS  negative psych ROS   GI/Hepatic negative GI ROS, Neg liver ROS,   Endo/Other  negative endocrine ROS  Renal/GU negative Renal ROS  negative genitourinary   Musculoskeletal   Abdominal   Peds  Hematology negative hematology ROS (+)   Anesthesia Other Findings Past Medical History: No date: Acne No date: Allergy     Comment:  ALLERGIC RHINITIS No date: Anxiety No date: Asthma No date: Deliberate self-cutting No date: Depression No date: Fibrocystic disease of breast No date: Head pain No date: History of self-harm No date: Knee pain, right No date: Primary dysmenorrhea No date: Severe myopia of both eyes History reviewed. No pertinent surgical history. BMI    Body Mass Index:  27.44 kg/m     Reproductive/Obstetrics (+) Pregnancy                             Anesthesia Physical  Anesthesia Plan  ASA: II  Anesthesia Plan: Epidural   Post-op Pain Management:    Induction:   PONV Risk Score and Plan:   Airway Management Planned: Natural Airway  Additional Equipment:   Intra-op Plan:   Post-operative Plan:   Informed Consent: I have reviewed the patients History and Physical, chart, labs and discussed the procedure including the risks, benefits and alternatives for the proposed  anesthesia with the patient or authorized representative who has indicated his/her understanding and acceptance.       Plan Discussed with: CRNA  Anesthesia Plan Comments:         Anesthesia Quick Evaluation

## 2020-07-27 ENCOUNTER — Encounter: Payer: Medicaid Other | Admitting: Physical Therapy

## 2020-07-27 ENCOUNTER — Other Ambulatory Visit: Payer: Self-pay

## 2020-07-27 LAB — COMPREHENSIVE METABOLIC PANEL
ALT: 10 U/L (ref 0–44)
AST: 21 U/L (ref 15–41)
Albumin: 2.6 g/dL — ABNORMAL LOW (ref 3.5–5.0)
Alkaline Phosphatase: 148 U/L — ABNORMAL HIGH (ref 38–126)
Anion gap: 9 (ref 5–15)
BUN: <5 mg/dL — ABNORMAL LOW (ref 6–20)
CO2: 21 mmol/L — ABNORMAL LOW (ref 22–32)
Calcium: 8.8 mg/dL — ABNORMAL LOW (ref 8.9–10.3)
Chloride: 107 mmol/L (ref 98–111)
Creatinine, Ser: 0.68 mg/dL (ref 0.44–1.00)
GFR, Estimated: >60 mL/min (ref >60)
Glucose, Bld: 106 mg/dL — ABNORMAL HIGH (ref 70–99)
Potassium: 3.6 mmol/L (ref 3.5–5.1)
Sodium: 137 mmol/L (ref 135–145)
Total Bilirubin: 0.7 mg/dL (ref 0.3–1.2)
Total Protein: 5.5 g/dL — ABNORMAL LOW (ref 6.5–8.1)

## 2020-07-27 LAB — CBC
HCT: 29.8 % — ABNORMAL LOW (ref 36.0–46.0)
Hemoglobin: 10.2 g/dL — ABNORMAL LOW (ref 12.0–15.0)
MCH: 29.5 pg (ref 26.0–34.0)
MCHC: 34.2 g/dL (ref 30.0–36.0)
MCV: 86.1 fL (ref 80.0–100.0)
Platelets: 205 10*3/uL (ref 150–400)
RBC: 3.46 MIL/uL — ABNORMAL LOW (ref 3.87–5.11)
RDW: 13.7 % (ref 11.5–15.5)
WBC: 11.8 10*3/uL — ABNORMAL HIGH (ref 4.0–10.5)
nRBC: 0 % (ref 0.0–0.2)

## 2020-07-27 MED ORDER — ALBUTEROL SULFATE HFA 108 (90 BASE) MCG/ACT IN AERS
2.0000 | INHALATION_SPRAY | Freq: Four times a day (QID) | RESPIRATORY_TRACT | Status: DC | PRN
  Filled 2020-07-27: qty 6.7

## 2020-07-27 MED ORDER — COCONUT OIL OIL: 1.0000 "application " | TOPICAL_OIL | Status: DC | PRN

## 2020-07-27 MED ORDER — SENNOSIDES-DOCUSATE SODIUM 8.6-50 MG PO TABS
2.0000 | ORAL_TABLET | ORAL | Status: DC
  Administered 2020-07-27 – 2020-07-28 (×2): 2 via ORAL
  Filled 2020-07-27 (×2): qty 2

## 2020-07-27 MED ORDER — LORATADINE 10 MG PO TABS
10.0000 mg | ORAL_TABLET | Freq: Every day | ORAL | Status: DC
  Administered 2020-07-27 – 2020-07-28 (×2): 10 mg via ORAL
  Filled 2020-07-27 (×2): qty 1

## 2020-07-27 MED ORDER — FERROUS SULFATE 325 (65 FE) MG PO TABS
325.0000 mg | ORAL_TABLET | Freq: Two times a day (BID) | ORAL | Status: DC
  Administered 2020-07-27 – 2020-07-28 (×3): 325 mg via ORAL
  Filled 2020-07-27 (×3): qty 1

## 2020-07-27 MED ORDER — WITCH HAZEL-GLYCERIN EX PADS
1.0000 "application " | MEDICATED_PAD | CUTANEOUS | Status: DC | PRN
  Filled 2020-07-27: qty 100

## 2020-07-27 MED ORDER — PRENATAL MULTIVITAMIN CH
1.0000 | ORAL_TABLET | Freq: Every day | ORAL | Status: DC
  Administered 2020-07-27: 1 via ORAL
  Filled 2020-07-27: qty 1

## 2020-07-27 MED ORDER — ONDANSETRON HCL 4 MG PO TABS: 4.0000 mg | ORAL_TABLET | ORAL | Status: DC | PRN

## 2020-07-27 MED ORDER — ONDANSETRON HCL 4 MG/2ML IJ SOLN: 4.0000 mg | INTRAMUSCULAR | Status: DC | PRN

## 2020-07-27 MED ORDER — TETANUS-DIPHTH-ACELL PERTUSSIS 5-2.5-18.5 LF-MCG/0.5 IM SUSY: 0.5000 mL | PREFILLED_SYRINGE | Freq: Once | INTRAMUSCULAR | Status: DC

## 2020-07-27 MED ORDER — SIMETHICONE 80 MG PO CHEW: 80.0000 mg | CHEWABLE_TABLET | ORAL | Status: DC | PRN

## 2020-07-27 MED ORDER — POLYETHYLENE GLYCOL 3350 17 GM/SCOOP PO POWD: 17.0000 g | Freq: Every day | ORAL | Status: DC

## 2020-07-27 MED ORDER — NIFEDIPINE ER OSMOTIC RELEASE 30 MG PO TB24
30.0000 mg | ORAL_TABLET | Freq: Every day | ORAL | Status: DC
  Administered 2020-07-27 – 2020-07-28 (×2): 30 mg via ORAL
  Filled 2020-07-27 (×2): qty 1

## 2020-07-27 MED ORDER — DIPHENHYDRAMINE HCL 25 MG PO CAPS: 25.0000 mg | ORAL_CAPSULE | Freq: Four times a day (QID) | ORAL | Status: DC | PRN

## 2020-07-27 MED ORDER — SERTRALINE HCL 100 MG PO TABS
100.0000 mg | ORAL_TABLET | Freq: Every day | ORAL | Status: DC
  Administered 2020-07-27 – 2020-07-28 (×2): 100 mg via ORAL
  Filled 2020-07-27 (×2): qty 1

## 2020-07-27 MED ORDER — ARIPIPRAZOLE 5 MG PO TABS
5.0000 mg | ORAL_TABLET | Freq: Every day | ORAL | Status: DC
  Administered 2020-07-27 – 2020-07-28 (×2): 5 mg via ORAL
  Filled 2020-07-27 (×2): qty 1

## 2020-07-27 MED ORDER — MONTELUKAST SODIUM 10 MG PO TABS
10.0000 mg | ORAL_TABLET | Freq: Every day | ORAL | Status: DC
  Administered 2020-07-27 (×2): 10 mg via ORAL
  Filled 2020-07-27 (×3): qty 1

## 2020-07-27 MED ORDER — ZOLPIDEM TARTRATE 5 MG PO TABS: 5.0000 mg | ORAL_TABLET | Freq: Every evening | ORAL | Status: DC | PRN

## 2020-07-27 MED ORDER — DIBUCAINE (PERIANAL) 1 % EX OINT
1.0000 "application " | TOPICAL_OINTMENT | CUTANEOUS | Status: DC | PRN
  Filled 2020-07-27: qty 28

## 2020-07-27 MED ORDER — POLYETHYLENE GLYCOL 3350 17 G PO PACK
17.0000 g | PACK | Freq: Every day | ORAL | Status: DC
  Filled 2020-07-27 (×2): qty 1

## 2020-07-27 NOTE — Lactation Note (Addendum)
This note was copied from a baby's chart. Lactation Consultation Note  Patient Name: Boy Alma Mohiuddin BMWUX'L Date: 07/27/2020 Reason for consult: Initial assessment;Primapara;Early term 37-38.6wks   Maternal Data Formula Feeding for Exclusion: No Has patient been taught Hand Expression?: Yes Does the patient have breastfeeding experience prior to this delivery?: No Mom has expressed interest in only pump and bottlefeeding at home, encouraged mom to contact [facility] today to obtain an electric breast pump for home use, mom states she has a manual breast pump at home. WIC referral faxed to [place]IC. Feeding Feeding Type: Breast Fed Assisted mom with latching baby to left breast in side lying position, baby rooting and latched easily to left breast and is nursing well with little stimulation, swallows observed and heard, mom encouraged to continue to breast feed as baby is doing well at the breast, encouraged to breast feed baby at least 8 times in 24 hrs.  LATCH Score Latch: Grasps breast easily, tongue down, lips flanged, rhythmical sucking.  Audible Swallowing: A few with stimulation  Type of Nipple: Everted at rest and after stimulation  Comfort (Breast/Nipple): Soft / non-tender  Hold (Positioning): Assistance needed to correctly position infant at breast and maintain latch.  LATCH Score: 8  Interventions Interventions: Breast feeding basics reviewed;Assisted with latch;Skin to skin;Hand express;Breast compression;Adjust position;Support pillows  Lactation Tools Discussed/Used WIC Program: Yes LC name and no written on white board  Consult Status Consult Status: PRN    [name] 07/27/2020, 11:41 AM

## 2020-07-27 NOTE — Progress Notes (Signed)
Post Partum  Day 1  Subjective: no complaints, up ad lib, voiding, tolerating PO and + flatus  Doing well, no concerns. Ambulating without difficulty, pain managed with PO meds, tolerating regular diet, and voiding without difficulty.   No fever/chills, chest pain, shortness of breath, nausea/vomiting, or leg pain. No nipple or breast pain. No headache, visual changes, or RUQ/epigastric pain.  Objective: BP (!) 147/85 (BP Location: Left Arm) Comment: nurse Jenna notified  Pulse 60   Temp 97.7 F (36.5 C) (Oral)   Resp 20   Ht 5\' 6"  (1.676 m)   Wt 93 kg   LMP 11/05/2019 (Exact Date)   SpO2 97% Comment: Room Air  Breastfeeding Unknown   BMI 33.09 kg/m    Physical Exam:  General: alert, cooperative and no distress Breasts: soft/nontender CV: RRR Pulm: nl effort, CTABL Abdomen: soft, non-tender, active bowel sounds Uterine Fundus: firm Perineum: minimal edema Lochia: appropriate DVT Evaluation: No evidence of DVT seen on physical exam.  Recent Labs    07/25/20 1836 07/27/20 0627  HGB 11.1* 10.2*  HCT 33.6* 29.8*  WBC 6.0 11.8*  PLT 245 205    Assessment/Plan: 19 y.o. G2P1011 postpartum day # 1  -Continue routine postpartum care -Lactation consult PRN for breastfeeding.  Desires to start pumping, LC will set up with pump -Gestational hypertension with continued mild range blood pressures.  Will start on Procardia 30mg  XL today  -Acute blood loss anemia - hemodynamically stable and asymptomatic; start PO ferrous sulfate BID with stool softeners  -Immunization status: all immunizations up to date  Disposition: Continue inpatient postpartum care    LOS: 2 days   14/03/21, CNM 07/27/2020, 9:05 AM   ----- Gustavo Lah  Certified Nurse Midwife [facility]

## 2020-07-27 NOTE — Progress Notes (Signed)
Patient pain assessed and mother has complaints of perineal pain rating 5/10. Scheduled motrin and PRN tylenol given without relief for perineal pain (helped abdominal cramping). Both M/B and [facility] pxyis out of dermoplast spray. Pharmacy notified and stated that pharmacy has none on hand but will retreive some from [facility].  Tucks pads, dibucaine cream and ice packs given to patient. RN educated patient about how to apply to perineum for relief. RN offered to assist patient but patient declined at this time and laid down for a nap while infant in bassinet.

## 2020-07-27 NOTE — Anesthesia Postprocedure Evaluation (Signed)
Anesthesia Post Note  Patient: Grace [name]  Procedure(s) Performed: AN AD HOC LABOR EPIDURAL  Patient location during evaluation: Mother Baby Anesthesia Type: Epidural Level of consciousness: awake and alert Pain management: pain level controlled Vital Signs Assessment: post-procedure vital signs reviewed and stable Respiratory status: spontaneous breathing, nonlabored ventilation and respiratory function stable Cardiovascular status: stable Postop Assessment: no headache, no backache and epidural receding Anesthetic complications: no   No complications documented.   Last Vitals:  Vitals:   07/27/20 0140 07/27/20 0505  BP: (!) 148/84 127/83  Pulse: 63 75  Resp: 20 18  Temp: 36.8 C 36.8 C  SpO2: 99% 99%    Last Pain:  Vitals:   07/27/20 0505  TempSrc: Oral  PainSc:                  Rica Mast

## 2020-07-28 ENCOUNTER — Encounter: Payer: Self-pay | Admitting: Obstetrics & Gynecology

## 2020-07-28 MED ORDER — ACETAMINOPHEN 325 MG PO TABS: 650.0000 mg | ORAL_TABLET | ORAL | Status: DC | PRN

## 2020-07-28 MED ORDER — BLOOD PRESSURE KIT: 1.0000 | PACK | Freq: Every day | 0 refills | Status: DC

## 2020-07-28 MED ORDER — IBUPROFEN 600 MG PO TABS: 600.0000 mg | ORAL_TABLET | Freq: Four times a day (QID) | ORAL | 1 refills | Status: DC | PRN

## 2020-07-28 MED ORDER — NIFEDIPINE ER 60 MG PO TB24: 60.0000 mg | ORAL_TABLET | Freq: Every day | ORAL | 1 refills | Status: DC

## 2020-07-28 MED ORDER — NIFEDIPINE ER OSMOTIC RELEASE 30 MG PO TB24: 60.0000 mg | ORAL_TABLET | Freq: Every day | ORAL | Status: DC

## 2020-07-28 MED ORDER — NIFEDIPINE ER OSMOTIC RELEASE 30 MG PO TB24
30.0000 mg | ORAL_TABLET | Freq: Once | ORAL | Status: AC
  Administered 2020-07-28: 30 mg via ORAL
  Filled 2020-07-28: qty 1

## 2020-07-28 NOTE — Progress Notes (Signed)
Patient discharged home with infant. Discharge instructions and prescriptions given and reviewed with patient. PP HTN reviewed with pt and pt to get BP machine from pharmacy. Patient verbalized understanding and to make appointment to f/u next week 12/8. Escorted out by auxillary.

## 2020-07-28 NOTE — Discharge Instructions (Signed)
Discharge Instructions:   Follow-up Appointment:  If there are any new medications, they have been ordered and will be available for pickup at the listed pharmacy on your way home from the hospital.   Call office if you have any of the following: headache, visual changes, fever >101.0 F, chills, shortness of breath, breast concerns, excessive vaginal bleeding, incision drainage or problems, leg pain or redness, depression or any other concerns. If you have vaginal discharge with an odor, let your doctor know.   It is normal to bleed for up to 6 weeks. You should not soak through more than 1 pad in 1 hour. If you have a blood clot larger than your fist with continued bleeding, call your doctor.   Activity: Do not lift > 10 lbs for 6 weeks (do not lift anything heavier than your baby). No intercourse, tampons, swimming pools, hot tubs, baths (only showers) for 6 weeks.  No driving for 1-2 weeks. Continue prenatal vitamin, especially if breastfeeding. Increase calories and fluids (water) while breastfeeding.   Your milk will come in, in the next couple of days (right now it is colostrum). You may have a slight fever when your milk comes in, but it should go away on its own.  If it does not, and rises above 101 F please call the doctor. You will also feel achy and your breasts will be firm. They will also start to leak. If you are breastfeeding, continue as you have been and you can pump/express milk for comfort.   If you have too much milk, your breasts can become engorged, which could lead to mastitis. This is an infection of the milk ducts. It can be very painful and you will need to notify your doctor to obtain a prescription for antibiotics. You can also treat it with a shower or hot/cold compress.   For concerns about your baby, please call your pediatrician.  For breastfeeding concerns, the lactation consultant can be reached at [phone].   Postpartum blues (feelings of happy one minute  and sad another minute) are normal for the first few weeks but if it gets worse let your doctor know.   Congratulations! We enjoyed caring for you and your new bundle of joy!  

## 2020-07-30 ENCOUNTER — Other Ambulatory Visit: Payer: Self-pay | Admitting: Obstetrics and Gynecology

## 2020-07-30 ENCOUNTER — Other Ambulatory Visit: Payer: Self-pay

## 2020-07-30 NOTE — Patient Outreach (Signed)
Care Coordination  07/30/2020  Grace [name] 10/09/2000 [id number]   RNCM called patient at scheduled time.  Patient requested that I call another day/time.  Patient delivered 07/26/20 and discharged from hospital 07/28/20.  Baby crying in the background.  RNCM and patient agreed on a mutual time.  Kathi Der RN, BSN [place]  [employer], production - Managed Medicaid High Risk [phone].

## 2020-07-31 ENCOUNTER — Ambulatory Visit: Payer: Medicaid Other | Admitting: Physical Therapy

## 2020-08-06 ENCOUNTER — Other Ambulatory Visit: Payer: Self-pay

## 2020-08-07 NOTE — Patient Instructions (Signed)
Visit Information  Grace [name] was given information about Medicaid Managed Care team care coordination services as a part of their [facility] Medicaid benefit. [name] verbally consented to engagement with the [facility] Managed Care team.   An associate from [facility] will be contacting you to get you scheduled for a new patient appointment.  For questions related to your [facility] health plan, please call: [phone]  If you would like to schedule transportation through your [facility] plan, please call the following number at least 2 days in advance of your appointment: [phone]   Social Worker will follow up with patient in 14 days.[name]

## 2020-08-28 ENCOUNTER — Other Ambulatory Visit: Payer: Self-pay | Admitting: Obstetrics and Gynecology

## 2020-08-28 ENCOUNTER — Other Ambulatory Visit: Payer: Self-pay

## 2020-08-28 NOTE — Patient Instructions (Signed)
Hi Grace [name] you for speaking with me today.  Grace [name] was given information about Medicaid Managed Care team care coordination services as a part of their [facility] Medicaid benefit. Grace [name] verbally consented to engagement with the [facility] Managed Care team.   For questions related to your [facility] health plan, please call: [phone]  If you would like to schedule transportation through your [facility] plan, please call the following number at least 2 days in advance of your appointment: [phone]  Goals Addressed            This Visit's Progress   . [place] My Quality of Life       Current Barriers:  . Patient currently 37.[redacted] weeks gestation and experiencing pelvic pain. . Patient unable to attend medical appointment last week due to transportation issues.  Nurse Case Manager Clinical Goal(s):  Marland Kitchen Over the next 30 days, patient will verbalize understanding of plan for transportation needs. . Over the next 30 days, patient will work with medical provider  to address needs related to ongoing pelvic pain . Update 08/28/20:  Patient to get PT referral from provider for outpatient PT services at visit. . Over the next 30 days, patient will attend all scheduled medical appointments:  Interventions:  . Inter-disciplinary care team collaboration (see longitudinal plan of care) . Evaluation of current treatment plan related to pregnancy and patient's adherence to plan as established by provider. . Advised patient to contact medical provider's office with increasing pain for evaluation. . Reviewed medications with patient. . Discussed plans with patient for ongoing care management follow up and provided patient with direct contact information for care management team . Reviewed scheduled/upcoming provider appointments. . Care Guide referral for transportation needs.  Patient Goals/Self-Care Activities Over the next 30 days, patient will:  -Patient will  attend all scheduled appointments. Patient verbalizes understanding of plan for transportation. Attends all scheduled provider appointments Follow Up Plan: The patient will call call medical provider's office as advised to for any increasing pelvic pain/pressure. Care Guide will contact patient regarding transportation issues. The Managed Medicaid care management team will reach out to the patient again over the next 7-`14 days.  The patient has been provided with contact information for the Managed Medicaid care management team and has been advised to call with any health related questions or concerns.  OB Visit appointment: is scheduled for Wednesday, Dec 1. Update 08/28/20: SVD 07/26/20.    Problem Identified: Coping Skills (General Plan of Care)   Priority: High  Onset Date: 07/23/2020  Note:   Current Barriers:  . Care Coordination needs related to anxiety and depression . Transportation barriers   Nurse Case Manager Clinical Goal(s):  Marland Kitchen Over the next 30 days, patient will work with CM clinical social worker to address needs related to anxiety and depression.  Interventions:  . Inter-disciplinary care team collaboration (see longitudinal plan of care) . Collaborated with LCSW regarding patient. . Discussed plans with patient for ongoing care management follow up and provided patient with direct contact information for care management team . Social Work referral for anxiety/depression.  Patient Goals/Self-Care Activities Over the next 30 days, patient will:  -Patient will speak with LCSW concerning anxiety and depression.  Follow Up Plan: Patient will follow up with LCSW regarding counseling needs.. Licensed Clinical Social Worker will schedule appointment for patient for evaluation of current needs. The Managed Medicaid care management team will reach out to the patient again over the next 7-14 days.  The patient has been provided with contact information for the Managed  Medicaid care management team and has been advised to call with any health related questions or concerns.       Long-Range Goal: Coping Skills Enhanced   Start Date: 07/23/2020  Expected End Date: 10/22/2020  This Visit's Progress: On track  Recent Progress: Not on track  Priority: High  Note:   Patient will follow up with LCSW regarding counseling needs.. Licensed Clinical Social Worker will schedule appointment for patient for evaluation of current needs. The Managed Medicaid care management team will reach out to the patient again over the next 7-14 days.  The patient has been provided with contact information for the Managed Medicaid care management team and has been advised to call with any health related questions or concerns.    Patient Care Plan: General Social Work (Adult)    Problem Identified: Symptoms (Depression)     Goal: Symptoms Monitored and Managed   Start Date: 07/25/2020  Expected End Date: 10/23/2020  Recent Progress: On track  Priority: High  Note:   Current Barriers:  . Chronic Mental Health needs related to depression and anxiety and past trauma experiences. . Financial constraints related to unemployment. . Transportation . Substance abuse issues -  Patient reports she smoked marijuana until she was about 4 months pregnant. She also smoked cigarettes until about 3 or 4 weeks ago. She now vapes. Grace [name] knowledge of community resource: Patient was receiving outpatient therapy at [facility], but her services were terminated because they do not accept her Medicaid plan.  . Suicidal Ideation/Homicidal Ideation: No  Clinical Social Work Goal(s):  Marland Kitchen Over the next 90 days, patient will work with SW monthly by telephone or in person to reduce or manage symptoms related to depression, anxiety and past trauma. . Over the next 60 days, patient will work with SW to address concerns related to depression and anxiety . Over the next 60 days, patient will work with  BSW to address needs related to scheduling appointment with appropriate outpatient providers for follow up.   Interventions: . Patient interviewed and appropriate assessments performed: PHQ 2 and PHQ 9 . Patient interviewed and appropriate assessments performed . Provided mental health counseling with regard to depression and being a young mother . Provided patient with information about caring for self and utilizing support system after childbirth. . Discussed plans with patient for ongoing care management follow up and provided patient with direct contact information for care management team . Collaborated with RN Case Manager re: outpatient therapy and medication management needs . Collaborated with BSW re: scheduling appointment with [facility] [phone]. . Brief CBT, Emotional/Supportive Counseling, Psychoeducation and/or Health Education, and Referred to counselor/psychotherapist . BSW contacted [facility] to make a new patient appointment. BSW was informed that a referral has to be submitted. BSW will send referral. Referral sent on 08/06/20.  Patient Self Care Activities:  . Performs ADL's independently . Performs IADL's independently . Ability for insight . Motivation for treatment . Strong family or social support  Patient Self Care Deficits:  . Does not attend all scheduled provider appointments . Former marijuana use           Patient Care Plan: Hypertension (Adult)    Problem Identified: Hypertension (Hypertension)     Long-Range Goal: Hypertension Monitored   Start Date: 08/28/2020  Expected End Date: 11/26/2020  This Visit's Progress: Not on track  Priority: High  Note:  Evidence-based guidance:   Promote initial use of ambulatory blood pressure measurements (for 3 days) to rule out "white-coat" effect; identify masked hypertension and presence or absence of nocturnal "dipping" of blood pressure.     Encourage continued use of home blood pressure monitoring and recording in blood pressure log; include symptoms of hypotension or potential medication side effects in log.   Review blood pressure measurements taken inside and outside of the provider office; establish baseline and monitor trends; compare to target ranges or patient goal.   Share overall cardiovascular risk with patient; encourage changes to lifestyle risk factors, including alcohol consumption, smoking, inadequate exercise, poor dietary habits and stress.              CARE PLAN ENTRY Medicaid Managed Care (see longtitudinal plan of care for additional care plan information)  Objective:  . Last practice recorded BP readings:  BP Readings from Last 3 Encounters:  07/28/20 139/85  07/01/20 129/83  05/26/20 (!) 119/56  .  Marland Kitchen Most recent eGFR/CrCl: No results found for: EGFR  No components found for: CRCL  Current Barriers:  Marland Kitchen Knowledge Deficits related to basic understanding of hypertension pathophysiology and self care management  Case Manager Clinical Goal(s):  Marland Kitchen Over the next 30 days, patient will verbalize understanding of plan for hypertension management . Over the next 30 days, patient will attend all scheduled medical appointments:  Interventions:  . Evaluation of current treatment plan related to hypertension self management and patient's adherence to plan as established by provider. . Discussed plans with patient for ongoing care management follow up and provided patient with direct contact information for care management team . Advised patient, providing education and rationale, to monitor blood pressure daily and record, calling PCP for findings outside established parameters.  . Reviewed scheduled/upcoming provider appointments    Patient Self Care Activities:  . Self administers medications as prescribed . Attends all scheduled provider appointments . Calls provider office for new concerns,  questions, or BP outside discussed parameters . Checks BP and records as discussed  Instructed patient to call her provider today to notify of blood pressure readings.  Patient verbalizes understanding of instructions provided today.   The Managed Medicaid care management team will reach out to the patient again over the next 7 days.  The patient has been provided with contact information for the Managed Medicaid care management team and has been advised to call with any health related questions or concerns.   Aida Raider RN, BSN [place]  [facility] - Managed Medicaid High Risk [phone].

## 2020-08-28 NOTE — Patient Outreach (Signed)
Medicaid Managed Care   Nurse Care Manager Note  08/28/2020 Name:  LONI DELBRIDGE MRN:  [id number] DOB:  [birth year]  Grace [name] is an 20 y.o. year old female who is a primary patient of Steele Sizer, MD.  The Medicaid Managed Care Coordination team was consulted for assistance with:    Obstetrics healthcare management needs  Ms. Augustine was given information about Medicaid Managed Care Coordination team services today. Grace [name] agreed to services and verbal consent obtained.  Engaged with patient by telephone for follow up visit in response to provider referral for case management and/or care coordination services.   Assessments/Interventions:  Review of past medical history, allergies, medications, health status, including review of consultants reports, laboratory and other test data, was performed as part of comprehensive evaluation and provision of chronic care management services.  SDOH (Social Determinants of Health) assessments and interventions performed:   Care Plan  No Known Allergies  Medications Reviewed Today    Reviewed by Gayla Medicus, RN (Registered Nurse) on 08/28/20 at [phone]  Med List Status: <None>  Medication Order Taking? Sig Documenting Provider Last Dose Status Informant  acetaminophen (TYLENOL) 325 MG tablet [id number] Yes Take 2 tablets (650 mg total) by mouth every 4 (four) hours as needed (for pain scale < 4). Minda Meo, CNM Taking Active   albuterol (VENTOLIN HFA) 108 (90 Base) MCG/ACT inhaler [id number] Yes Inhale 2 puffs into the lungs every 6 (six) hours as needed for wheezing or shortness of breath. Marlana Salvage, PA Taking Active   ARIPiprazole (ABILIFY) 5 MG tablet [id number] Yes Take 5 mg by mouth daily. [provider] Taking Active Self  Blood Pressure KIT [id number] Yes 1 kit by Does not apply route daily. Notify provider for blood pressures less than 90/50 or 160/110 or greater Minda Meo, CNM Taking Active   cetirizine  (ZYRTEC) 10 MG tablet [id number] Yes Take 1 tablet (10 mg total) by mouth daily. Steele Sizer, MD Taking Active   cyclobenzaprine (FLEXERIL) 10 MG tablet [id number] Yes Take 1 tablet (10 mg total) by mouth every 8 (eight) hours as needed for muscle spasms. Lisette Grinder, CNM Taking Active   ibuprofen (ADVIL) 600 MG tablet [id number] Yes Take 1 tablet (600 mg total) by mouth every 6 (six) hours as needed for mild pain or moderate pain. Minda Meo, CNM Taking Active   meclizine (ANTIVERT) 25 MG tablet [id number] No   Patient not taking: Reported on 08/28/2020   [provider] Not Taking Active   montelukast (SINGULAIR) 10 MG tablet [id number] Yes Take 1 tablet (10 mg total) by mouth at bedtime. Steele Sizer, MD Taking Active   NIFEdipine (ADALAT CC) 60 MG 24 hr tablet [id number] Yes Take 1 tablet (60 mg total) by mouth daily. Minda Meo, CNM Taking Active   polyethylene glycol powder (GLYCOLAX/MIRALAX) 17 GM/SCOOP powder [id number] Yes Take 17 g by mouth daily.  Patient taking differently: Take 17 g by mouth daily. As needed.   Steele Sizer, MD Taking Active   Prenatal Vit-Fe Fumarate-FA (PRENATAL MULTIVITAMIN) TABS tablet [id number] Yes Take 1 tablet by mouth daily at 12 noon. Steele Sizer, MD Taking Active   sertraline (ZOLOFT) 100 MG tablet [id number] Yes Take 100 mg by mouth daily. [provider] Taking Active Self  Spacer/Aero-Holding Donavan Burnet Kezia-[employer]) [place] [id number] Yes See admin instructions. use with inhaler [provider] Taking Active Self  STIMULANT LAXATIVE 8.6-50 MG tablet [id number] Yes Take 2 tablets by  mouth at bedtime as needed. [provider] Taking Active   Med List Note [name], [facility] 11/04/19 1694): Pt has been out of most meds for a few weeks          Patient Active Problem List   Diagnosis Date Noted  . Elevated blood pressure affecting pregnancy in third trimester, antepartum 07/25/2020   . Labor and delivery indication for care or intervention 07/25/2020  . Supervision of high risk pregnancy in third trimester 01/12/2020  . Mood disorder ([place]) 11/15/2018  . PTSD (post-traumatic stress disorder) 11/15/2018  . Class 1 obesity due to excess calories without serious comorbidity with body mass index (BMI) of 30.0 to 30.9 in adult 11/15/2018  . Neutropenia ([place]) 08/06/2018  . Major depression, recurrent, chronic ([place]) 08/06/2018  . GAD (generalized anxiety disorder) 08/06/2018  . Panic attack 01/22/2016  . Chronic allergic rhinitis 12/20/2015  . Asthma, moderate persistent, poorly-controlled 12/20/2015    Conditions to be addressed/monitored per PCP order:  HTN, Anxiety and Depression, asthma  Patient Care Plan: General Plan of Care (Adult)    Problem Identified: Health Promotion or Disease Self-Management (General Plan of Care)     Goal: Self-Management Plan Developed   Start Date: 07/23/2020  Expected End Date: 10/22/2020  This Visit's Progress: Not on track  Priority: High  Note:   Current Barriers:  . Patient currently 37.[redacted] weeks gestation and experiencing pelvic pain. . Patient unable to attend medical appointment last week due to transportation issues.  Nurse Case Manager Clinical Goal(s):  Marland Kitchen Over the next 30 days, patient will verbalize understanding of plan for transportation needs. . Over the next 30 days, patient will work with medical provider  to address needs related to ongoing pelvic pain . Update 08/28/20:  Patient is getting PT referral from her provider for outpatient services. . Over the next 30 days, patient will attend all scheduled medical appointments:  Interventions:  . Inter-disciplinary care team collaboration (see longitudinal plan of care) . Evaluation of current treatment plan related to pregnancy and patient's adherence to plan as established by provider. . Advised patient to contact medical provider's office with increasing pain for  evaluation. . Reviewed medications with patient. . Discussed plans with patient for ongoing care management follow up and provided patient with direct contact information for care management team . Reviewed scheduled/upcoming provider appointments. . Care Guide referral for transportation needs.  Patient Goals/Self-Care Activities Over the next 30 days, patient will:  -Patient will attend all scheduled appointments. Patient verbalizes understanding of plan for transportation. Attends all scheduled provider appointments Follow Up Plan: The patient will call call medical provider's office as advised to for any increasing pelvic pain/pressure. Care Guide will contact patient regarding transportation issues. The Managed Medicaid care management team will reach out to the patient again over the next 7-`14 days.  The patient has been provided with contact information for the Managed Medicaid care management team and has been advised to call with any health related questions or concerns.  OB Visit appointment: is scheduled for Wednesday, Dec 1. Update 08/28/20: SVD 07/26/20       Problem Identified: Coping Skills (General Plan of Care)   Priority: High  Onset Date: 07/23/2020  Note:   Current Barriers:  . Care Coordination needs related to anxiety and depression . Transportation barriers   Nurse Case Manager Clinical Goal(s):  Marland Kitchen Over the next 30 days, patient will work with CM clinical social worker to address needs related to anxiety and  depression.  Interventions:  . Inter-disciplinary care team collaboration (see longitudinal plan of care) . Collaborated with LCSW regarding patient. . Discussed plans with patient for ongoing care management follow up and provided patient with direct contact information for care management team . Social Work referral for anxiety/depression.  Patient Goals/Self-Care Activities Over the next 30 days, patient will:  -Patient will speak with LCSW  concerning anxiety and depression.  Follow Up Plan: Patient will follow up with LCSW regarding counseling needs.. Licensed Clinical Social Worker will schedule appointment for patient for evaluation of current needs. The Managed Medicaid care management team will reach out to the patient again over the next 7-14 days.  The patient has been provided with contact information for the Managed Medicaid care management team and has been advised to call with any health related questions or concerns.       Long-Range Goal: Coping Skills Enhanced   Start Date: 07/23/2020  Expected End Date: 10/22/2020  This Visit's Progress: On track  Recent Progress: Not on track  Priority: High  Note:   Patient will follow up with LCSW regarding counseling needs.. Licensed Clinical Social Worker will schedule appointment for patient for evaluation of current needs. The Managed Medicaid care management team will reach out to the patient again over the next 7-14 days.  The patient has been provided with contact information for the Managed Medicaid care management team and has been advised to call with any health related questions or concerns.    Patient Care Plan: General Social Work (Adult)    Problem Identified: Symptoms (Depression)     Goal: Symptoms Monitored and Managed   Start Date: 07/25/2020  Expected End Date: 10/23/2020  Recent Progress: On track  Priority: High  Note:   Current Barriers:  . Chronic Mental Health needs related to depression and anxiety and past trauma experiences. . Financial constraints related to unemployment. . Transportation . Substance abuse issues -  Patient reports she smoked marijuana until she was about 4 months pregnant. She also smoked cigarettes until about 3 or 4 weeks ago. She now vapes. Leodis [place] knowledge of community resource: Patient was receiving outpatient [facility], but her services were terminated because they do not accept her Medicaid plan.   . Suicidal Ideation/Homicidal Ideation: No  Clinical Social Work Goal(s):  Marland Kitchen Over the next 90 days, patient will work with SW monthly by telephone or in person to reduce or manage symptoms related to depression, anxiety and past trauma. . Over the next 60 days, patient will work with SW to address concerns related to depression and anxiety . Over the next 60 days, patient will work with BSW to address needs related to scheduling appointment with appropriate outpatient providers for follow up.   Interventions: . Patient interviewed and appropriate assessments performed: PHQ 2 and PHQ 9 . Patient interviewed and appropriate assessments performed . Provided mental health counseling with regard to depression and being a young mother . Provided patient with information about caring for self and utilizing support system after childbirth. . Discussed plans with patient for ongoing care management follow up and provided patient with direct contact information for care management team . Collaborated with RN Case Manager re: outpatient therapy and medication management needs . Collaborated with BSW re: scheduling appointment with [facility] [phone]. . Brief CBT, Emotional/Supportive Counseling, Psychoeducation and/or Health Education, and Referred to counselor/psychotherapist . BSW contacted [facility] to make a new patient appointment. BSW  was informed that a referral has to be submitted. BSW will send referral. Referral sent on 08/06/20.  Patient Self Care Activities:  . Performs ADL's independently . Performs IADL's independently . Ability for insight . Motivation for treatment . Strong family or social support  Patient Self Care Deficits:  . Does not attend all scheduled provider appointments . Former marijuana use           Patient Care Plan: Hypertension (Adult)    Problem Identified: Hypertension (Hypertension)      Long-Range Goal: Hypertension Monitored   Start Date: 08/28/2020  Expected End Date: 11/26/2020  This Visit's Progress: Not on track  Priority: High  Note:   Evidence-based guidance:   Promote initial use of ambulatory blood pressure measurements (for 3 days) to rule out "white-coat" effect; identify masked hypertension and presence or absence of nocturnal "dipping" of blood pressure.    Encourage continued use of home blood pressure monitoring and recording in blood pressure log; include symptoms of hypotension or potential medication side effects in log.   Review blood pressure measurements taken inside and outside of the provider office; establish baseline and monitor trends; compare to target ranges or patient goal.   Share overall cardiovascular risk with patient; encourage changes to lifestyle risk factors, including alcohol consumption, smoking, inadequate exercise, poor dietary habits and stress.      Task: Identify and Monitor Blood Pressure Elevation Completed 08/28/2020  Note:   Care Management Activities:    - blood pressure trends reviewed - home or ambulatory blood pressure monitoring encouraged    Notes:    Power (see longtitudinal plan of care for additional care plan information)  Objective:  . Last practice recorded BP readings:  BP Readings from Last 3 Encounters:  07/28/20 139/85  07/01/20 129/83  05/26/20 (!) 119/56  .  Marland Kitchen Most recent eGFR/CrCl: No results found for: EGFR  No components found for: CRCL  Current Barriers:  Marland Kitchen Knowledge Deficits related to basic understanding of hypertension pathophysiology and self care management  Case Manager Clinical Goal(s):  Marland Kitchen Over the next 30 days, patient will verbalize understanding of plan for hypertension management . Over the next 30 days, patient will attend all scheduled medical appointments:  Interventions:  . Evaluation of current treatment plan related to hypertension self  management and patient's adherence to plan as established by provider. . Discussed plans with patient for ongoing care management follow up and provided patient with direct contact information for care management team . Advised patient, providing education and rationale, to monitor blood pressure daily and record, calling PCP for findings outside established parameters.  . Reviewed scheduled/upcoming provider appointments    Patient Self Care Activities:  . Self administers medications as prescribed . Attends all scheduled provider appointments . Calls provider office for new concerns, questions, or BP outside discussed parameters . Checks BP and records as discussed  Instructed patient to call her provider today to notify of blood pressure readings.  Follow Up:  Patient agrees to Care Plan and Follow-up.  Plan: The Managed Medicaid care management team will reach out to the patient again over the next 7 days. and The patient has been provided with contact information for the Managed Medicaid care management team and has been advised to call with any health related questions or concerns.  Date/time of next scheduled RN care management/care coordination outreach:    09/03/20 at 330pm

## 2020-08-28 NOTE — Patient Instructions (Signed)
Visit Information  Ms. [name]  - as a part of your Medicaid benefit, you are eligible for care management and care coordination services at no cost or copay. I was unable to reach you by phone today but would be happy to help you with your health related needs. Please feel free to call me @ [phone].   A member of the Managed Medicaid care management team will reach out to you again over the next 7 days.  Gus Puma, BSW, [facility]  [place]  High Risk Managed Medicaid Team

## 2020-08-28 NOTE — Patient Outreach (Signed)
Care Coordination  08/28/2020  Grace [name] [birth year] [id number]   Medicaid Managed Care   Unsuccessful Outreach Note  08/28/2020 Name: Grace [name] MRN: [id number] DOB: Apr 13, 2001  Referred by: Alba Cory, MD Reason for referral : High Risk Managed Medicaid (HR MM Unsuccessful Telephone Outreach)   An unsuccessful telephone outreach was attempted today. The patient was referred to the case management team for assistance with care management and care coordination.   Follow Up Plan: BSW will contact patient in 7 days.  Gus Puma, BSW, [facility]  [place]  High Risk Managed Medicaid Team

## 2020-08-29 ENCOUNTER — Other Ambulatory Visit: Payer: Self-pay

## 2020-08-29 ENCOUNTER — Ambulatory Visit: Payer: Self-pay

## 2020-08-29 NOTE — Patient Outreach (Signed)
Medicaid Managed Care Social Work Note  08/29/2020 Name:  Grace [name] MRN:  [id number] DOB:  2001/05/11  Grace [name] is an 20 y.o. year old female who is a primary patient of Steele Sizer, MD.  The Medicaid Managed Care Coordination team was consulted for assistance with:  [place] and Resources  Ms. Derks was given information about Medicaid Managed CareCoordination services today. Grace [name] agreed to services and verbal consent obtained.  Engaged with patient  for by telephone forfollow up visit in response to referral for case management and/or care coordination services.   Assessments/Interventions:  Review of past medical history, allergies, medications, health status, including review of consultants reports, laboratory and other test data, was performed as part of comprehensive evaluation and provision of chronic care management services.  SDOH: (Social Determinant of Health) assessments and interventions performed: SDOH Interventions   Flowsheet Row Most Recent Value  SDOH Interventions   Food Insecurity Interventions Other (Comment)  [Patient stated she was denied SNAP benefits. She receives [facility] for the baby.]  Financial Strain Interventions Intervention Not Indicated  Housing Interventions Intervention Not Indicated  Intimate Partner Violence Interventions Intervention Not Indicated  Stress Interventions Provide Counseling  Transportation Interventions Intervention Not Indicated  [Patient has worked on her transportation issues and now experiences none.]  Alcohol Brief Interventions/Follow-up AUDIT Score <7 follow-up not indicated  Depression Interventions/Treatment  Referral to Psychiatry, Currently on Treatment, Counseling  [Patient referred for therapy and medication management.]      Advanced Directives Status:  Not addressed in this encounter.  Care Plan                 No Known Allergies  Medications Reviewed Today    Reviewed by  Netta Neat, LCSW (Social Worker) on 08/29/20 at 1324  Med List Status: <None>  Medication Order Taking? Sig Documenting Provider Last Dose Status Informant  acetaminophen (TYLENOL) 325 MG tablet [id number]  Take 2 tablets (650 mg total) by mouth every 4 (four) hours as needed (for pain scale < 4). Minda Meo, CNM  Active   albuterol (VENTOLIN HFA) 108 (90 Base) MCG/ACT inhaler [id number]  Inhale 2 puffs into the lungs every 6 (six) hours as needed for wheezing or shortness of breath. Marlana Salvage, PA  Active   ARIPiprazole (ABILIFY) 5 MG tablet [id number] Yes Take 5 mg by mouth daily. [provider] Taking Active Self  Blood Pressure KIT [id number]  1 kit by Does not apply route daily. Notify provider for blood pressures less than 90/50 or 160/110 or greater Minda Meo, CNM  Active   cetirizine (ZYRTEC) 10 MG tablet [id number]  Take 1 tablet (10 mg total) by mouth daily. Steele Sizer, MD  Active   cyclobenzaprine (FLEXERIL) 10 MG tablet [id number]  Take 1 tablet (10 mg total) by mouth every 8 (eight) hours as needed for muscle spasms. Lisette Grinder, CNM  Active   ibuprofen (ADVIL) 600 MG tablet [id number]  Take 1 tablet (600 mg total) by mouth every 6 (six) hours as needed for mild pain or moderate pain. Minda Meo, CNM  Active   meclizine (ANTIVERT) 25 MG tablet [id number]    Patient not taking: Reported on 08/28/2020   [provider]  Active   montelukast (SINGULAIR) 10 MG tablet [id number]  Take 1 tablet (10 mg total) by mouth at bedtime. Steele Sizer, MD  Active   NIFEdipine (ADALAT CC) 60 MG 24 hr tablet [id number]  Take 1 tablet (60  mg total) by mouth daily. Minda Meo, CNM  Active   polyethylene glycol powder (GLYCOLAX/MIRALAX) 17 GM/SCOOP powder [id number]  Take 17 g by mouth daily.  Patient taking differently: Take 17 g by mouth daily. As needed.   Steele Sizer, MD  Active   Prenatal Vit-Fe Fumarate-FA (PRENATAL MULTIVITAMIN) TABS tablet  [id number]  Take 1 tablet by mouth daily at 12 noon. Steele Sizer, MD  Active   sertraline (ZOLOFT) 100 MG tablet [id number] Yes Take 100 mg by mouth daily. [provider] Taking Active Self  Spacer/Aero-Holding Donavan Burnet [employer] [place]) [place] [id number]  See admin instructions. use with inhaler [provider]  Active Self  STIMULANT LAXATIVE 8.6-50 MG tablet [id number]  Take 2 tablets by mouth at bedtime as needed. [provider]  Active   Med List Note [name], [facility] 11/04/19 [id number]): Pt has been out of most meds for a few weeks          Patient Active Problem List   Diagnosis Date Noted  . Elevated blood pressure affecting pregnancy in third trimester, antepartum 07/25/2020  . Labor and delivery indication for care or intervention 07/25/2020  . Supervision of high risk pregnancy in third trimester 01/12/2020  . Mood disorder ([place]) 11/15/2018  . PTSD (post-traumatic stress disorder) 11/15/2018  . Class 1 obesity due to excess calories without serious comorbidity with body mass index (BMI) of 30.0 to 30.9 in adult 11/15/2018  . Neutropenia ([place]) 08/06/2018  . Major depression, recurrent, chronic ([place]) 08/06/2018  . GAD (generalized anxiety disorder) 08/06/2018  . Panic attack 01/22/2016  . Chronic allergic rhinitis 12/20/2015  . Asthma, moderate persistent, poorly-controlled 12/20/2015    Conditions to be addressed/monitored per PCP order:  Depression  Patient Care Plan: General Plan of Care (Adult)    Problem Identified: Health Promotion or Disease Self-Management (General Plan of Care)     Goal: Self-Management Plan Developed   Start Date: 07/23/2020  Expected End Date: 10/22/2020  This Visit's Progress: Not on track  Priority: High  Note:   Current Barriers:  . Patient currently 37.[redacted] weeks gestation and experiencing pelvic pain. . Patient unable to attend medical appointment last week due to transportation issues.  Nurse  Case Manager Clinical Goal(s):  Marland Kitchen Over the next 30 days, patient will verbalize understanding of plan for transportation needs. . Over the next 30 days, patient will work with medical provider  to address needs related to ongoing pelvic pain . Over the next 30 days, patient will attend all scheduled medical appointments:  Interventions:  . Inter-disciplinary care team collaboration (see longitudinal plan of care) . Evaluation of current treatment plan related to pregnancy and patient's adherence to plan as established by provider. . Advised patient to contact medical provider's office with increasing pain for evaluation. . Reviewed medications with patient. . Discussed plans with patient for ongoing care management follow up and provided patient with direct contact information for care management team . Reviewed scheduled/upcoming provider appointments. . Care Guide referral for transportation needs.  Patient Goals/Self-Care Activities Over the next 30 days, patient will:  -Patient will attend all scheduled appointments. Patient verbalizes understanding of plan for transportation. Attends all scheduled provider appointments Follow Up Plan: The patient will call call medical provider's office as advised to for any increasing pelvic pain/pressure. Care Guide will contact patient regarding transportation issues. The Managed Medicaid care management team will reach out to the patient again over the next 7-`14 days.  The patient has been provided  with contact information for the Managed Medicaid care management team and has been advised to call with any health related questions or concerns.  OB Visit appointment: is scheduled for Wednesday, Dec 1.       Problem Identified: Coping Skills (General Plan of Care)   Priority: High  Onset Date: 07/23/2020  Note:   Current Barriers:  . Care Coordination needs related to anxiety and depression . Transportation barriers   Nurse Case Manager  Clinical Goal(s):  Marland Kitchen Over the next 30 days, patient will work with CM clinical social worker to address needs related to anxiety and depression.  Interventions:  . Inter-disciplinary care team collaboration (see longitudinal plan of care) . Collaborated with LCSW regarding patient. . Discussed plans with patient for ongoing care management follow up and provided patient with direct contact information for care management team . Social Work referral for anxiety/depression.  Patient Goals/Self-Care Activities Over the next 30 days, patient will:  -Patient will speak with LCSW concerning anxiety and depression.  Follow Up Plan: Patient will follow up with LCSW regarding counseling needs.. Licensed Clinical Social Worker will schedule appointment for patient for evaluation of current needs. The Managed Medicaid care management team will reach out to the patient again over the next 7-14 days.  The patient has been provided with contact information for the Managed Medicaid care management team and has been advised to call with any health related questions or concerns.       Long-Range Goal: Coping Skills Enhanced   Start Date: 07/23/2020  Expected End Date: 10/22/2020  This Visit's Progress: On track  Recent Progress: Not on track  Priority: High  Note:   Patient will follow up with LCSW regarding counseling needs.. Licensed Clinical Social Worker will schedule appointment for patient for evaluation of current needs. The Managed Medicaid care management team will reach out to the patient again over the next 7-14 days.  The patient has been provided with contact information for the Managed Medicaid care management team and has been advised to call with any health related questions or concerns.    Patient Care Plan: General Social Work (Adult)    Problem Identified: Symptoms (Depression)     Long-Range Goal: Symptoms Monitored and Managed   Start Date: 07/25/2020  Expected End Date:  10/23/2020  This Visit's Progress: On track  Recent Progress: On track  Priority: High  Note:   Current Barriers:  . Chronic Mental Health needs related to depression and anxiety and past trauma experiences. . Financial constraints related to unemployment. . Transportation . Substance abuse issues -  Patient reports she smoked marijuana until she was about 4 months pregnant. She also smoked cigarettes until about 3 or 4 weeks ago. She now vapes. Leodis [place] knowledge of community resource: Patient was receiving outpatient therapy at [facility], but her services were terminated because they do not accept her Medicaid plan.  . Suicidal Ideation/Homicidal Ideation: No  Clinical Social Work Goal(s):  Marland Kitchen Over the next 90 days, patient will work with SW monthly by telephone or in person to reduce or manage symptoms related to depression, anxiety and past trauma. . Over the next 60 days, patient will work with SW to address concerns related to depression and anxiety . Over the next 60 days, patient will work with BSW to address needs related to scheduling appointment with appropriate outpatient providers for follow up.   Interventions: . Patient interviewed and appropriate assessments performed: PHQ 2 and PHQ 9 . Patient  interviewed and appropriate assessments performed . Provided mental health counseling with regard to depression and being a young mother. Update 08/29/2020: Patient reported that "everything is stressing her." She denied SI/HI/AVH. LCSW counseled patient about being a new parent and pacing herself with tasks she has set for herself, and cautioned against feeling like a failure if she doesn't do everything in the exact order she would like. LCSW discussed coping skills and interventions for her to utilize whenever she feels stressed. . Provided patient with information about caring for self and utilizing support system after childbirth. . Discussed plans with patient for ongoing care  management follow up and provided patient with direct contact information for care management team . Collaborated with RN Case Manager re: outpatient therapy and medication management needs . Collaborated with BSW re: scheduling appointment with [facility] [phone]. . Brief CBT, Emotional/Supportive Counseling, Psychoeducation and/or Health Education, and Referred to counselor/psychotherapist . BSW contacted [facility] to make a new patient appointment. BSW was informed that a referral has to be submitted. BSW will send referral. Referral sent on 08/06/20. Marland Kitchen Update 08/29/2020: Patient stated she has not received a call from [facility] to schedule appointments. LCSW will collaborate with BSW regarding scheduling. LCSW will consider referring to another agency if unable to get patient scheduled [facility] [phone] or [phone]).  Patient Self Care Activities:  . Performs ADL's independently . Performs IADL's independently . Ability for insight . Motivation for treatment . Strong family or social support  Patient Self Care Deficits:  . Does not attend all scheduled provider appointments . Former marijuana use . Update 08/29/2020: Patient is a young, new mother. She was recently diagnosed with Covid after giving birth; she had mild symptoms and she had been vaccinated. Her newborn son was also diagnosed with Covid and was recently discharged on 08/28/2020. She has started back attending college as well, and she plans to go back to work (she has an interview today).      Patient Care Plan: Hypertension (Adult)    Problem Identified: Hypertension (Hypertension)     Long-Range Goal: Hypertension Monitored   Start Date: 08/28/2020  Expected End Date: 11/26/2020  This Visit's Progress: Not on track  Priority: High  Note:   Evidence-based guidance:   Promote initial use of ambulatory blood  pressure measurements (for 3 days) to rule out "white-coat" effect; identify masked hypertension and presence or absence of nocturnal "dipping" of blood pressure.    Encourage continued use of home blood pressure monitoring and recording in blood pressure log; include symptoms of hypotension or potential medication side effects in log.   Review blood pressure measurements taken inside and outside of the provider office; establish baseline and monitor trends; compare to target ranges or patient goal.   Share overall cardiovascular risk with patient; encourage changes to lifestyle risk factors, including alcohol consumption, smoking, inadequate exercise, poor dietary habits and stress.      Task: Identify and Monitor Blood Pressure Elevation Completed 08/28/2020  Note:   Care Management Activities:     Notes:    Goals Addressed            This Visit's Progress   . Attend Therapy to Help Me Manage My Emotions   On track    Patient will:   - Attend therapy and medication management appointments as scheduled.  - Take medications as prescribed.  - Actively participate in sessions.  - Report changes to providers,  such as if she feels the medication is not working, or she needs to discuss something with the therapist.       Follow up:  Patient agrees to Care Plan and Follow-up.  Plan: The Managed Medicaid care management team will reach out to the patient again over the next 7 days.  Date/time of next scheduled Social Work care management/care coordination outreach:  09/03/2020 @ 3:30pm.  Netta Neat, BSW, MSW, [place]: [phone]

## 2020-08-29 NOTE — Patient Instructions (Addendum)
Visit Information Grace [name], it was a pleasure speaking with you today. Please remember that a member of the [facility] Managed Care Team will contact you to assist with scheduling you for outpatient therapy and medication management.   Grace [name] was given information about Medicaid Managed Care team care coordination services as a part of their [facility] Medicaid benefit. Grace [name] verbally consented to engagement with the [facility] Managed Care team.   For questions related to your [facility] health plan, please call: [phone]  If you would like to schedule transportation through your [facility] plan, please call the following number at least 2 days in advance of your appointment: [phone]  Goals Addressed            This Visit's Progress   . Attend Therapy to Help Me Manage My Emotions   On track    Patient will:   - Attend therapy and medication management appointments as scheduled.  - Take medications as prescribed.  - Actively participate in sessions.  - Report changes to providers, such as if she feels the medication is not working, or she needs to discuss something with the therapist.      Patient Care Plan: General Plan of Care (Adult)    Problem Identified: Health Promotion or Disease Self-Management (General Plan of Care)     Goal: Self-Management Plan Developed   Start Date: 07/23/2020  Expected End Date: 10/22/2020  This Visit's Progress: Not on track  Priority: High  Note:   Current Barriers:  . Patient currently 37.[redacted] weeks gestation and experiencing pelvic pain. . Patient unable to attend medical appointment last week due to transportation issues.  Nurse Case Manager Clinical Goal(s):  Marland Kitchen Over the next 30 days, patient will verbalize understanding of plan for transportation needs. . Over the next 30 days, patient will work with medical provider  to address needs related to ongoing pelvic pain . Over the next 30 days, patient will attend all  scheduled medical appointments:  Interventions:  . Inter-disciplinary care team collaboration (see longitudinal plan of care) . Evaluation of current treatment plan related to pregnancy and patient's adherence to plan as established by provider. . Advised patient to contact medical provider's office with increasing pain for evaluation. . Reviewed medications with patient. . Discussed plans with patient for ongoing care management follow up and provided patient with direct contact information for care management team . Reviewed scheduled/upcoming provider appointments. . Care Guide referral for transportation needs.  Patient Goals/Self-Care Activities Over the next 30 days, patient will:  -Patient will attend all scheduled appointments. Patient verbalizes understanding of plan for transportation. Attends all scheduled provider appointments Follow Up Plan: The patient will call call medical provider's office as advised to for any increasing pelvic pain/pressure. Care Guide will contact patient regarding transportation issues. The Managed Medicaid care management team will reach out to the patient again over the next 7-`14 days.  The patient has been provided with contact information for the Managed Medicaid care management team and has been advised to call with any health related questions or concerns.  OB Visit appointment: is scheduled for Wednesday, Dec 1.       Problem Identified: Coping Skills (General Plan of Care)   Priority: High  Onset Date: 07/23/2020  Note:   Current Barriers:  . Care Coordination needs related to anxiety and depression . Transportation barriers   Nurse Case Manager Clinical Goal(s):  Marland Kitchen Over the next 30 days, patient will work with CM clinical  social worker to address needs related to anxiety and depression.  Interventions:  . Inter-disciplinary care team collaboration (see longitudinal plan of care) . Collaborated with LCSW regarding  patient. . Discussed plans with patient for ongoing care management follow up and provided patient with direct contact information for care management team . Social Work referral for anxiety/depression.  Patient Goals/Self-Care Activities Over the next 30 days, patient will:  -Patient will speak with LCSW concerning anxiety and depression.  Follow Up Plan: Patient will follow up with LCSW regarding counseling needs.. Licensed Clinical Social Worker will schedule appointment for patient for evaluation of current needs. The Managed Medicaid care management team will reach out to the patient again over the next 7-14 days.  The patient has been provided with contact information for the Managed Medicaid care management team and has been advised to call with any health related questions or concerns.       Long-Range Goal: Coping Skills Enhanced   Start Date: 07/23/2020  Expected End Date: 10/22/2020  This Visit's Progress: On track  Recent Progress: Not on track  Priority: High  Note:   Patient will follow up with LCSW regarding counseling needs.. Licensed Clinical Social Worker will schedule appointment for patient for evaluation of current needs. The Managed Medicaid care management team will reach out to the patient again over the next 7-14 days.  The patient has been provided with contact information for the Managed Medicaid care management team and has been advised to call with any health related questions or concerns.    Patient Care Plan: General Social Work (Adult)    Problem Identified: Symptoms (Depression)     Long-Range Goal: Symptoms Monitored and Managed   Start Date: 07/25/2020  Expected End Date: 10/23/2020  This Visit's Progress: On track  Recent Progress: On track  Priority: High  Note:   Current Barriers:  . Chronic Mental Health needs related to depression and anxiety and past trauma experiences. . Financial constraints related to  unemployment. . Transportation . Substance abuse issues -  Patient reports she smoked marijuana until she was about 4 months pregnant. She also smoked cigarettes until about 3 or 4 weeks ago. She now vapes. Grace [name] knowledge of community resource: Patient was receiving outpatient therapy at [facility], but her services were terminated because they do not accept her Medicaid plan.  . Suicidal Ideation/Homicidal Ideation: No  Clinical Social Work Goal(s):  Marland Kitchen Over the next 90 days, patient will work with SW monthly by telephone or in person to reduce or manage symptoms related to depression, anxiety and past trauma. . Over the next 60 days, patient will work with SW to address concerns related to depression and anxiety . Over the next 60 days, patient will work with BSW to address needs related to scheduling appointment with appropriate outpatient providers for follow up.   Interventions: . Patient interviewed and appropriate assessments performed: PHQ 2 and PHQ 9 . Patient interviewed and appropriate assessments performed . Provided mental health counseling with regard to depression and being a young mother. Update 08/29/2020: Patient reported that "everything is stressing her." She denied SI/HI/AVH. LCSW counseled patient about being a new parent and pacing herself with tasks she has set for herself, and cautioned against feeling like a failure if she doesn't do everything in the exact order she would like. LCSW discussed coping skills and interventions for her to utilize whenever she feels stressed. . Provided patient with information about caring for self and utilizing support  system after childbirth. . Discussed plans with patient for ongoing care management follow up and provided patient with direct contact information for care management team . Collaborated with RN Case Manager re: outpatient therapy and medication management needs . Collaborated with BSW re: scheduling appointment with  [facility] [phone]. . Brief CBT, Emotional/Supportive Counseling, Psychoeducation and/or Health Education, and Referred to counselor/psychotherapist . BSW contacted [facility] to make a new patient appointment. BSW was informed that a referral has to be submitted. BSW will send referral. Referral sent on 08/06/20. Marland Kitchen Update 08/29/2020: Patient stated she has not received a call from [facility] to schedule appointments. LCSW will collaborate with BSW regarding scheduling. LCSW will consider referring to another agency if unable to get patient scheduled [facility] [phone] or [phone]).  Patient Self Care Activities:  . Performs ADL's independently . Performs IADL's independently . Ability for insight . Motivation for treatment . Strong family or social support  Patient Self Care Deficits:  . Does not attend all scheduled provider appointments . Former marijuana use . Update 08/29/2020: Patient is a young, new mother. She was recently diagnosed with Covid after giving birth; she had mild symptoms and she had been vaccinated. Her newborn son was also diagnosed with Covid and was recently discharged on 08/28/2020. She has started back attending college as well, and she plans to go back to work (she has an interview today).      Patient Care Plan: Hypertension (Adult)    Problem Identified: Hypertension (Hypertension)     Long-Range Goal: Hypertension Monitored   Start Date: 08/28/2020  Expected End Date: 11/26/2020  This Visit's Progress: Not on track  Priority: High  Note:   Evidence-based guidance:   Promote initial use of ambulatory blood pressure measurements (for 3 days) to rule out "white-coat" effect; identify masked hypertension and presence or absence of nocturnal "dipping" of blood pressure.    Encourage continued use of home blood pressure monitoring and recording in blood  pressure log; include symptoms of hypotension or potential medication side effects in log.   Review blood pressure measurements taken inside and outside of the provider office; establish baseline and monitor trends; compare to target ranges or patient goal.   Share overall cardiovascular risk with patient; encourage changes to lifestyle risk factors, including alcohol consumption, smoking, inadequate exercise, poor dietary habits and stress.      Task: Identify and Monitor Blood Pressure Elevation Completed 08/28/2020  Note:   Care Management Activities:       Notes:     Please see education materials related to post partum depression provided by e-mail link.  Patient verbalizes understanding of instructions provided today.   The Managed Medicaid care management team will reach out to the patient again over the next 7 days.  The patient has been provided with contact information for the Managed Medicaid care management team and has been advised to call with any health related questions or concerns.   Grace Neat, LCSW Grace [name], BSW, MSW, LCSW Social Work Case Freight forwarder - [place]  Direct [place]: [phone]

## 2020-08-30 ENCOUNTER — Ambulatory Visit: Payer: Self-pay

## 2020-08-31 ENCOUNTER — Other Ambulatory Visit: Payer: Self-pay

## 2020-08-31 NOTE — Patient Outreach (Signed)
Medicaid Managed Care Social Work Note  08/31/2020 Name:  Grace [name] MRN:  [id number] DOB:  12/03/2000  Grace [name] is an 19 y.o. year old female who is a primary patient of Sowles, Krichna, MD.  The Medicaid Managed Care Coordination team was consulted for assistance with:  Mental Health Counseling and Resources  Grace [name] was given information about Medicaid Managed CareCoordination services today. Grace [name] agreed to services and verbal consent obtained.  Engaged with patient  for by telephone forfollow up visit in response to referral for case management and/or care coordination services.   Assessments/Interventions:  Review of past medical history, allergies, medications, health status, including review of consultants reports, laboratory and other test data, was performed as part of comprehensive evaluation and provision of chronic care management services.  SDOH: (Social Determinant of Health) assessments and interventions performed: BSW contacted patient to inform her of her therapy appointment on 09/13/20 at 2pm at [facility]. They are located at [address], [place] and can be reached at [phone]   Advanced Directives Status:  Not addressed in this encounter.  Care Plan                 No Known Allergies  Medications Reviewed Today    Reviewed by Patterson, Regina, LCSW (Social Worker) on 08/29/20 at 1324  Med List Status: <None>  Medication Order Taking? Sig Documenting Provider Last Dose Status Informant  acetaminophen (TYLENOL) 325 MG tablet [id number]  Take 2 tablets (650 mg total) by mouth every 4 (four) hours as needed (for pain scale < 4). Mackie, Anna M, CNM  Active   albuterol (VENTOLIN HFA) 108 (90 Base) MCG/ACT inhaler [id number]  Inhale 2 puffs into the lungs every 6 (six) hours as needed for wheezing or shortness of breath. Rodgers, Caitlin J, PA  Active   ARIPiprazole (ABILIFY) 5 MG tablet [id number] Yes Take 5 mg by  mouth daily. [provider] Taking Active Self  Blood Pressure KIT [id number]  1 kit by Does not apply route daily. Notify provider for blood pressures less than 90/50 or 160/110 or greater Mackie, Anna M, CNM  Active   cetirizine (ZYRTEC) 10 MG tablet [id number]  Take 1 tablet (10 mg total) by mouth daily. Sowles, Krichna, MD  Active   cyclobenzaprine (FLEXERIL) 10 MG tablet [id number]  Take 1 tablet (10 mg total) by mouth every 8 (eight) hours as needed for muscle spasms. Haviland, Margaret, CNM  Active   ibuprofen (ADVIL) 600 MG tablet [id number]  Take 1 tablet (600 mg total) by mouth every 6 (six) hours as needed for mild pain or moderate pain. Mackie, Anna M, CNM  Active   meclizine (ANTIVERT) 25 MG tablet [id number]    Patient not taking: Reported on 08/28/2020   [provider]  Active   montelukast (SINGULAIR) 10 MG tablet [id number]  Take 1 tablet (10 mg total) by mouth at bedtime. Sowles, Krichna, MD  Active   NIFEdipine (ADALAT CC) 60 MG 24 hr tablet [id number]  Take 1 tablet (60 mg total) by mouth daily. Mackie, Anna M, CNM  Active   polyethylene glycol powder (GLYCOLAX/MIRALAX) 17 GM/SCOOP powder [id number]  Take 17 g by mouth daily.  Patient taking differently: Take 17 g by mouth daily. As needed.   Sowles, Krichna, MD  Active   Prenatal Vit-Fe Fumarate-FA (PRENATAL MULTIVITAMIN) TABS tablet [id number]  Take 1 tablet by mouth daily at 12 noon. Sowles, Krichna, MD  Active   sertraline (  ZOLOFT) 100 MG tablet [id number] Yes Take 100 mg by mouth daily. [provider] Taking Active Self  Spacer/Aero-Holding [name] [id number]  See admin instructions. use with inhaler [provider]  Active Self  STIMULANT LAXATIVE 8.6-50 MG tablet [id number]  Take 2 tablets by mouth at bedtime as needed. [provider]  Active   Med List Note [name], [facility] 11/04/19 2355): Pt has been out of most meds for a few weeks           Patient Active Problem List   Diagnosis Date Noted  . Elevated blood pressure affecting pregnancy in third trimester, antepartum 07/25/2020  . Labor and delivery indication for care or intervention 07/25/2020  . Supervision of high risk pregnancy in third trimester 01/12/2020  . Mood disorder ([place]) 11/15/2018  . PTSD (post-traumatic stress disorder) 11/15/2018  . Class 1 obesity due to excess calories without serious comorbidity with body mass index (BMI) of 30.0 to 30.9 in adult 11/15/2018  . Neutropenia ([place]) 08/06/2018  . Major depression, recurrent, chronic ([place]) 08/06/2018  . GAD (generalized anxiety disorder) 08/06/2018  . Panic attack 01/22/2016  . Chronic allergic rhinitis 12/20/2015  . Asthma, moderate persistent, poorly-controlled 12/20/2015    Conditions to be addressed/monitored per PCP order:  Anxiety and Depression    Follow up:  Patient agrees to Care Plan and Follow-up.  Plan: The Managed Medicaid care management team will reach out to the patient again over the next 14 days.  Date/time of next scheduled Social Work care management/care coordination outreach:  09/20/20 at 10:30am  Mickel Fuchs, [place], [place] Medicaid Team

## 2020-08-31 NOTE — Patient Instructions (Signed)
Visit Information  Ms. Wahler was given information about Medicaid Managed Care team care coordination services as a part of their [facility] Medicaid benefit. BRADEN DELOACH verbally consented to engagement with the [facility] Managed Care team.   As a reminder your therapy appointment is on 09/13/20 at 2pm at [facility]. They are located at [address], [place]. They can be reached at [phone]. If you have any questions you can contact me at [phone].  For questions related to your [facility] health plan, please call: [phone]  If you would like to schedule transportation through your [facility] plan, please call the following number at least 2 days in advance of your appointment: [phone]  Goals Addressed   None       Social Worker will follow up in 14 days.   [name]

## 2020-09-03 ENCOUNTER — Other Ambulatory Visit: Payer: Self-pay

## 2020-09-03 ENCOUNTER — Other Ambulatory Visit: Payer: Self-pay | Admitting: Obstetrics and Gynecology

## 2020-09-03 NOTE — Patient Outreach (Signed)
Medicaid Managed Care   Nurse Care Manager Note  09/03/2020 Name:  Grace [name] MRN:  [id number] DOB:  Nov 03, 2000  Grace [name] is an 20 y.o. year old female who is a primary patient of Alba Cory, MD.  The Medicaid Managed Care Coordination team was consulted for assistance with:    Obstetrics healthcare management needs  [name] was given information about Medicaid Managed Care Coordination team services today. Grace [name] agreed to services and verbal consent obtained.  Engaged with patient by telephone for follow up visit in response to provider referral for case management and/or care coordination services.   Assessments/Interventions:  Review of past medical history, allergies, medications, health status, including review of consultants reports, laboratory and other test data, was performed as part of comprehensive evaluation and provision of chronic care management services.  SDOH (Social Determinants of Health) assessments and interventions performed:   Care Plan  No Known Allergies  Medications Reviewed Today    Reviewed by Danie Chandler, RN (Registered Nurse) on 09/03/20 at 1541  Med List Status: <None>  Medication Order Taking? Sig Documenting Provider Last Dose Status Informant  acetaminophen (TYLENOL) 325 MG tablet [id number] No Take 2 tablets (650 mg total) by mouth every 4 (four) hours as needed (for pain scale < 4). Gustavo Lah, CNM Taking Active   albuterol (VENTOLIN HFA) 108 (90 Base) MCG/ACT inhaler [id number] No Inhale 2 puffs into the lungs every 6 (six) hours as needed for wheezing or shortness of breath. Lucy Chris, PA Taking Active   ARIPiprazole (ABILIFY) 5 MG tablet [id number] No Take 5 mg by mouth daily. [provider] Taking Active Self  Blood Pressure KIT [id number] No 1 kit by Does not apply route daily. Notify provider for blood pressures less than 90/50 or 160/110 or greater Gustavo Lah, CNM Taking Active   cetirizine  (ZYRTEC) 10 MG tablet [id number] No Take 1 tablet (10 mg total) by mouth daily. Alba Cory, MD Taking Active   cyclobenzaprine (FLEXERIL) 10 MG tablet [id number] No Take 1 tablet (10 mg total) by mouth every 8 (eight) hours as needed for muscle spasms. Genia Del, CNM Taking Active   ibuprofen (ADVIL) 600 MG tablet [id number] No Take 1 tablet (600 mg total) by mouth every 6 (six) hours as needed for mild pain or moderate pain. Gustavo Lah, CNM Taking Active   meclizine (ANTIVERT) 25 MG tablet [id number] No   Patient not taking: Reported on 08/28/2020   [provider] Not Taking Active   montelukast (SINGULAIR) 10 MG tablet [id number] No Take 1 tablet (10 mg total) by mouth at bedtime. Alba Cory, MD Taking Active   NIFEdipine (ADALAT CC) 60 MG 24 hr tablet [id number] No Take 1 tablet (60 mg total) by mouth daily. Gustavo Lah, CNM Taking Active   polyethylene glycol powder (GLYCOLAX/MIRALAX) 17 GM/SCOOP powder [id number] No Take 17 g by mouth daily.  Patient taking differently: Take 17 g by mouth daily. As needed.   Alba Cory, MD Taking Active   Prenatal Vit-Fe Fumarate-FA (PRENATAL MULTIVITAMIN) TABS tablet [id number] No Take 1 tablet by mouth daily at 12 noon. Alba Cory, MD Taking Active   sertraline (ZOLOFT) 100 MG tablet [id number] No Take 100 mg by mouth daily. [provider] Taking Active Self  Spacer/Aero-Holding Eula Listen Sharryn-LG Russell) [place] [id number] No See admin instructions. use with inhaler [provider] Taking Active Self  STIMULANT LAXATIVE 8.6-50 MG tablet [id number] No Take 2 tablets by  mouth at bedtime as needed. [provider] Taking Active   Med List Note [name], [facility] 11/04/19 4034): Pt has been out of most meds for a few weeks          Patient Active Problem List   Diagnosis Date Noted  . Elevated blood pressure affecting pregnancy in third trimester, antepartum 07/25/2020  . Labor  and delivery indication for care or intervention 07/25/2020  . Supervision of high risk pregnancy in third trimester 01/12/2020  . Mood disorder ([place]) 11/15/2018  . PTSD (post-traumatic stress disorder) 11/15/2018  . Class 1 obesity due to excess calories without serious comorbidity with body mass index (BMI) of 30.0 to 30.9 in adult 11/15/2018  . Neutropenia ([place]) 08/06/2018  . Major depression, recurrent, chronic ([place]) 08/06/2018  . GAD (generalized anxiety disorder) 08/06/2018  . Panic attack 01/22/2016  . Chronic allergic rhinitis 12/20/2015  . Asthma, moderate persistent, poorly-controlled 12/20/2015    Conditions to be addressed/monitored per PCP order:  as stated above. urse Case Manager Clinical Goal(s):  Marland Kitchen Over the next 30 days, patient will verbalize understanding of plan for transportation needs. . Over the next 30 days, patient will work with medical provider  to address needs related to ongoing pelvic pain . Update 08/28/20:  Patient to get PT referral from provider for outpatient PT services at visit.Current Barriers:  . Patient currently 37.[redacted] weeks gestation and experiencing pelvic pain.Patient delivered 07/26/20. Marland Kitchen Patient unable to attend medical appointment last week due to transportation issues. .  . Update 09/03/20:  patient has appointment with provider 09/12/20 . Over the next 30 days, patient will attend all scheduled medical appointments:  Interventions:  . Inter-disciplinary care team collaboration (see longitudinal plan of care) . Evaluation of current treatment plan related to pregnancy and patient's adherence to plan as established by provider. . Advised patient to contact medical provider's office with increasing pain for evaluation. . Reviewed medications with patient. . Discussed plans with patient for ongoing care management follow up and provided patient with direct contact information for care management team . Reviewed scheduled/upcoming provider  appointments. . Care Guide referral for transportation needs.  Patient Goals/Self-Care Activities Over the next 30 days, patient will:  -Patient will attend all scheduled appointments. Patient verbalizes understanding of plan for transportation. Attends all scheduled provider appointments Follow Up Plan: The patient will call call medical provider's office as advised to for any increasing pelvic pain/pressure. Care Guide will contact patient regarding transportation issues. The Managed Medicaid care management team will reach out to the patient again over the next 7-`14 days.  The patient has been provided with contact information for the Managed Medicaid care management team and has been advised to call with any health related questions or concerns.  OB Visit appointment: is scheduled for Wednesday, Dec 1. Update 08/28/20: SVD 07/26/20.      Patient Care Plan: General Plan of Care (Adult)    Problem Identified: Health Promotion or Disease Self-Management (General Plan of Care)     Goal: Self-Management Plan Developed   Start Date: 07/23/2020  Expected End Date: 10/22/2020  This Visit's Progress: Not on track  Priority: High  Note:      Problem Identified: Coping Skills (General Plan of Care)   Priority: High  Onset Date: 07/23/2020  Note:   Current Barriers:  . Care Coordination needs related to anxiety and depression . Transportation barriers   Nurse Case Manager Clinical Goal(s):  Marland Kitchen Over the next 30 days, patient will  work with CM clinical social worker to address needs related to anxiety and depression.  Interventions:  . Inter-disciplinary care team collaboration (see longitudinal plan of care) . Collaborated with LCSW regarding patient. . Discussed plans with patient for ongoing care management follow up and provided patient with direct contact information for care management team . Social Work referral for anxiety/depression.  Patient Goals/Self-Care  Activities Over the next 30 days, patient will:  -Patient will speak with LCSW concerning anxiety and depression.  Follow Up Plan: Patient will follow up with LCSW regarding counseling needs.. Licensed Clinical Social Worker will schedule appointment for patient for evaluation of current needs. The Managed Medicaid care management team will reach out to the patient again over the next 7-14 days.  The patient has been provided with contact information for the Managed Medicaid care management team and has been advised to call with any health related questions or concerns.       Long-Range Goal: Coping Skills Enhanced   Start Date: 07/23/2020  Expected End Date: 10/22/2020  This Visit's Progress: On track  Recent Progress: Not on track  Priority: High  Note:   Patient will follow up with LCSW regarding counseling needs.. Licensed Clinical Social Worker will schedule appointment for patient for evaluation of current needs. The Managed Medicaid care management team will reach out to the patient again over the next 7-14 days.  The patient has been provided with contact information for the Managed Medicaid care management team and has been advised to call with any health related questions or concerns.    Patient Care Plan: General Social Work (Adult)    Problem Identified: Symptoms (Depression)     Long-Range Goal: Symptoms Monitored and Managed   Start Date: 07/25/2020  Expected End Date: 10/23/2020  Recent Progress: On track  Priority: High  Note:   Current Barriers:  . Chronic Mental Health needs related to depression and anxiety and past trauma experiences. . Financial constraints related to unemployment. . Transportation . Substance abuse issues -  Patient reports she smoked marijuana until she was about 4 months pregnant. She also smoked cigarettes until about 3 or 4 weeks ago. She now vapes. Leodis [place] knowledge of community resource: Patient was receiving outpatient therapy at [facility], but her services were terminated because they do not accept her Medicaid plan.  . Suicidal Ideation/Homicidal Ideation: No  Clinical Social Work Goal(s):  Marland Kitchen Over the next 90 days, patient will work with SW monthly by telephone or in person to reduce or manage symptoms related to depression, anxiety and past trauma. . Over the next 60 days, patient will work with SW to address concerns related to depression and anxiety . Over the next 60 days, patient will work with BSW to address needs related to scheduling appointment with appropriate outpatient providers for follow up.   Interventions: . Patient interviewed and appropriate assessments performed: PHQ 2 and PHQ 9 . Patient interviewed and appropriate assessments performed . Provided mental health counseling with regard to depression and being a young mother. Update 08/29/2020: Patient reported that "everything is stressing her." She denied SI/HI/AVH. LCSW counseled patient about being a new parent and pacing herself with tasks she has set for herself, and cautioned against feeling like a failure if she doesn't do everything in the exact order she would like. LCSW discussed coping skills and interventions for her to utilize whenever she feels stressed. . Provided patient with information about caring for self and utilizing support system after  childbirth. . Discussed plans with patient for ongoing care management follow up and provided patient with direct contact information for care management team . Collaborated with RN Case Manager re: outpatient therapy and medication management needs . Collaborated with BSW re: scheduling appointment with [facility] [phone]. . Brief CBT, Emotional/Supportive Counseling, Psychoeducation and/or Health Education, and Referred to counselor/psychotherapist . BSW contacted [facility] to make a new patient appointment. BSW was informed that  a referral has to be submitted. BSW will send referral. Referral sent on 08/06/20. Marland Kitchen Update 08/29/2020: Patient stated she has not received a call from [facility] to schedule appointments. LCSW will collaborate with BSW regarding scheduling. LCSW will consider referring to another agency if unable to get patient scheduled [facility] [phone] or [phone]). . BSW contacted [facility] and scheduled patient for an appointment on 09/13/20 at 2pm.  . BSW contacted patient to inform her of her therapy appointment on 09/13/20 at 2pm. Patient stated she would go to the appointment and asked for an email for a reminder and for the address. Patient's email [email or web address].  Patient Self Care Activities:  . Performs ADL's independently . Performs IADL's independently . Ability for insight . Motivation for treatment . Strong family or social support  Patient Self Care Deficits:  . Does not attend all scheduled provider appointments . Former marijuana use . Update 08/29/2020: Patient is a young, new mother. She was recently diagnosed with Covid after giving birth; she had mild symptoms and she had been vaccinated. Her newborn son was also diagnosed with Covid and was recently discharged on 08/28/2020. She has started back attending college as well, and she plans to go back to work (she has an interview today).           Patient Care Plan: Hypertension (Adult)    Problem Identified: Hypertension (Hypertension)     Long-Range Goal: Hypertension Monitored   Start Date: 08/28/2020  Expected End Date: 11/26/2020  This Visit's Progress: Not on track  Priority: High  Note:   Evidence-based guidance:   Promote initial use of ambulatory blood pressure measurements (for 3 days) to rule out "white-coat" effect; identify masked hypertension and presence or absence of nocturnal "dipping" of blood pressure.    Encourage continued use of  home blood pressure monitoring and recording in blood pressure log; include symptoms of hypotension or potential medication side effects in log.   Review blood pressure measurements taken inside and outside of the provider office; establish baseline and monitor trends; compare to target ranges or patient goal.   Share overall cardiovascular risk with patient; encourage changes to lifestyle risk factors, including alcohol consumption, smoking, inadequate exercise, poor dietary habits and stress.      Task: Identify and Monitor Blood Pressure Elevation Completed 08/28/2020  Note:   Care Management Activities:    - blood pressure trends reviewed - home or ambulatory blood pressure monitoring encouraged    Notes:     Follow Up:  Patient agrees to Care Plan and Follow-up.  Plan: The Managed Medicaid care management team will reach out to the patient again over the next 30 days. and The patient has been provided with contact information for the Managed Medicaid care management team and has been advised to call with any health related questions or concerns.  Date/time of next scheduled RN care management/care coordination outreach:  09/28/20 at 1030

## 2020-09-03 NOTE — Patient Instructions (Signed)
Hi Grace [name], thank you for speaking with me today.  Grace [name] was given information about Medicaid Managed Care team care coordination services as a part of their [facility] Medicaid benefit. Grace [name] verbally consented to engagement with the [facility] Managed Care team.   For questions related to your [facility] health plan, please call: [phone]  If you would like to schedule transportation through your [facility] plan, please call the following number at least 2 days in advance of your appointment: [phone]  Goals Addressed            This Visit's Progress   . [place] My Quality of Life       Nurse Case Manager Clinical Goal(s):  Marland Kitchen Over the next 30 days, patient will verbalize understanding of plan for transportation needs. . Over the next 30 days, patient will work with medical provider  to address needs related to ongoing pelvic pain . Update 08/28/20:  Patient to get PT referral from provider for outpatient PT services at visit.Current Barriers:  . Patient currently 37.[redacted] weeks gestation and experiencing pelvic pain. Patient delivered 07/26/20. Marland Kitchen Patient unable to attend medical appointment last week due to transportation issues. .  . Update 09/03/20:  patient has appointment with provider 09/12/20 . Over the next 30 days, patient will attend all scheduled medical appointments:  Interventions:  . Inter-disciplinary care team collaboration (see longitudinal plan of care) . Evaluation of current treatment plan related to pregnancy and patient's adherence to plan as established by provider. . Advised patient to contact medical provider's office with increasing pain for evaluation. . Reviewed medications with patient. . Discussed plans with patient for ongoing care management follow up and provided patient with direct contact information for care management team . Reviewed scheduled/upcoming provider appointments. . Care Guide referral for transportation  needs.  Patient Goals/Self-Care Activities Over the next 30 days, patient will:  -Patient will attend all scheduled appointments. Patient verbalizes understanding of plan for transportation. Attends all scheduled provider appointments Follow Up Plan: The patient will call call medical provider's office as advised to for any increasing pelvic pain/pressure. Care Guide will contact patient regarding transportation issues. The Managed Medicaid care management team will reach out to the patient again over the next 7-`14 days.  The patient has been provided with contact information for the Managed Medicaid care management team and has been advised to call with any health related questions or concerns.  OB Visit appointment: is scheduled for Wednesday, Dec 1. Update 08/28/20: SVD 07/26/20.      Patient Care Plan: General Plan of Care (Adult)    Problem Identified: Health Promotion or Disease Self-Management (General Plan of Care)     Goal: Self-Management Plan Developed   Start Date: 07/23/2020  Expected End Date: 10/22/2020  This Visit's Progress: Not on track  Priority: High  Note:     Problem Identified: Coping Skills (General Plan of Care)   Priority: High  Onset Date: 07/23/2020  Note:   Current Barriers:  . Care Coordination needs related to anxiety and depression . Transportation barriers   Nurse Case Manager Clinical Goal(s):  Marland Kitchen Over the next 30 days, patient will work with CM clinical social worker to address needs related to anxiety and depression.  Interventions:  . Inter-disciplinary care team collaboration (see longitudinal plan of care) . Collaborated with LCSW regarding patient. . Discussed plans with patient for ongoing care management follow up and provided patient with direct contact information for care management team .  Social Work referral for anxiety/depression.  Patient Goals/Self-Care Activities Over the next 30 days, patient will:  -Patient will speak  with LCSW concerning anxiety and depression.  Follow Up Plan: Patient will follow up with LCSW regarding counseling needs.. Licensed Clinical Social Worker will schedule appointment for patient for evaluation of current needs. The Managed Medicaid care management team will reach out to the patient again over the next 7-14 days.  The patient has been provided with contact information for the Managed Medicaid care management team and has been advised to call with any health related questions or concerns.       Long-Range Goal: Coping Skills Enhanced   Start Date: 07/23/2020  Expected End Date: 10/22/2020  This Visit's Progress: On track  Recent Progress: Not on track  Priority: High  Note:   Patient will follow up with LCSW regarding counseling needs.. Licensed Clinical Social Worker will schedule appointment for patient for evaluation of current needs. The Managed Medicaid care management team will reach out to the patient again over the next 7-14 days.  The patient has been provided with contact information for the Managed Medicaid care management team and has been advised to call with any health related questions or concerns.    Patient Care Plan: General Social Work (Adult)    Problem Identified: Symptoms (Depression)     Long-Range Goal: Symptoms Monitored and Managed   Start Date: 07/25/2020  Expected End Date: 10/23/2020  Recent Progress: On track  Priority: High  Note:   Current Barriers:  . Chronic Mental Health needs related to depression and anxiety and past trauma experiences. . Financial constraints related to unemployment. . Transportation . Substance abuse issues -  Patient reports she smoked marijuana until she was about 4 months pregnant. She also smoked cigarettes until about 3 or 4 weeks ago. She now vapes. Grace [name] knowledge of community resource: Patient was receiving outpatient therapy at [facility], but her services were terminated because they do not accept  her Medicaid plan.  . Suicidal Ideation/Homicidal Ideation: No  Clinical Social Work Goal(s):  Marland Kitchen Over the next 90 days, patient will work with SW monthly by telephone or in person to reduce or manage symptoms related to depression, anxiety and past trauma. . Over the next 60 days, patient will work with SW to address concerns related to depression and anxiety . Over the next 60 days, patient will work with BSW to address needs related to scheduling appointment with appropriate outpatient providers for follow up.   Interventions: . Patient interviewed and appropriate assessments performed: PHQ 2 and PHQ 9 . Patient interviewed and appropriate assessments performed . Provided mental health counseling with regard to depression and being a young mother. Update 08/29/2020: Patient reported that "everything is stressing her." She denied SI/HI/AVH. LCSW counseled patient about being a new parent and pacing herself with tasks she has set for herself, and cautioned against feeling like a failure if she doesn't do everything in the exact order she would like. LCSW discussed coping skills and interventions for her to utilize whenever she feels stressed. . Provided patient with information about caring for self and utilizing support system after childbirth. . Discussed plans with patient for ongoing care management follow up and provided patient with direct contact information for care management team . Collaborated with RN Case Manager re: outpatient therapy and medication management needs . Collaborated with BSW re: scheduling appointment with [facility] [phone]. . Brief CBT, Emotional/Supportive Counseling, Psychoeducation  and/or Health Education, and Referred to counselor/psychotherapist . BSW contacted [facility] to make a new patient appointment. BSW was informed that a referral has to be submitted. BSW will send referral. Referral sent on  08/06/20. Marland Kitchen Update 08/29/2020: Patient stated she has not received a call from [facility] to schedule appointments. LCSW will collaborate with BSW regarding scheduling. LCSW will consider referring to another agency if unable to get patient scheduled [facility] [phone] or [phone]). . BSW contacted [facility] and scheduled patient for an appointment on 09/13/20 at 2pm.  . BSW contacted patient to inform her of her therapy appointment on 09/13/20 at 2pm. Patient stated she would go to the appointment and asked for an email for a reminder and for the address. Patient's email [email or web address].  Patient Self Care Activities:  . Performs ADL's independently . Performs IADL's independently . Ability for insight . Motivation for treatment . Strong family or social support  Patient Self Care Deficits:  . Does not attend all scheduled provider appointments . Former marijuana use . Update 08/29/2020: Patient is a young, new mother. She was recently diagnosed with Covid after giving birth; she had mild symptoms and she had been vaccinated. Her newborn son was also diagnosed with Covid and was recently discharged on 08/28/2020. She has started back attending college as well, and she plans to go back to work (she has an interview today).    Task: Alleviate Barriers to Depression Treatment       Patient Care Plan: Hypertension (Adult)    Problem Identified: Hypertension (Hypertension)     Long-Range Goal: Hypertension Monitored   Start Date: 08/28/2020  Expected End Date: 11/26/2020  This Visit's Progress: Not on track  Priority: High  Note:   Evidence-based guidance:   Promote initial use of ambulatory blood pressure measurements (for 3 days) to rule out "white-coat" effect; identify masked hypertension and presence or absence of nocturnal "dipping" of blood pressure.    Encourage continued use of home blood pressure  monitoring and recording in blood pressure log; include symptoms of hypotension or potential medication side effects in log.   Review blood pressure measurements taken inside and outside of the provider office; establish baseline and monitor trends; compare to target ranges or patient goal.   Share overall cardiovascular risk with patient; encourage changes to lifestyle risk factors, including alcohol consumption, smoking, inadequate exercise, poor dietary habits and stress.      Task: Identify and Monitor Blood Pressure Elevation Completed 08/28/2020  Note:   Care Management Activities:    - blood pressure trends reviewed - home or ambulatory blood pressure monitoring encouraged    Notes:    Patient verbalizes understanding of instructions provided today.   The Managed Medicaid care management team will reach out to the patient again over the next 30 days.  The patient has been provided with contact information for the Managed Medicaid care management team and has been advised to call with any health related questions or concerns.    Kathi Der RN, BSN [place]  [employer], [employer] - Managed Medicaid High Risk [phone].

## 2020-09-04 ENCOUNTER — Other Ambulatory Visit: Payer: Self-pay

## 2020-09-04 NOTE — Patient Outreach (Signed)
Medicaid Managed Care    Pharmacy Note  09/04/2020 Name: Grace [name] MRN: [id number] DOB: 2001/02/09  Grace [name] is a 20 y.o. year old female who is a primary care patient of Steele Sizer, MD. [facility] Managed Care Coordination team was consulted for assistance with disease management and care coordination needs.    Engaged with patient Engaged with patient by telephone for initial visit in response to referral for case management and/or care coordination services.  Ms. Smaltz was given information about Managed Medicaid Care Coordination team services today. Grace [name] agreed to services and verbal consent obtained.   Objective:  Lab Results  Component Value Date   CREATININE 0.68 07/27/2020   CREATININE 0.66 07/25/2020   CREATININE 0.49 05/09/2020    Lab Results  Component Value Date   HGBA1C 5.2 12/06/2018       Component Value Date/Time   CHOL 187 (H) 12/06/2018 1126   TRIG 124 (H) 12/06/2018 1126   HDL 50 12/06/2018 1126   CHOLHDL 3.7 12/06/2018 1126   LDLCALC 114 (H) 12/06/2018 1126    Other: (TSH, CBC, Vit D, etc.)  Clinical ASCVD: No  The ASCVD Risk score Mikey Bussing DC Jr., et al., 2013) failed to calculate for the following reasons:   The 2013 ASCVD risk score is only valid for ages 34 to 28    Other: (CHADS2VASc if Afib, PHQ9 if depression, MMRC or CAT for COPD, ACT, DEXA)  BP Readings from Last 3 Encounters:  07/28/20 139/85  07/01/20 129/83  05/26/20 (!) 119/56    Assessment/Interventions: Review of patient past medical history, allergies, medications, health status, including review of consultants reports, laboratory and other test data, was performed as part of comprehensive evaluation and provision of chronic care management services.   Depression Depression screen [facility] 2/9 08/29/2020 07/25/2020 06/26/2020  Decreased Interest 2 3 2   Down, Depressed, Hopeless 2 2 2   PHQ - 2 Score 4 5 4   Altered sleeping 1 3 1   Tired, decreased energy 1 3 1    Change in appetite 1 3 1   Feeling bad or failure about yourself  1 3 3   Trouble concentrating 2 1 1   Moving slowly or fidgety/restless 2 1 1   Suicidal thoughts 2 0 0  PHQ-9 Score 14 19 12   Difficult doing work/chores Extremely dIfficult Somewhat difficult Extremely dIfficult  Some recent data might be hidden   Patient states her meds aren't working. The regiment she had pre-pregnancy was great but she had to stop for the baby. Plan: Has appt on  20th with Psych to change regimen   Pregnancy HTN Nifedipine -Patient non-compliant -132/70  Plan: Patient doesn't want to take, will have appt on 09/12/20 to determine if she should still be on it  Meds -Patient very non-compliant on meds -Patient just started new job at [employer], is in school, and had son named [place] in December. She states it's hard to remember to pick up all her meds at the pharmacy Plan: Change all meds to 90 days, sync meds, this way she has to worry about them 4x/year Verbal consent obtained for [facility] enhanced pharmacy services (medication synchronization, adherence packaging, delivery coordination). A medication sync plan was created to allow patient to get all medications delivered once every 30 to 90 days per patient preference. Patient understands they have freedom to choose pharmacy and clinical pharmacist will coordinate care between all prescribers and [facility].     SDOH (Social Determinants of Health) assessments and interventions  performed:    Care Plan  No Known Allergies  Medications Reviewed Today    Reviewed by Zettie Pho, [facility] (Pharmacist) on 09/04/20 at 1515  Med List Status: <None>  Medication Order Taking? Sig Documenting Provider Last Dose Status Informant  acetaminophen (TYLENOL) 325 MG tablet [id number] No Take 2 tablets (650 mg total) by mouth every 4 (four) hours as needed (for pain scale < 4).  Patient not taking: Reported on 09/04/2020   Gustavo Lah, CNM  Not Taking Consider Medication Status and Discontinue   albuterol (VENTOLIN HFA) 108 (90 Base) MCG/ACT inhaler [id number] Yes Inhale 2 puffs into the lungs every 6 (six) hours as needed for wheezing or shortness of breath. Lucy Chris, PA Taking Active   ARIPiprazole (ABILIFY) 5 MG tablet [id number] Yes Take 5 mg by mouth daily. [provider] Taking Active Self  Blood Pressure KIT [id number] Yes 1 kit by Does not apply route daily. Notify provider for blood pressures less than 90/50 or 160/110 or greater Gustavo Lah, CNM Taking Active   cetirizine (ZYRTEC) 10 MG tablet [id number] Yes Take 1 tablet (10 mg total) by mouth daily. Alba Cory, MD Taking Active   cyclobenzaprine (FLEXERIL) 10 MG tablet [id number] No Take 1 tablet (10 mg total) by mouth every 8 (eight) hours as needed for muscle spasms.  Patient not taking: Reported on 09/04/2020   Genia Del, CNM Not Taking Consider Medication Status and Discontinue   ibuprofen (ADVIL) 600 MG tablet [id number] No Take 1 tablet (600 mg total) by mouth every 6 (six) hours as needed for mild pain or moderate pain.  Patient not taking: Reported on 09/04/2020   Gustavo Lah, CNM Not Taking Consider Medication Status and Discontinue   meclizine (ANTIVERT) 25 MG tablet [id number] No   Patient not taking: No sig reported   [provider] Not Taking Consider Medication Status and Discontinue   montelukast (SINGULAIR) 10 MG tablet [id number] No Take 1 tablet (10 mg total) by mouth at bedtime.  Patient not taking: Reported on 09/04/2020   Alba Cory, MD Not Taking Consider Medication Status and Discontinue   NIFEdipine (ADALAT CC) 60 MG 24 hr tablet [id number] No Take 1 tablet (60 mg total) by mouth daily.  Patient not taking: Reported on 09/04/2020   Gustavo Lah, CNM Not Taking Active   polyethylene glycol powder (GLYCOLAX/MIRALAX) 17 GM/SCOOP powder [id number] Yes Take 17 g by mouth daily.  Patient taking differently:  Take 17 g by mouth daily. As needed.   Alba Cory, MD Taking Active   Prenatal Vit-Fe Fumarate-FA (PRENATAL MULTIVITAMIN) TABS tablet [id number] Yes Take 1 tablet by mouth daily at 12 noon. Alba Cory, MD Taking Active   sertraline (ZOLOFT) 100 MG tablet [id number] Yes Take 100 mg by mouth daily. [provider] Taking Active Self  Spacer/Aero-Holding Eula Listen Glor[facility] [place]) [place] [id number] No See admin instructions. use with inhaler  Patient not taking: Reported on 09/04/2020   [provider] Not Taking Consider Medication Status and Discontinue Self  STIMULANT LAXATIVE 8.6-50 MG tablet [id number] No Take 2 tablets by mouth at bedtime as needed.  Patient not taking: Reported on 09/04/2020   [provider] Not Taking Consider Medication Status and Discontinue   Med List Note Albina Billet, [facility] 11/04/19 8325): Pt has been out of most meds for a few weeks          Patient Active Problem List   Diagnosis Date Noted  . Elevated blood  pressure affecting pregnancy in third trimester, antepartum 07/25/2020  . Labor and delivery indication for care or intervention 07/25/2020  . Supervision of high risk pregnancy in third trimester 01/12/2020  . Mood disorder (HCC) 11/15/2018  . PTSD (post-traumatic stress disorder) 11/15/2018  . Class 1 obesity due to excess calories without serious comorbidity with body mass index (BMI) of 30.0 to 30.9 in adult 11/15/2018  . Neutropenia (HCC) 08/06/2018  . Major depression, recurrent, chronic (HCC) 08/06/2018  . GAD (generalized anxiety disorder) 08/06/2018  . Panic attack 01/22/2016  . Chronic allergic rhinitis 12/20/2015  . Asthma, moderate persistent, poorly-controlled 12/20/2015    Conditions to be addressed/monitored: Depression  Patient Care Plan: General Plan of Care (Adult)    Problem Identified: Health Promotion or Disease Self-Management (General Plan of Care)     Goal: Self-Management  Plan Developed   Start Date: 07/23/2020  Expected End Date: 10/22/2020  This Visit's Progress: Not on track  Priority: High  Note:   Current Barriers:  . Patient currently 37.[redacted] weeks gestation and experiencing pelvic pain. . Patient unable to attend medical appointment last week due to transportation issues.  Nurse Case Manager Clinical Goal(s):  Marland Kitchen Over the next 30 days, patient will verbalize understanding of plan for transportation needs. . Over the next 30 days, patient will work with medical provider  to address needs related to ongoing pelvic pain . Over the next 30 days, patient will attend all scheduled medical appointments:  Interventions:  . Inter-disciplinary care team collaboration (see longitudinal plan of care) . Evaluation of current treatment plan related to pregnancy and patient's adherence to plan as established by provider. . Advised patient to contact medical provider's office with increasing pain for evaluation. . Reviewed medications with patient. . Discussed plans with patient for ongoing care management follow up and provided patient with direct contact information for care management team . Reviewed scheduled/upcoming provider appointments. . Care Guide referral for transportation needs.  Patient Goals/Self-Care Activities Over the next 30 days, patient will:  -Patient will attend all scheduled appointments. Patient verbalizes understanding of plan for transportation. Attends all scheduled provider appointments Follow Up Plan: The patient will call call medical provider's office as advised to for any increasing pelvic pain/pressure. Care Guide will contact patient regarding transportation issues. The Managed Medicaid care management team will reach out to the patient again over the next 7-`14 days.  The patient has been provided with contact information for the Managed Medicaid care management team and has been advised to call with any health related questions  or concerns.  OB Visit appointment: is scheduled for Wednesday, Dec 1.       Problem Identified: Coping Skills (General Plan of Care)   Priority: High  Onset Date: 07/23/2020  Note:   Current Barriers:  . Care Coordination needs related to anxiety and depression . Transportation barriers   Nurse Case Manager Clinical Goal(s):  Marland Kitchen Over the next 30 days, patient will work with CM clinical social worker to address needs related to anxiety and depression.  Interventions:  . Inter-disciplinary care team collaboration (see longitudinal plan of care) . Collaborated with LCSW regarding patient. . Discussed plans with patient for ongoing care management follow up and provided patient with direct contact information for care management team . Social Work referral for anxiety/depression.  Patient Goals/Self-Care Activities Over the next 30 days, patient will:  -Patient will speak with LCSW concerning anxiety and depression.  Follow Up Plan: Patient will follow up with LCSW regarding counseling  needs.. Licensed Clinical Social Worker will schedule appointment for patient for evaluation of current needs. The Managed Medicaid care management team will reach out to the patient again over the next 7-14 days.  The patient has been provided with contact information for the Managed Medicaid care management team and has been advised to call with any health related questions or concerns.       Long-Range Goal: Coping Skills Enhanced   Start Date: 07/23/2020  Expected End Date: 10/22/2020  This Visit's Progress: On track  Recent Progress: Not on track  Priority: High  Note:   Patient will follow up with LCSW regarding counseling needs.. Licensed Clinical Social Worker will schedule appointment for patient for evaluation of current needs. The Managed Medicaid care management team will reach out to the patient again over the next 7-14 days.  The patient has been provided with contact information  for the Managed Medicaid care management team and has been advised to call with any health related questions or concerns.    Patient Care Plan: General Social Work (Adult)    Problem Identified: Symptoms (Depression)     Long-Range Goal: Symptoms Monitored and Managed   Start Date: 07/25/2020  Expected End Date: 10/23/2020  Recent Progress: On track  Priority: High  Note:   Current Barriers:  . Chronic Mental Health needs related to depression and anxiety and past trauma experiences. . Financial constraints related to unemployment. . Transportation . Substance abuse issues -  Patient reports she smoked marijuana until she was about 4 months pregnant. She also smoked cigarettes until about 3 or 4 weeks ago. She now vapes. Leodis Liverpool knowledge of community resource: Patient was receiving outpatient therapy at [facility], but her services were terminated because they do not accept her Medicaid plan.  . Suicidal Ideation/Homicidal Ideation: No  Clinical Social Work Goal(s):  Marland Kitchen Over the next 90 days, patient will work with SW monthly by telephone or in person to reduce or manage symptoms related to depression, anxiety and past trauma. . Over the next 60 days, patient will work with SW to address concerns related to depression and anxiety . Over the next 60 days, patient will work with BSW to address needs related to scheduling appointment with appropriate outpatient providers for follow up.   Interventions: . Patient interviewed and appropriate assessments performed: PHQ 2 and PHQ 9 . Patient interviewed and appropriate assessments performed . Provided mental health counseling with regard to depression and being a young mother. Update 08/29/2020: Patient reported that "everything is stressing her." She denied SI/HI/AVH. LCSW counseled patient about being a new parent and pacing herself with tasks she has set for herself, and cautioned against feeling like a failure if she doesn't do everything  in the exact order she would like. LCSW discussed coping skills and interventions for her to utilize whenever she feels stressed. . Provided patient with information about caring for self and utilizing support system after childbirth. . Discussed plans with patient for ongoing care management follow up and provided patient with direct contact information for care management team . Collaborated with RN Case Manager re: outpatient therapy and medication management needs . Collaborated with BSW re: scheduling appointment with [facility] [phone]. . Brief CBT, Emotional/Supportive Counseling, Psychoeducation and/or Health Education, and Referred to counselor/psychotherapist . BSW contacted [facility] to make a new patient appointment. BSW was informed that a referral has to be submitted. BSW will send referral. Referral sent on 08/06/20. Marland Kitchen  Update 08/29/2020: Patient stated she has not received a call from [facility] to schedule appointments. LCSW will collaborate with BSW regarding scheduling. LCSW will consider referring to another agency if unable to get patient scheduled [facility] [phone] or [phone]). . BSW contacted [facility] and scheduled patient for an appointment on 09/13/20 at 2pm.  . BSW contacted patient to inform her of her therapy appointment on 09/13/20 at 2pm. Patient stated she would go to the appointment and asked for an email for a reminder and for the address. Patient's email [email or web address].  Patient Self Care Activities:  . Performs ADL's independently . Performs IADL's independently . Ability for insight . Motivation for treatment . Strong family or social support  Patient Self Care Deficits:  . Does not attend all scheduled provider appointments . Former marijuana use . Update 08/29/2020: Patient is a young, new mother. She was  recently diagnosed with Covid after giving birth; she had mild symptoms and she had been vaccinated. Her newborn son was also diagnosed with Covid and was recently discharged on 08/28/2020. She has started back attending college as well, and she plans to go back to work (she has an interview today).    Task: Alleviate Barriers to Depression Treatment   Note:   Care Management Activities:        Notes:    Patient Care Plan: Hypertension (Adult)    Problem Identified: Hypertension (Hypertension)     Long-Range Goal: Hypertension Monitored   Start Date: 08/28/2020  Expected End Date: 11/26/2020  This Visit's Progress: Not on track  Priority: High  Note:   Evidence-based guidance:   Promote initial use of ambulatory blood pressure measurements (for 3 days) to rule out "white-coat" effect; identify masked hypertension and presence or absence of nocturnal "dipping" of blood pressure.    Encourage continued use of home blood pressure monitoring and recording in blood pressure log; include symptoms of hypotension or potential medication side effects in log.   Review blood pressure measurements taken inside and outside of the provider office; establish baseline and monitor trends; compare to target ranges or patient goal.   Share overall cardiovascular risk with patient; encourage changes to lifestyle risk factors, including alcohol consumption, smoking, inadequate exercise, poor dietary habits and stress.      Task: Identify and Monitor Blood Pressure Elevation Completed 08/28/2020  Note:   Care Management Activities:    - blood pressure equipment and technique reviewed    Notes:   Patient Care Plan: Medication Management    Problem Identified: Health Promotion or Disease Self-Management (General Plan of Care)     Goal: Self-Management Plan Developed   Note:   Current Barriers:  . Does not adhere to prescribed medication regimen .   Pharmacist Clinical Goal(s):  Marland Kitchen Over the next 30  days, patient will achieve adherence to monitoring guidelines and medication adherence to achieve therapeutic efficacy through collaboration with PharmD and provider.  .   Interventions: . Inter-disciplinary care team collaboration (see longitudinal plan of care) . Comprehensive medication review performed; medication list updated in electronic medical record . Verbal consent obtained for [facility] enhanced pharmacy services (medication synchronization, adherence packaging, delivery coordination). A medication sync plan was created to allow patient to get all medications delivered once every 30 to 90 days per patient preference. Patient understands they have freedom to choose pharmacy and clinical pharmacist will coordinate care between all prescribers and [facility]. .   @[email or web address] @  Patient  Goals/Self-Care Activities . Over the next 30 days, patient will:  - take medications as prescribed  Follow Up Plan: The care management team will reach out to the patient again over the next 30 days.     Task: Mutually Develop and Royce Macadamia Achievement of Patient Goals   Note:   Care Management Activities:        Notes:      Medication Assistance: None required. Patient affirms current coverage meets needs.   Follow up: Agree  Plan: The care management team will reach out to the patient again over the next 30 days.   [place] Constable, Pharm.D., Managed Medicaid Pharmacist - [phone]

## 2020-09-04 NOTE — Patient Instructions (Signed)
Visit Information  Grace [name] was given information about Medicaid Managed Care team care coordination services as a part of their [facility] Medicaid benefit. Grace [name] verbally consented to engagement with the [facility] Managed Care team.   For questions related to your [facility] health plan, please call: [phone]  If you would like to schedule transportation through your [facility] plan, please call the following number at least 2 days in advance of your appointment: [phone]  Grace [name] - following are the goals we discussed in your visit today:  @[email or web address]   Please see education materials related to HTN provided as print materials.   Patient verbalizes understanding of instructions provided today.   The Managed Medicaid care management team will reach out to the patient again over the next 30 days.   , [facility]  Following is a copy of your plan of care:  Patient Care Plan: General Plan of Care (Adult)    Problem Identified: Health Promotion or Disease Self-Management (General Plan of Care)     Goal: Self-Management Plan Developed   Start Date: 07/23/2020  Expected End Date: 10/22/2020  This Visit's Progress: Not on track  Priority: High  Note:   Current Barriers:  . Patient currently 37.[redacted] weeks gestation and experiencing pelvic pain. . Patient unable to attend medical appointment last week due to transportation issues.  Nurse Case Manager Clinical Goal(s):  10/24/2020 Over the next 30 days, patient will verbalize understanding of plan for transportation needs. . Over the next 30 days, patient will work with medical provider  to address needs related to ongoing pelvic pain . Over the next 30 days, patient will attend all scheduled medical appointments:  Interventions:  . Inter-disciplinary care team collaboration (see longitudinal plan of care) . Evaluation of current treatment plan related to pregnancy and patient's adherence to plan as  established by provider. . Advised patient to contact medical provider's office with increasing pain for evaluation. . Reviewed medications with patient. . Discussed plans with patient for ongoing care management follow up and provided patient with direct contact information for care management team . Reviewed scheduled/upcoming provider appointments. . Care Guide referral for transportation needs.  Patient Goals/Self-Care Activities Over the next 30 days, patient will:  -Patient will attend all scheduled appointments. Patient verbalizes understanding of plan for transportation. Attends all scheduled provider appointments Follow Up Plan: The patient will call call medical provider's office as advised to for any increasing pelvic pain/pressure. Care Guide will contact patient regarding transportation issues. The Managed Medicaid care management team will reach out to the patient again over the next 7-`14 days.  The patient has been provided with contact information for the Managed Medicaid care management team and has been advised to call with any health related questions or concerns.  OB Visit appointment: is scheduled for Wednesday, Dec 1.       Problem Identified: Coping Skills (General Plan of Care)   Priority: High  Onset Date: 07/23/2020  Note:   Current Barriers:  . Care Coordination needs related to anxiety and depression . Transportation barriers   Nurse Case Manager Clinical Goal(s):  07/25/2020 Over the next 30 days, patient will work with CM clinical social worker to address needs related to anxiety and depression.  Interventions:  . Inter-disciplinary care team collaboration (see longitudinal plan of care) . Collaborated with LCSW regarding patient. . Discussed plans with patient for ongoing care management follow up and provided patient with direct contact information for care management team .  Social Work referral for anxiety/depression.  Patient Goals/Self-Care  Activities Over the next 30 days, patient will:  -Patient will speak with LCSW concerning anxiety and depression.  Follow Up Plan: Patient will follow up with LCSW regarding counseling needs.. Licensed Clinical Social Worker will schedule appointment for patient for evaluation of current needs. The Managed Medicaid care management team will reach out to the patient again over the next 7-14 days.  The patient has been provided with contact information for the Managed Medicaid care management team and has been advised to call with any health related questions or concerns.       Long-Range Goal: Coping Skills Enhanced   Start Date: 07/23/2020  Expected End Date: 10/22/2020  This Visit's Progress: On track  Recent Progress: Not on track  Priority: High  Note:   Patient will follow up with LCSW regarding counseling needs.. Licensed Clinical Social Worker will schedule appointment for patient for evaluation of current needs. The Managed Medicaid care management team will reach out to the patient again over the next 7-14 days.  The patient has been provided with contact information for the Managed Medicaid care management team and has been advised to call with any health related questions or concerns.    Patient Care Plan: General Social Work (Adult)    Problem Identified: Symptoms (Depression)     Long-Range Goal: Symptoms Monitored and Managed   Start Date: 07/25/2020  Expected End Date: 10/23/2020  Recent Progress: On track  Priority: High  Note:   Current Barriers:  . Chronic Mental Health needs related to depression and anxiety and past trauma experiences. . Financial constraints related to unemployment. . Transportation . Substance abuse issues -  Patient reports she smoked marijuana until she was about 4 months pregnant. She also smoked cigarettes until about 3 or 4 weeks ago. She now vapes. Grace [name] knowledge of community resource: Patient was receiving outpatient therapy at [facility], but her services were terminated because they do not accept her Medicaid plan.  . Suicidal Ideation/Homicidal Ideation: No  Clinical Social Work Goal(s):  Marland Kitchen Over the next 90 days, patient will work with SW monthly by telephone or in person to reduce or manage symptoms related to depression, anxiety and past trauma. . Over the next 60 days, patient will work with SW to address concerns related to depression and anxiety . Over the next 60 days, patient will work with BSW to address needs related to scheduling appointment with appropriate outpatient providers for follow up.   Interventions: . Patient interviewed and appropriate assessments performed: PHQ 2 and PHQ 9 . Patient interviewed and appropriate assessments performed . Provided mental health counseling with regard to depression and being a young mother. Update 08/29/2020: Patient reported that "everything is stressing her." She denied SI/HI/AVH. LCSW counseled patient about being a new parent and pacing herself with tasks she has set for herself, and cautioned against feeling like a failure if she doesn't do everything in the exact order she would like. LCSW discussed coping skills and interventions for her to utilize whenever she feels stressed. . Provided patient with information about caring for self and utilizing support system after childbirth. . Discussed plans with patient for ongoing care management follow up and provided patient with direct contact information for care management team . Collaborated with RN Case Manager re: outpatient therapy and medication management needs . Collaborated with BSW re: scheduling appointment with [facility] [phone]. . Brief CBT, Emotional/Supportive Counseling, Psychoeducation  and/or Health Education, and Referred to counselor/psychotherapist . BSW contacted [facility] to make a new patient appointment. BSW was informed that  a referral has to be submitted. BSW will send referral. Referral sent on 08/06/20. Marland Kitchen Update 08/29/2020: Patient stated she has not received a call from [employer] to schedule appointments. LCSW will collaborate with BSW regarding scheduling. LCSW will consider referring to another agency if unable to get patient scheduled [employer] [phone] or [phone]). . BSW contacted [facility] [employer] [facility] and scheduled patient for an appointment on 09/13/20 at 2pm.  . BSW contacted patient to inform her of her therapy appointment on 09/13/20 at 2pm. Patient stated she would go to the appointment and asked for an email for a reminder and for the address. Patient's email [email or web address].  Patient Self Care Activities:  . Performs ADL's independently . Performs IADL's independently . Ability for insight . Motivation for treatment . Strong family or social support  Patient Self Care Deficits:  . Does not attend all scheduled provider appointments . Former marijuana use . Update 08/29/2020: Patient is a young, new mother. She was recently diagnosed with Covid after giving birth; she had mild symptoms and she had been vaccinated. Her newborn son was also diagnosed with Covid and was recently discharged on 08/28/2020. She has started back attending college as well, and she plans to go back to work (she has an interview today).    Task: Alleviate Barriers to Depression Treatment   Note:   Care Management Activities:        Notes:    Patient Care Plan: Hypertension (Adult)    Problem Identified: Hypertension (Hypertension)     Long-Range Goal: Hypertension Monitored   Start Date: 08/28/2020  Expected End Date: 11/26/2020  This Visit's Progress: Not on track  Priority: High  Note:   Evidence-based guidance:   Promote initial use of ambulatory blood pressure measurements (for 3 days) to rule out "white-coat" effect; identify masked  hypertension and presence or absence of nocturnal "dipping" of blood pressure.    Encourage continued use of home blood pressure monitoring and recording in blood pressure log; include symptoms of hypotension or potential medication side effects in log.   Review blood pressure measurements taken inside and outside of the provider office; establish baseline and monitor trends; compare to target ranges or patient goal.   Share overall cardiovascular risk with patient; encourage changes to lifestyle risk factors, including alcohol consumption, smoking, inadequate exercise, poor dietary habits and stress.      Task: Identify and Monitor Blood Pressure Elevation Completed 08/28/2020  Note:   Care Management Activities:    - blood pressure equipment and technique reviewed    Notes:   Patient Care Plan: Medication Management    Problem Identified: Health Promotion or Disease Self-Management (General Plan of Care)     Goal: Self-Management Plan Developed   Note:   Current Barriers:  . Does not adhere to prescribed medication regimen .   Pharmacist Clinical Goal(s):  Marland Kitchen Over the next 30 days, patient will achieve adherence to monitoring guidelines and medication adherence to achieve therapeutic efficacy through collaboration with PharmD and provider.  .   Interventions: . Inter-disciplinary care team collaboration (see longitudinal plan of care) . Comprehensive medication review performed; medication list updated in electronic medical record . Verbal consent obtained for [facility] enhanced pharmacy services (medication synchronization, adherence packaging, delivery coordination). A medication sync plan was created to allow patient to  get all medications delivered once every 30 to 90 days per patient preference. Patient understands they have freedom to choose pharmacy and clinical pharmacist will coordinate care between all prescribers and [facility]. .   @[email or web address] @  Patient Goals/Self-Care Activities . Over the next 30 days, patient will:  - take medications as prescribed  Follow Up Plan: The care management team will reach out to the patient again over the next 30 days.     Task: Mutually Develop and Malen GauzeFoster Achievement of Patient Goals   Note:   Care Management Activities:        Notes:

## 2020-09-20 ENCOUNTER — Other Ambulatory Visit: Payer: Self-pay

## 2020-09-20 ENCOUNTER — Ambulatory Visit: Payer: Self-pay

## 2020-09-20 NOTE — Patient Instructions (Signed)
Visit Information  Grace [name] was given information about Medicaid Managed Care team care coordination services as a part of their [facility] Medicaid benefit. Grace [name] verbally consented to engagement with the [facility] Managed Care team.   For questions related to your [facility] health plan, please call: [phone]  If you would like to schedule transportation through your [facility] plan, please call the following number at least 2 days in advance of your appointment: [phone]  Grace [name] - following are the goals we discussed in your visit today:  Goals Addressed   None       Social Worker will follow up in 5 days .   [name]  Following is a copy of your plan of care:

## 2020-09-20 NOTE — Patient Outreach (Signed)
Medicaid Managed Care Social Work Note  09/20/2020 Name:  Grace [name] MRN:  [id number] DOB:  [birth year]  Grace [name] is an 20 y.o. year old female who is a primary patient of Steele Sizer, MD.  The Medicaid Managed Care Coordination team was consulted for assistance with:  [place] and Resources  Ms. Dlouhy was given information about Medicaid Managed CareCoordination services today. Grace [name] agreed to services and verbal consent obtained.  Engaged with patient  for by telephone forfollow up visit in response to referral for case management and/or care coordination services.   Assessments/Interventions:  Review of past medical history, allergies, medications, health status, including review of consultants reports, laboratory and other test data, was performed as part of comprehensive evaluation and provision of chronic care management services.  SDOH: (Social Determinant of Health) assessments and interventions performed:    Patient stated she had to reschedule her appointment with [facility], when the snow came she tried to reschedule, but they told her they would have to enter it into the system as a no show and she would have to find somewhere else to go. BSW contacted [employer] to check on referral placed on 08/06/20, BSW left a voicemail for a telephone call back. BSW will wait until Friday 09/21/20 for a telephone call back before finding patient somewhere else to go.   Advanced Directives Status:  Not addressed in this encounter.  Care Plan                 No Known Allergies  Medications Reviewed Today    Reviewed by Lane Hacker, [facility] (Pharmacist) on 09/04/20 at 1515  Med List Status: <None>  Medication Order Taking? Sig Documenting Provider Last Dose Status Informant  acetaminophen (TYLENOL) 325 MG tablet [id number] No Take 2 tablets (650 mg total) by mouth every 4 (four) hours as needed (for pain scale < 4).  Patient not taking: Reported on  09/04/2020   Minda Meo, CNM Not Taking Consider Medication Status and Discontinue   albuterol (VENTOLIN HFA) 108 (90 Base) MCG/ACT inhaler [id number] Yes Inhale 2 puffs into the lungs every 6 (six) hours as needed for wheezing or shortness of breath. Marlana Salvage, PA Taking Active   ARIPiprazole (ABILIFY) 5 MG tablet [id number] Yes Take 5 mg by mouth daily. [provider] Taking Active Self  Blood Pressure KIT [id number] Yes 1 kit by Does not apply route daily. Notify provider for blood pressures less than 90/50 or 160/110 or greater Minda Meo, CNM Taking Active   cetirizine (ZYRTEC) 10 MG tablet [id number] Yes Take 1 tablet (10 mg total) by mouth daily. Steele Sizer, MD Taking Active   cyclobenzaprine (FLEXERIL) 10 MG tablet [id number] No Take 1 tablet (10 mg total) by mouth every 8 (eight) hours as needed for muscle spasms.  Patient not taking: Reported on 09/04/2020   Lisette Grinder, CNM Not Taking Consider Medication Status and Discontinue   ibuprofen (ADVIL) 600 MG tablet [id number] No Take 1 tablet (600 mg total) by mouth every 6 (six) hours as needed for mild pain or moderate pain.  Patient not taking: Reported on 09/04/2020   Minda Meo, CNM Not Taking Consider Medication Status and Discontinue   meclizine (ANTIVERT) 25 MG tablet [id number] No   Patient not taking: No sig reported   [provider] Not Taking Consider Medication Status and Discontinue   montelukast (SINGULAIR) 10 MG tablet [id number] No Take 1 tablet (10 mg total) by  mouth at bedtime.  Patient not taking: Reported on 09/04/2020   Steele Sizer, MD Not Taking Consider Medication Status and Discontinue   NIFEdipine (ADALAT CC) 60 MG 24 hr tablet [id number] No Take 1 tablet (60 mg total) by mouth daily.  Patient not taking: Reported on 09/04/2020   Minda Meo, CNM Not Taking Active   polyethylene glycol powder (GLYCOLAX/MIRALAX) 17 GM/SCOOP powder [id number] Yes Take 17 g by mouth daily.   Patient taking differently: Take 17 g by mouth daily. As needed.   Steele Sizer, MD Taking Active   Prenatal Vit-Fe Fumarate-FA (PRENATAL MULTIVITAMIN) TABS tablet [id number] Yes Take 1 tablet by mouth daily at 12 noon. Steele Sizer, MD Taking Active   sertraline (ZOLOFT) 100 MG tablet [id number] Yes Take 100 mg by mouth daily. [provider] Taking Active Self  Spacer/Aero-Holding Donavan Burnet [employer] [place]) [place] [id number] No See admin instructions. use with inhaler  Patient not taking: Reported on 09/04/2020   [provider] Not Taking Consider Medication Status and Discontinue Self  STIMULANT LAXATIVE 8.6-50 MG tablet [id number] No Take 2 tablets by mouth at bedtime as needed.  Patient not taking: Reported on 09/04/2020   [provider] Not Taking Consider Medication Status and Discontinue   Med List Note [name], [facility] 11/04/19 7564): Pt has been out of most meds for a few weeks          Patient Active Problem List   Diagnosis Date Noted  . Elevated blood pressure affecting pregnancy in third trimester, antepartum 07/25/2020  . Labor and delivery indication for care or intervention 07/25/2020  . Supervision of high risk pregnancy in third trimester 01/12/2020  . Mood disorder ([place]) 11/15/2018  . PTSD (post-traumatic stress disorder) 11/15/2018  . Class 1 obesity due to excess calories without serious comorbidity with body mass index (BMI) of 30.0 to 30.9 in adult 11/15/2018  . Neutropenia ([place]) 08/06/2018  . Major depression, recurrent, chronic ([place]) 08/06/2018  . GAD (generalized anxiety disorder) 08/06/2018  . Panic attack 01/22/2016  . Chronic allergic rhinitis 12/20/2015  . Asthma, moderate persistent, poorly-controlled 12/20/2015    Conditions to be addressed/monitored per PCP order:  Anxiety and Depression  Care Plan : General Social Work (Adult)  Updates made by Ethelda Chick since 09/20/2020 12:00 AM     Problem: Symptoms (Depression)     Long-Range Goal: Symptoms Monitored and Managed   Start Date: 07/25/2020  Expected End Date: 10/23/2020  Recent Progress: On track  Priority: High  Note:   Current Barriers:  . Chronic Mental Health needs related to depression and anxiety and past trauma experiences. . Financial constraints related to unemployment. . Transportation . Substance abuse issues -  Patient reports she smoked marijuana until she was about 4 months pregnant. She also smoked cigarettes until about 3 or 4 weeks ago. She now vapes. Leodis Liverpool knowledge of community resource: Patient was receiving outpatient therapy at [facility], but her services were terminated because they do not accept her Medicaid plan.  . Suicidal Ideation/Homicidal Ideation: No  Clinical Social Work Goal(s):  Marland Kitchen Over the next 90 days, patient will work with SW monthly by telephone or in person to reduce or manage symptoms related to depression, anxiety and past trauma. . Over the next 60 days, patient will work with SW to address concerns related to depression and anxiety . Over the next 60 days, patient will work with BSW to address needs related to scheduling appointment with appropriate outpatient  providers for follow up.   Interventions: . Patient interviewed and appropriate assessments performed: PHQ 2 and PHQ 9 . Patient interviewed and appropriate assessments performed . Provided mental health counseling with regard to depression and being a young mother. Update 08/29/2020: Patient reported that "everything is stressing her." She denied SI/HI/AVH. LCSW counseled patient about being a new parent and pacing herself with tasks she has set for herself, and cautioned against feeling like a failure if she doesn't do everything in the exact order she would like. LCSW discussed coping skills and interventions for her to utilize whenever she feels stressed. . Provided patient with information about caring for  self and utilizing support system after childbirth. . Discussed plans with patient for ongoing care management follow up and provided patient with direct contact information for care management team . Collaborated with RN Case Manager re: outpatient therapy and medication management needs . Collaborated with BSW re: scheduling appointment with [facility] [phone]. . Brief CBT, Emotional/Supportive Counseling, Psychoeducation and/or Health Education, and Referred to counselor/psychotherapist . BSW contacted [facility] to make a new patient appointment. BSW was informed that a referral has to be submitted. BSW will send referral. Referral sent on 08/06/20. Marland Kitchen Update 08/29/2020: Patient stated she has not received a call from [employer] to schedule appointments. LCSW will collaborate with BSW regarding scheduling. LCSW will consider referring to another agency if unable to get patient scheduled [facility] [phone] or [phone]). . BSW contacted [facility] and scheduled patient for an appointment on 09/13/20 at 2pm.  . BSW contacted patient to inform her of her therapy appointment on 09/13/20 at 2pm. Patient stated she would go to the appointment and asked for an email for a reminder and for the address. Patient's email [email or web address]. Marland Kitchen Update 09/20/20: Patient stated she had to reschedule her appointment with [facility], when the snow came she tried to reschedule, but they told her they would have to enter it into the system as a no show and she would have to find somewhere else to go. BSW contacted [employer] to check on referral placed on 08/06/20, BSW left a voicemail for a telephone call back. BSW will wait until Friday 09/21/20 for a telephone call back before finding patient somewhere else to go.  Patient Self Care Activities:  . Performs ADL's  independently . Performs IADL's independently . Ability for insight . Motivation for treatment . Strong family or social support  Patient Self Care Deficits:  . Does not attend all scheduled provider appointments . Former marijuana use . Update 08/29/2020: Patient is a young, new mother. She was recently diagnosed with Covid after giving birth; she had mild symptoms and she had been vaccinated. Her newborn son was also diagnosed with Covid and was recently discharged on 08/28/2020. She has started back attending college as well, and she plans to go back to work (she has an interview today).      Follow up:  Patient agrees to Care Plan and Follow-up.  Plan: The Managed Medicaid care management team will reach out to the patient again over the next 5 days.  Date/time of next scheduled Social Work care management/care coordination outreach:  09/27/20  Mickel Fuchs, [place], [place] Medicaid Team

## 2020-09-21 ENCOUNTER — Other Ambulatory Visit: Payer: Self-pay

## 2020-09-21 NOTE — Patient Outreach (Signed)
Patient compliant on meds, will be delivered on 09/26/20.   Scheduled f/u for 23 days after delivery date.  Called Specialist and asked PCP for refills to [facility]

## 2020-09-26 NOTE — Progress Notes (Deleted)
Name: MAKAYIA DUPLESSIS   MRN: [id number]    DOB: 27-Jun-2001   Date:09/26/2020       Progress Note  Subjective  Chief Complaint  Medication Refill  HPI    Patient Active Problem List   Diagnosis Date Noted  . Elevated blood pressure affecting pregnancy in third trimester, antepartum 07/25/2020  . Labor and delivery indication for care or intervention 07/25/2020  . Supervision of high risk pregnancy in third trimester 01/12/2020  . Mood disorder ([place]) 11/15/2018  . PTSD (post-traumatic stress disorder) 11/15/2018  . Class 1 obesity due to excess calories without serious comorbidity with body mass index (BMI) of 30.0 to 30.9 in adult 11/15/2018  . Neutropenia ([place]) 08/06/2018  . Major depression, recurrent, chronic ([place]) 08/06/2018  . GAD (generalized anxiety disorder) 08/06/2018  . Panic attack 01/22/2016  . Chronic allergic rhinitis 12/20/2015  . Asthma, moderate persistent, poorly-controlled 12/20/2015    Past Surgical History:  Procedure Laterality Date  . LAPAROSCOPY  05/07/2017   Procedure: LAPAROSCOPY DIAGNOSTIC;  Surgeon: Ouida Sills Gwen Her, MD;  Location: [facility] ORS;  Service: Gynecology;;    Family History  Problem Relation Age of Onset  . Asthma Father   . Hypertension Father   . ADD / ADHD Brother     Social History   Tobacco Use  . Smoking status: Former Smoker    Packs/day: 0.25    Years: 2.00    Pack years: 0.50    Start date: 03/01/2017    Quit date: 07/11/2020    Years since quitting: 0.2  . Smokeless tobacco: Never Used  . Tobacco comment: using nicotene patches at the moment  Substance Use Topics  . Alcohol use: No    Alcohol/week: 0.0 standard drinks     Current Outpatient Medications:  .  acetaminophen (TYLENOL) 325 MG tablet, Take 2 tablets (650 mg total) by mouth every 4 (four) hours as needed (for pain scale < 4). (Patient not taking: Reported on 09/04/2020), Disp: , Rfl:  .  albuterol (VENTOLIN HFA) 108 (90 Base) MCG/ACT inhaler, Inhale 2  puffs into the lungs every 6 (six) hours as needed for wheezing or shortness of breath., Disp: 8 g, Rfl: 2 .  ARIPiprazole (ABILIFY) 5 MG tablet, Take 5 mg by mouth daily., Disp: , Rfl:  .  Blood Pressure KIT, 1 kit by Does not apply route daily. Notify provider for blood pressures less than 90/50 or 160/110 or greater, Disp: 1 kit, Rfl: 0 .  cetirizine (ZYRTEC) 10 MG tablet, Take 1 tablet (10 mg total) by mouth daily., Disp: 90 tablet, Rfl: 1 .  cyclobenzaprine (FLEXERIL) 10 MG tablet, Take 1 tablet (10 mg total) by mouth every 8 (eight) hours as needed for muscle spasms. (Patient not taking: Reported on 09/04/2020), Disp: 30 tablet, Rfl: 0 .  ibuprofen (ADVIL) 600 MG tablet, Take 1 tablet (600 mg total) by mouth every 6 (six) hours as needed for mild pain or moderate pain. (Patient not taking: Reported on 09/04/2020), Disp: 60 tablet, Rfl: 1 .  meclizine (ANTIVERT) 25 MG tablet, , Disp: , Rfl:  .  montelukast (SINGULAIR) 10 MG tablet, Take 1 tablet (10 mg total) by mouth at bedtime. (Patient not taking: Reported on 09/04/2020), Disp: 90 tablet, Rfl: 1 .  NIFEdipine (ADALAT CC) 60 MG 24 hr tablet, Take 1 tablet (60 mg total) by mouth daily. (Patient not taking: Reported on 09/04/2020), Disp: 30 tablet, Rfl: 1 .  polyethylene glycol powder (GLYCOLAX/MIRALAX) 17 GM/SCOOP powder, Take 17 g by  mouth daily. (Patient taking differently: Take 17 g by mouth daily. As needed.), Disp: 3350 g, Rfl: 1 .  Prenatal Vit-Fe Fumarate-FA (PRENATAL MULTIVITAMIN) TABS tablet, Take 1 tablet by mouth daily at 12 noon., Disp: 100 tablet, Rfl: 1 .  sertraline (ZOLOFT) 100 MG tablet, Take 100 mg by mouth daily., Disp: , Rfl:  .  Spacer/Aero-Holding Chambers ([place]) DEVI, See admin instructions. use with inhaler (Patient not taking: Reported on 09/04/2020), Disp: , Rfl: 0 .  STIMULANT LAXATIVE 8.6-50 MG tablet, Take 2 tablets by mouth at bedtime as needed. (Patient not taking: Reported on 09/04/2020), Disp: ,  Rfl:   No Known Allergies  I personally reviewed {Reviewed:14835} with the patient/caregiver today.   ROS  ***  Objective  There were no vitals filed for this visit.  There is no height or weight on file to calculate BMI.  Physical Exam ***  Recent Results (from the past 2160 hour(s))  OB RESULTS CONSOLE GC/Chlamydia     Status: None   Collection Time: 07/01/20 12:00 AM  Result Value Ref Range   Gonorrhea Negative    Chlamydia Negative   Urinalysis, Routine w reflex microscopic Urine, Clean Catch     Status: Abnormal   Collection Time: 07/01/20  5:05 PM  Result Value Ref Range   Color, Urine YELLOW (A) YELLOW   APPearance HAZY (A) CLEAR   Specific Gravity, Urine 1.011 1.005 - 1.030   pH 7.0 5.0 - 8.0   Glucose, UA NEGATIVE NEGATIVE mg/dL   Hgb urine dipstick NEGATIVE NEGATIVE   Bilirubin Urine NEGATIVE NEGATIVE   Ketones, ur NEGATIVE NEGATIVE mg/dL   Protein, ur NEGATIVE NEGATIVE mg/dL   Nitrite NEGATIVE NEGATIVE   Leukocytes,Ua NEGATIVE NEGATIVE    Comment: Performed at [facility], [address] [place], [place] [address]  Wet prep, genital     Status: Abnormal   Collection Time: 07/01/20  5:50 PM  Result Value Ref Range   Yeast Wet Prep HPF POC PRESENT (A) NONE SEEN   Trich, Wet Prep NONE SEEN NONE SEEN   Clue Cells Wet Prep HPF POC NONE SEEN NONE SEEN   WBC, Wet Prep HPF POC MODERATE (A) NONE SEEN   Sperm NONE SEEN     Comment: Performed at [facility], [address] [place], [place] [address]  [place] rt PCR [facility] only)     Status: None   Collection Time: 07/01/20  5:50 PM   Specimen: Cervical/Vaginal swab  Result Value Ref Range   Specimen source GC/Chlam ENDOCERVICAL    Chlamydia Tr NOT DETECTED NOT DETECTED   N gonorrhoeae NOT DETECTED NOT DETECTED    Comment: (NOTE) This CT/NG assay has not been evaluated in patients with a history of  hysterectomy. Performed at [facility], [address]  [place],  [address]   Urinalysis, Complete w Microscopic Urine, Clean Catch     Status: Abnormal   Collection Time: 07/25/20  6:07 PM  Result Value Ref Range   Color, Urine STRAW (A) YELLOW   APPearance CLEAR (A) CLEAR   Specific Gravity, Urine 1.005 1.005 - 1.030   pH 7.0 5.0 - 8.0   Glucose, UA NEGATIVE NEGATIVE mg/dL   Hgb urine dipstick NEGATIVE NEGATIVE   Bilirubin Urine NEGATIVE NEGATIVE   Ketones, ur NEGATIVE NEGATIVE mg/dL   Protein, ur NEGATIVE NEGATIVE mg/dL   Nitrite NEGATIVE NEGATIVE   Leukocytes,Ua TRACE (A) NEGATIVE   RBC / HPF 0-5 0 - 5 RBC/hpf  WBC, UA 0-5 0 - 5 WBC/hpf   Bacteria, UA RARE (A) NONE SEEN   Squamous Epithelial / LPF 0-5 0 - 5    Comment: Performed at [facility], [address] [place], [place] [address]  Protein / creatinine ratio, urine     Status: None   Collection Time: 07/25/20  6:07 PM  Result Value Ref Range   Creatinine, Urine 54 mg/dL   Total Protein, Urine <6 mg/dL    Comment: NO NORMAL RANGE ESTABLISHED FOR THIS TEST   Protein Creatinine Ratio        0.00 - 0.15 mg/mg[Cre]    Comment: RESULT BELOW REPORTABLE RANGE, UNABLE TO CALCULATE. Performed at [facility], [address] [place], [place] [address]   Resp Panel by RT-PCR (Flu A&B, Covid) Nasopharyngeal Swab     Status: None   Collection Time: 07/25/20  6:07 PM   Specimen: Nasopharyngeal Swab; Nasopharyngeal(NP) swabs in vial transport medium  Result Value Ref Range   SARS Coronavirus 2 by RT PCR NEGATIVE NEGATIVE    Comment: (NOTE) SARS-CoV-2 target nucleic acids are NOT DETECTED.  The SARS-CoV-2 RNA is generally detectable in upper respiratory specimens during the acute phase of infection. The lowest concentration of SARS-CoV-2 viral copies this assay can detect is 138 copies/mL. A negative result does not preclude SARS-Cov-2 infection and should not be used as the sole basis for treatment or other patient management decisions. A negative result may  occur with  improper specimen collection/handling, submission of specimen other than nasopharyngeal swab, presence of viral mutation(s) within the areas targeted by this assay, and inadequate number of viral copies(<138 copies/mL). A negative result must be combined with clinical observations, patient history, and epidemiological information. The expected result is Negative.  Fact Sheet for Patients:  EntrepreneurPulse.com.au  Fact Sheet for Healthcare Providers:  IncredibleEmployment.be  This test is no t yet approved or cleared by the [place] FDA and  has been authorized for detection and/or diagnosis of SARS-CoV-2 by FDA under an Emergency Use Authorization (EUA). This EUA will remain  in effect (meaning this test can be used) for the duration of the COVID-19 declaration under Section 564(b)(1) of the Act, 21 U.S.C.section 360bbb-3(b)(1), unless the authorization is terminated  or revoked sooner.       Influenza A by PCR NEGATIVE NEGATIVE   Influenza B by PCR NEGATIVE NEGATIVE    Comment: (NOTE) The Xpert Xpress SARS-CoV-2/FLU/RSV plus assay is intended as an aid in the diagnosis of influenza from Nasopharyngeal swab specimens and should not be used as a sole basis for treatment. Nasal washings and aspirates are unacceptable for Xpert Xpress SARS-CoV-2/FLU/RSV testing.  Fact Sheet for Patients: EntrepreneurPulse.com.au  Fact Sheet for Healthcare Providers: IncredibleEmployment.be  This test is not yet approved or cleared by the [place] FDA and has been authorized for detection and/or diagnosis of SARS-CoV-2 by FDA under an Emergency Use Authorization (EUA). This EUA will remain in effect (meaning this test can be used) for the duration of the COVID-19 declaration under Section 564(b)(1) of the Act, 21 U.S.C. section 360bbb-3(b)(1), unless the authorization is terminated  or revoked.  Performed at [facility], [address] [place], [place] [address]   Group B strep by PCR     Status: None   Collection Time: 07/25/20  6:07 PM   Specimen: Vaginal/Rectal; Genital  Result Value Ref Range   Group B strep by PCR NEGATIVE NEGATIVE    Comment: (NOTE) Intrapartum testing with Xpert  GBS assay should be used as an adjunct to other methods available and not used to replace antepartum testing (at 35-[redacted] weeks gestation). Performed at [facility], [address] [place], [place] [address]   [place] rt PCR [facility] only)     Status: None   Collection Time: 07/25/20  6:07 PM   Specimen: Vaginal/Rectal  Result Value Ref Range   Specimen source GC/Chlam CTNG    Chlamydia Tr NOT DETECTED NOT DETECTED   N gonorrhoeae NOT DETECTED NOT DETECTED    Comment: (NOTE) This CT/NG assay has not been evaluated in patients with a history of  hysterectomy. Performed at [facility], [address] [place], [place] [address]   Sample to Blood Bank     Status: None   Collection Time: 07/25/20  6:34 PM  Result Value Ref Range   Blood Bank Specimen SAMPLE AVAILABLE FOR TESTING    Sample Expiration      07/28/2020,2359 Performed at [facility], [address] [place], [place] [address]   CBC     Status: Abnormal   Collection Time: 07/25/20  6:36 PM  Result Value Ref Range   WBC 6.0 4.0 - 10.5 K/uL   RBC 3.88 3.87 - 5.11 MIL/uL   Hemoglobin 11.1 (L) 12.0 - 15.0 g/dL   HCT 33.6 (L) 36.0 - 46.0 %   MCV 86.6 80.0 - 100.0 fL   MCH 28.6 26.0 - 34.0 pg   MCHC 33.0 30.0 - 36.0 g/dL   RDW 13.7 11.5 - 15.5 %   Platelets 245 150 - 400 K/uL   nRBC 0.0 0.0 - 0.2 %    Comment: Performed at [facility], [address] [place], [place] [address]  Comprehensive metabolic panel     Status: Abnormal   Collection Time: 07/25/20  6:36 PM  Result Value Ref Range   Sodium 136 135 - 145 mmol/L   Potassium 4.1 3.5 - 5.1 mmol/L    Chloride 108 98 - 111 mmol/L   CO2 21 (L) 22 - 32 mmol/L   Glucose, Bld 81 70 - 99 mg/dL    Comment: Glucose reference range applies only to samples taken after fasting for at least 8 hours.   BUN 6 6 - 20 mg/dL   Creatinine, Ser 0.66 0.44 - 1.00 mg/dL   Calcium 8.6 (L) 8.9 - 10.3 mg/dL   Total Protein 6.8 6.5 - 8.1 g/dL   Albumin 3.1 (L) 3.5 - 5.0 g/dL   AST 16 15 - 41 U/L   ALT 11 0 - 44 U/L   Alkaline Phosphatase 209 (H) 38 - 126 U/L   Total Bilirubin 0.6 0.3 - 1.2 mg/dL   GFR, Estimated >60 >60 mL/min    Comment: (NOTE) Calculated using the CKD-EPI Creatinine Equation (2021)    Anion gap 7 5 - 15    Comment: Performed at [facility], [address] [place], [place] [address]  RPR     Status: None   Collection Time: 07/25/20  6:36 PM  Result Value Ref Range   RPR Ser Ql NON REACTIVE NON REACTIVE    Comment: Performed at [facility] [address], [place], [place] [address]  Urine Drug Screen, Qualitative ([place] only)     Status: None   Collection Time: 07/26/20  8:05 AM  Result Value Ref Range   Tricyclic, Ur Screen NONE DETECTED NONE DETECTED   Amphetamines, Ur Screen NONE DETECTED NONE DETECTED   MDMA (Ecstasy)Ur  Screen NONE DETECTED NONE DETECTED   Cocaine Metabolite,Ur [place] NONE DETECTED NONE DETECTED   Opiate, Ur Screen NONE DETECTED NONE DETECTED   Phencyclidine (PCP) Ur S NONE DETECTED NONE DETECTED   Cannabinoid 50 Ng, Ur [place] NONE DETECTED NONE DETECTED   Barbiturates, Ur Screen NONE DETECTED NONE DETECTED   Benzodiazepine, Ur Scrn NONE DETECTED NONE DETECTED   Methadone Scn, Ur NONE DETECTED NONE DETECTED    Comment: (NOTE) Tricyclics + metabolites, urine    Cutoff 1000 ng/mL Amphetamines + metabolites, urine  Cutoff 1000 ng/mL MDMA (Ecstasy), urine              Cutoff 500 ng/mL Cocaine Metabolite, urine          Cutoff 300 ng/mL Opiate + metabolites, urine        Cutoff 300 ng/mL Phencyclidine (PCP), urine         Cutoff 25  ng/mL Cannabinoid, urine                 Cutoff 50 ng/mL Barbiturates + metabolites, urine  Cutoff 200 ng/mL Benzodiazepine, urine              Cutoff 200 ng/mL Methadone, urine                   Cutoff 300 ng/mL  The urine drug screen provides only a preliminary, unconfirmed analytical test result and should not be used for non-medical purposes. Clinical consideration and professional judgment should be applied to any positive drug screen result due to possible interfering substances. A more specific alternate chemical method must be used in order to obtain a confirmed analytical result. Gas chromatography / mass spectrometry (GC/MS) is the preferred confirm atory method. Performed at [facility], [address] [place], [place] [address]   CBC     Status: Abnormal   Collection Time: 07/27/20  6:27 AM  Result Value Ref Range   WBC 11.8 (H) 4.0 - 10.5 K/uL   RBC 3.46 (L) 3.87 - 5.11 MIL/uL   Hemoglobin 10.2 (L) 12.0 - 15.0 g/dL   HCT 29.8 (L) 36.0 - 46.0 %   MCV 86.1 80.0 - 100.0 fL   MCH 29.5 26.0 - 34.0 pg   MCHC 34.2 30.0 - 36.0 g/dL   RDW 13.7 11.5 - 15.5 %   Platelets 205 150 - 400 K/uL   nRBC 0.0 0.0 - 0.2 %    Comment: Performed at [facility], [address], [place], [place] [address]  Comprehensive metabolic panel     Status: Abnormal   Collection Time: 07/27/20  6:27 AM  Result Value Ref Range   Sodium 137 135 - 145 mmol/L   Potassium 3.6 3.5 - 5.1 mmol/L   Chloride 107 98 - 111 mmol/L   CO2 21 (L) 22 - 32 mmol/L   Glucose, Bld 106 (H) 70 - 99 mg/dL    Comment: Glucose reference range applies only to samples taken after fasting for at least 8 hours.   BUN <5 (L) 6 - 20 mg/dL   Creatinine, Ser 0.68 0.44 - 1.00 mg/dL   Calcium 8.8 (L) 8.9 - 10.3 mg/dL   Total Protein 5.5 (L) 6.5 - 8.1 g/dL   Albumin 2.6 (L) 3.5 - 5.0 g/dL   AST 21 15 - 41 U/L   ALT 10 0 - 44 U/L   Alkaline Phosphatase 148 (H) 38 - 126 U/L   Total Bilirubin 0.7 0.3 - 1.2  mg/dL   GFR,  Estimated >60 >60 mL/min    Comment: (NOTE) Calculated using the CKD-EPI Creatinine Equation (2021)    Anion gap 9 5 - 15    Comment: Performed at [facility], [address] [place], [place] [address]    Diabetic Foot Exam: Diabetic Foot Exam - Simple   No data filed    ***  PHQ2/9: Depression screen [facility] 2/9 08/29/2020 07/25/2020 06/26/2020 06/12/2020 11/23/2019  Decreased Interest 2 3 2 2  0  Down, Depressed, Hopeless 2 2 2 2 2   PHQ - 2 Score 4 5 4 4 2   Altered sleeping 1 3 1 1  0  Tired, decreased energy 1 3 1 1  0  Change in appetite 1 3 1 1  0  Feeling bad or failure about yourself  1 3 3 3  0  Trouble concentrating 2 1 1 1 1   Moving slowly or fidgety/restless 2 1 1 1 1   Suicidal thoughts 2 0 0 0 0  PHQ-9 Score 14 19 12 12 4   Difficult doing work/chores Extremely dIfficult Somewhat difficult Extremely dIfficult Extremely dIfficult Very difficult  Some recent data might be hidden    phq 9 is {gen pos [id number]} ***  Fall Risk: Fall Risk  06/26/2020 06/12/2020 11/23/2019 09/06/2019 09/02/2019  Falls in the past year? 0 0 0 0 0  Number falls in past yr: 0 0 0 0 0  Injury with Fall? 0 0 0 0 0  Follow up - - - Falls evaluation completed Falls evaluation completed   ***   Functional Status Survey:   ***   Assessment & Plan  *** There are no diagnoses linked to this encounter.

## 2020-09-27 ENCOUNTER — Other Ambulatory Visit: Payer: Self-pay

## 2020-09-27 ENCOUNTER — Ambulatory Visit: Payer: Medicaid Other | Admitting: Family Medicine

## 2020-09-27 ENCOUNTER — Encounter: Payer: Self-pay | Admitting: Family Medicine

## 2020-09-27 NOTE — Patient Instructions (Signed)
Visit Information  Grace [name] was given information about Medicaid Managed Care team care coordination services as a part of their [facility] Medicaid benefit. Grace [name] verbally consented to engagement with the [facility] Managed Care team.   Again please contact [facility] and schedule your appointment [phone] For questions related to your [facility] health plan, please call: [phone]  If you would like to schedule transportation through your [facility] plan, please call the following number at least 2 days in advance of your appointment: [phone]  Grace [name] - following are the goals we discussed in your visit today:  Goals Addressed   None       Social Worker will follow up in 14 days.   Grace [name]  Following is a copy of your plan of care:

## 2020-09-27 NOTE — Patient Outreach (Signed)
Medicaid Managed Care Social Work Note  09/27/2020 Name:  Grace [name] MRN:  [id number] DOB:  12/11/2000  Grace [name] is an 20 y.o. year old female who is a primary patient of Steele Sizer, MD.  The Medicaid Managed Care Coordination team was consulted for assistance with:  [place] and Resources  [name] was given information about Medicaid Managed CareCoordination services today. Grace [name] agreed to services and verbal consent obtained.  Engaged with patient  for by telephone forfollow up visit in response to referral for case management and/or care coordination services.   Assessments/Interventions:  Review of past medical history, allergies, medications, health status, including review of consultants reports, laboratory and other test data, was performed as part of comprehensive evaluation and provision of chronic care management services.  SDOH: (Social Determinant of Health) assessments and interventions performed:   Advanced Directives Status:  See Vynca application for related entries.  Care Plan                 No Known Allergies  Medications Reviewed Today    Reviewed by Lane Hacker, [facility] (Pharmacist) on 09/04/20 at 1515  Med List Status: <None>  Medication Order Taking? Sig Documenting Provider Last Dose Status Informant  acetaminophen (TYLENOL) 325 MG tablet [id number] No Take 2 tablets (650 mg total) by mouth every 4 (four) hours as needed (for pain scale < 4).  Patient not taking: Reported on 09/04/2020   Minda Meo, CNM Not Taking Consider Medication Status and Discontinue   albuterol (VENTOLIN HFA) 108 (90 Base) MCG/ACT inhaler [id number] Yes Inhale 2 puffs into the lungs every 6 (six) hours as needed for wheezing or shortness of breath. Marlana Salvage, PA Taking Active   ARIPiprazole (ABILIFY) 5 MG tablet [id number] Yes Take 5 mg by mouth daily. [provider] Taking Active Self  Blood Pressure KIT [id number] Yes 1 kit by  Does not apply route daily. Notify provider for blood pressures less than 90/50 or 160/110 or greater Minda Meo, CNM Taking Active   cetirizine (ZYRTEC) 10 MG tablet [id number] Yes Take 1 tablet (10 mg total) by mouth daily. Steele Sizer, MD Taking Active   cyclobenzaprine (FLEXERIL) 10 MG tablet [id number] No Take 1 tablet (10 mg total) by mouth every 8 (eight) hours as needed for muscle spasms.  Patient not taking: Reported on 09/04/2020   Lisette Grinder, CNM Not Taking Consider Medication Status and Discontinue   ibuprofen (ADVIL) 600 MG tablet [id number] No Take 1 tablet (600 mg total) by mouth every 6 (six) hours as needed for mild pain or moderate pain.  Patient not taking: Reported on 09/04/2020   Minda Meo, CNM Not Taking Consider Medication Status and Discontinue   meclizine (ANTIVERT) 25 MG tablet [id number] No   Patient not taking: No sig reported   [provider] Not Taking Consider Medication Status and Discontinue   montelukast (SINGULAIR) 10 MG tablet [id number] No Take 1 tablet (10 mg total) by mouth at bedtime.  Patient not taking: Reported on 09/04/2020   Steele Sizer, MD Not Taking Consider Medication Status and Discontinue   NIFEdipine (ADALAT CC) 60 MG 24 hr tablet [id number] No Take 1 tablet (60 mg total) by mouth daily.  Patient not taking: Reported on 09/04/2020   Minda Meo, CNM Not Taking Active   polyethylene glycol powder (GLYCOLAX/MIRALAX) 17 GM/SCOOP powder [id number] Yes Take 17 g by mouth daily.  Patient taking differently: Take 17 g by mouth  daily. As needed.   Steele Sizer, MD Taking Active   Prenatal Vit-Fe Fumarate-FA (PRENATAL MULTIVITAMIN) TABS tablet [id number] Yes Take 1 tablet by mouth daily at 12 noon. Steele Sizer, MD Taking Active   sertraline (ZOLOFT) 100 MG tablet [id number] Yes Take 100 mg by mouth daily. [provider] Taking Active Self  Spacer/Aero-Holding Donavan Burnet Sarya-[employer] [place]) [place] [id number]  No See admin instructions. use with inhaler  Patient not taking: Reported on 09/04/2020   [provider] Not Taking Consider Medication Status and Discontinue Self  STIMULANT LAXATIVE 8.6-50 MG tablet [id number] No Take 2 tablets by mouth at bedtime as needed.  Patient not taking: Reported on 09/04/2020   [provider] Not Taking Consider Medication Status and Discontinue   Med List Note [name], [facility] 11/04/19 1856): Pt has been out of most meds for a few weeks          Patient Active Problem List   Diagnosis Date Noted  . Elevated blood pressure affecting pregnancy in third trimester, antepartum 07/25/2020  . Labor and delivery indication for care or intervention 07/25/2020  . Supervision of high risk pregnancy in third trimester 01/12/2020  . Mood disorder ([place]) 11/15/2018  . PTSD (post-traumatic stress disorder) 11/15/2018  . Class 1 obesity due to excess calories without serious comorbidity with body mass index (BMI) of 30.0 to 30.9 in adult 11/15/2018  . Neutropenia ([place]) 08/06/2018  . Major depression, recurrent, chronic ([place]) 08/06/2018  . GAD (generalized anxiety disorder) 08/06/2018  . Leukopenia 11/11/2016  . Allergy to nuts 09/15/2016  . Panic attack 01/22/2016  . Chronic allergic rhinitis 12/20/2015  . Asthma, moderate persistent, poorly-controlled 12/20/2015  . History of depression 12/20/2015  . Primary dysmenorrhea 12/20/2015    Conditions to be addressed/monitored per PCP order:  Depression  Care Plan : General Social Work (Adult)  Updates made by Ethelda Chick since 09/27/2020 12:00 AM    Problem: Symptoms (Depression)     Long-Range Goal: Symptoms Monitored and Managed   Start Date: 07/25/2020  Expected End Date: 10/23/2020  Recent Progress: On track  Priority: High  Note:   Current Barriers:  . Chronic Mental Health needs related to depression and anxiety and past trauma experiences. . Financial constraints related to  unemployment. . Transportation . Substance abuse issues -  Patient reports she smoked marijuana until she was about 4 months pregnant. She also smoked cigarettes until about 3 or 4 weeks ago. She now vapes. Grace [place] knowledge of community resource: Patient was receiving outpatient therapy at [facility], but her services were terminated because they do not accept her Medicaid plan.  . Suicidal Ideation/Homicidal Ideation: No  Clinical Social Work Goal(s):  Marland Kitchen Over the next 90 days, patient will work with SW monthly by telephone or in person to reduce or manage symptoms related to depression, anxiety and past trauma. . Over the next 60 days, patient will work with SW to address concerns related to depression and anxiety . Over the next 60 days, patient will work with BSW to address needs related to scheduling appointment with appropriate outpatient providers for follow up.   Interventions: . Patient interviewed and appropriate assessments performed: PHQ 2 and PHQ 9 . Patient interviewed and appropriate assessments performed . Provided mental health counseling with regard to depression and being a young mother. Update 08/29/2020: Patient reported that "everything is stressing her." She denied SI/HI/AVH. LCSW counseled patient about being a new parent and pacing herself with tasks she  has set for herself, and cautioned against feeling like a failure if she doesn't do everything in the exact order she would like. LCSW discussed coping skills and interventions for her to utilize whenever she feels stressed. . Provided patient with information about caring for self and utilizing support system after childbirth. . Discussed plans with patient for ongoing care management follow up and provided patient with direct contact information for care management team . Collaborated with RN Case Manager re: outpatient therapy and medication management needs . Collaborated with BSW re: scheduling appointment with  [facility] [phone]. . Brief CBT, Emotional/Supportive Counseling, Psychoeducation and/or Health Education, and Referred to counselor/psychotherapist . BSW contacted [facility] to make a new patient appointment. BSW was informed that a referral has to be submitted. BSW will send referral. Referral sent on 08/06/20. Marland Kitchen Update 08/29/2020: Patient stated she has not received a call from [facility] to schedule appointments. LCSW will collaborate with BSW regarding scheduling. LCSW will consider referring to another agency if unable to get patient scheduled [facility] [phone] or [phone]). . BSW contacted [facility] and scheduled patient for an appointment on 09/13/20 at 2pm.  . BSW contacted patient to inform her of her therapy appointment on 09/13/20 at 2pm. Patient stated she would go to the appointment and asked for an email for a reminder and for the address. Patient's email [email or web address]. Marland Kitchen Update 09/20/20: Patient stated she had to reschedule her appointment with [facility], when the snow came she tried to reschedule, but they told her they would have to enter it into the system as a no show and she would have to find somewhere else to go. BSW contacted [facility] to check on referral placed on 08/06/20, BSW left a voicemail for a telephone call back. BSW will wait until Friday 09/21/20 for a telephone call back before finding patient somewhere else to go. Marland Kitchen Update 09/27/20: BSW still has not received a telephone call from [place], [place] called and left another voicemail. BSW contacted [facility], but they do not take patient's insurance. BSW contacted [facility] [phone] and was not able to leave a voicemail. BSW contacted [facility] [phone] and left a voicemail, BSW contacted [facility] [phone], however they do  not accept patient's insurance. Patient stated she would like to go somewhere in [facility] but if she has to go outside of [place] then that is ok.  . BSW contacted [place] [phone] rep stated she missed her initial appointments and can no longer be a patient at any of the offices. . BSW provieded patient with [facility] telephone number [phone]- [phone] to schedule an appointment. Patient Self Care Activities:  . Performs ADL's independently . Performs IADL's independently . Ability for insight . Motivation for treatment . Strong family or social support  Patient Self Care Deficits:  . Does not attend all scheduled provider appointments . Former marijuana use . Update 08/29/2020: Patient is a young, new mother. She was recently diagnosed with Covid after giving birth; she had mild symptoms and she had been vaccinated. Her newborn son was also diagnosed with Covid and was recently discharged on 08/28/2020. She has started back attending college as well, and she plans to go back to work (she has an interview today).      Follow up:  Patient agrees to Care Plan and Follow-up.  Plan: The Managed Medicaid care management team will reach out  to the patient again over the next 14 days.  Date/time of next scheduled Social Work care management/care coordination outreach:  10/17/20  Mickel Fuchs, [place], [place] Medicaid Team

## 2020-09-28 ENCOUNTER — Other Ambulatory Visit: Payer: Self-pay

## 2020-09-28 ENCOUNTER — Other Ambulatory Visit: Payer: Self-pay | Admitting: Obstetrics and Gynecology

## 2020-09-28 NOTE — Patient Instructions (Signed)
Hi Ms. Hickling, thank you for speaking with me today.  Ms. Leon was given information about Medicaid Managed Care team care coordination services as a part of their [facility] Medicaid benefit. NEDDA GAINS verbally consented to engagement with the [facility] Managed Care team.   For questions related to your [facility] health plan, please call: [phone]  If you would like to schedule transportation through your [facility] plan, please call the following number at least 2 days in advance of your appointment: [phone]  Ms. Leamy - following are the goals we discussed in your visit today:  Goals Addressed            This Visit's Progress   . Protect My Health       Timeframe:  Long-Range Goal Priority:  High Start Date:      09/28/20                       Expected End Date:        12/26/20               Follow Up Date 10/26/20   - schedule appointment for vaccines needed due to my age or health - schedule recommended health tests (blood work, mammogram, colonoscopy, pap test) - schedule and keep appointment for annual check-up       Patient verbalizes understanding of instructions provided today.   The Managed Medicaid care management team will reach out to the patient again over the next 30 days.  The patient has been provided with contact information for the Managed Medicaid care management team and has been advised to call with any health related questions or concerns.   Kathi Der RN, BSN [place]  [employer], production - Managed Medicaid High Risk [phone].  Following is a copy of your plan of care:  Patient Care Plan: General Plan of Care (Adult)    Problem Identified: Health Promotion or Disease Self-Management (General Plan of Care)     Goal: Self-Management Plan Developed   Start Date: 07/23/2020  Expected End Date: 10/22/2020  Recent Progress: Not on track  Priority: High  Note:   Current Barriers:  . Patient  delivered in December.  Postpartum visit rescheduled to 10/05/20.  Patient with elevated blood pressures-140's over 90's per patient with slight headache.  Nurse Case Manager Clinical Goal(s):  Marland Kitchen Over the next 30 days, patient will verbalize understanding of plan for transportation needs. Marland Kitchen Update 09/28/20:  Patient states she is not having any transportation issues right now. . Over the next 30 days, patient will work with medical provider  to address needs related to ongoing pelvic pain . Update 09/28/20:  Patient states pelvic pain is better and she doesn't feel like she needs PT right now. . Over the next 30 days, patient will attend all scheduled medical appointments:  Appointment 10/05/20 for post partum visit.  Interventions:  . Inter-disciplinary care team collaboration (see longitudinal plan of care) . Evaluation of current treatment plan and patient's adherence to plan as established by provider. . Advised patient to contact medical provider's office with increasing BP for evaluation. . Reviewed medications with patient. . Discussed plans with patient for ongoing care management follow up and provided patient with direct contact information for care management team . Reviewed scheduled/upcoming provider appointments.   Patient Goals/Self-Care Activities Over the next 30 days, patient will:  -Patient will attend all scheduled appointments.   Follow Up Plan:  The Managed Medicaid care management team will reach out to the patient again over the next  30 days.  The patient has been provided with contact information for the Managed Medicaid care management team and has been advised to call with any health related questions or concerns.

## 2020-09-28 NOTE — Patient Outreach (Signed)
Medicaid Managed Care   Nurse Care Manager Note  09/28/2020 Name:  GENIENE LIST MRN:  [id number] DOB:  [birth year]  Grace [name] is an 20 y.o. year old female who is a primary patient of Steele Sizer, MD.  [facility] Managed Care Coordination team was consulted for assistance with:    asthma, depression, elevated blood pressure.  Ms. Falconi was given information about Medicaid Managed Care Coordination team services today. Grace [name] agreed to services and verbal consent obtained.  Engaged with patient by telephone for follow up visit in response to provider referral for case management and/or care coordination services.   Assessments/Interventions:  Review of past medical history, allergies, medications, health status, including review of consultants reports, laboratory and other test data, was performed as part of comprehensive evaluation and provision of chronic care management services.  SDOH (Social Determinants of Health) assessments and interventions performed:   Care Plan  No Known Allergies  Medications Reviewed Today    Reviewed by Gayla Medicus, RN (Registered Nurse) on 09/28/20 at 1045  Med List Status: <None>  Medication Order Taking? Sig Documenting Provider Last Dose Status Informant  acetaminophen (TYLENOL) 325 MG tablet [id number] Yes Take 2 tablets (650 mg total) by mouth every 4 (four) hours as needed (for pain scale < 4). Minda Meo, CNM Taking Active   albuterol (VENTOLIN HFA) 108 (90 Base) MCG/ACT inhaler [id number] Yes Inhale 2 puffs into the lungs every 6 (six) hours as needed for wheezing or shortness of breath. Marlana Salvage, PA Taking Active   ARIPiprazole (ABILIFY) 5 MG tablet [id number] Yes Take 5 mg by mouth daily. [provider] Taking Active Self  Blood Pressure KIT [id number] Yes 1 kit by Does not apply route daily. Notify provider for blood pressures less than 90/50 or 160/110 or greater Minda Meo, CNM Taking Active    cetirizine (ZYRTEC) 10 MG tablet [id number] Yes Take 1 tablet (10 mg total) by mouth daily. Steele Sizer, MD Taking Active   cyclobenzaprine (FLEXERIL) 10 MG tablet [id number] No Take 1 tablet (10 mg total) by mouth every 8 (eight) hours as needed for muscle spasms.  Patient not taking: No sig reported   Lisette Grinder, CNM Not Taking Active   famotidine (PEPCID) 40 MG tablet [id number] No Take by mouth.  Patient not taking: Reported on 09/28/2020   [provider] Not Taking Active   ibuprofen (ADVIL) 600 MG tablet [id number] No Take 1 tablet (600 mg total) by mouth every 6 (six) hours as needed for mild pain or moderate pain.  Patient not taking: Reported on 09/28/2020   Minda Meo, CNM Not Taking Active   meclizine (ANTIVERT) 25 MG tablet [id number] No   Patient not taking: No sig reported   [provider] Not Taking Active   medroxyPROGESTERone (DEPO-PROVERA) 150 MG/ML injection [id number] Yes Inject into the muscle. [provider] Taking Active   montelukast (SINGULAIR) 10 MG tablet [id number] Yes Take 1 tablet (10 mg total) by mouth at bedtime. Steele Sizer, MD Taking Active   NIFEdipine (ADALAT CC) 60 MG 24 hr tablet [id number] No Take 1 tablet (60 mg total) by mouth daily.  Patient not taking: No sig reported   Minda Meo, CNM Not Taking Active   polyethylene glycol powder (GLYCOLAX/MIRALAX) 17 GM/SCOOP powder [id number] Yes Take 17 g by mouth daily.  Patient taking differently: Take 17 g by mouth daily. As needed.   Steele Sizer, MD Taking Active   Prenatal  Vit-Fe Fumarate-FA (PRENATAL MULTIVITAMIN) TABS tablet [id number] Yes Take 1 tablet by mouth daily at 12 noon. Alba Cory, MD Taking Active   sertraline (ZOLOFT) 100 MG tablet [id number] Yes Take 100 mg by mouth daily. [provider] Taking Active Self  Spacer/Aero-Holding Eula Listen Jahlisa-[employer] [place]) [place] [id number] No See admin instructions. use with inhaler  Patient  not taking: No sig reported   [provider] Not Taking Active   STIMULANT LAXATIVE 8.6-50 MG tablet [id number] No Take 2 tablets by mouth at bedtime as needed.  Patient not taking: No sig reported   [provider] Not Taking Active   Med List Note Albina Billet, [facility] 11/04/19 6418): Pt has been out of most meds for a few weeks          Patient Active Problem List   Diagnosis Date Noted  . Elevated blood pressure affecting pregnancy in third trimester, antepartum 07/25/2020  . Labor and delivery indication for care or intervention 07/25/2020  . Supervision of high risk pregnancy in third trimester 01/12/2020  . Mood disorder (H[facility]) 11/15/2018  . PTSD (post-traumatic stress disorder) 11/15/2018  . Class 1 obesity due to excess calories without serious comorbidity with body mass index (BMI) of 30.0 to 30.9 in adult 11/15/2018  . Neutropenia (HCC) 08/06/2018  . Major depression, recurrent, chronic (HCC) 08/06/2018  . GAD (generalized anxiety disorder) 08/06/2018  . Leukopenia 11/11/2016  . Allergy to nuts 09/15/2016  . Panic attack 01/22/2016  . Chronic allergic rhinitis 12/20/2015  . Asthma, moderate persistent, poorly-controlled 12/20/2015  . History of depression 12/20/2015  . Primary dysmenorrhea 12/20/2015    Conditions to be addressed/monitored per PCP order:  asthma, depression, elevated blood pressure  Care Plan : General Plan of Care (Adult)  Updates made by Danie Chandler, RN since 09/28/2020 12:00 AM    Problem: Health Promotion or Disease Self-Management (General Plan of Care)     Goal: Self-Management Plan Developed   Start Date: 07/23/2020  Expected End Date: 10/22/2020  Recent Progress: Not on track  Priority: High  Note:   Current Barriers:  . Patient delivered in December.  Postpartum visit rescheduled to 10/05/20.  Patient with elevated blood pressures-140's over 90's per patient with slight headache.  Nurse Case Manager Clinical  Goal(s):  Marland Kitchen Over the next 30 days, patient will verbalize understanding of plan for transportation needs. Marland Kitchen Update 09/28/20:  Patient states she is not having any transportation issues right now. . Over the next 30 days, patient will work with medical provider  to address needs related to ongoing pelvic pain . Update 09/28/20:  Patient states pelvic pain is better and she doesn't feel like she needs PT right now. . Over the next 30 days, patient will attend all scheduled medical appointments:  Appointment 10/05/20 for post partum visit.  Interventions:  . Inter-disciplinary care team collaboration (see longitudinal plan of care) . Evaluation of current treatment plan and patient's adherence to plan as established by provider. . Advised patient to contact medical provider's office with increasing BP for evaluation. . Reviewed medications with patient. . Discussed plans with patient for ongoing care management follow up and provided patient with direct contact information for care management team . Reviewed scheduled/upcoming provider appointments.  Patient Goals/Self-Care Activities Over the next 30 days, patient will:  -Patient will attend all scheduled appointments.  Follow Up Plan:   The Managed Medicaid care management team will reach out to the patient again over the next  30 days.  The patient has been provided with contact information for the Managed Medicaid care management team and has been advised to call with any health related questions or concerns.     Follow Up:  Patient agrees to Care Plan and Follow-up.  Plan: The Managed Medicaid care management team will reach out to the patient again over the next 30 days. and The patient has been provided with contact information for the Managed Medicaid care management team and has been advised to call with any health related questions or concerns.  Date/time of next scheduled RN care management/care coordination outreach:  10/25/20 at  330.

## 2020-10-15 NOTE — Patient Outreach (Signed)
Patient compliant on meds, all refills scheduled as delivered for  10/25/20 Albuterol Inhaler No        Aripiprazole 5mg  No        Cetirizine 10mg  No        Cyclobenzaprine 10mg  No      What is on file is a 10 day supply           Ibuprofen 600mg  No        Meclizine 25mg  No      What is on file is a 20 day supply  Medroxyprogesterone 150mg  No        Montelukast 10mg  No        Nifedipine ER 60mg  No        Sertraline 100mg  No         F/U 30 days

## 2020-10-17 ENCOUNTER — Other Ambulatory Visit: Payer: Self-pay

## 2020-10-17 NOTE — Patient Instructions (Signed)
Visit Information  Ms. [name]  - as a part of your Medicaid benefit, you are eligible for care management and care coordination services at no cost or copay. I was unable to reach you by phone today but would be happy to help you with your health related needs. Please feel free to call me @ [phone].   A member of the Managed Medicaid care management team will reach out to you again over the next 30 days.   Gus Puma, BSW, [facility]  [place]  High Risk Managed Medicaid Team

## 2020-10-17 NOTE — Patient Outreach (Signed)
Care Coordination  10/17/2020  [name] 24-Nov-2000 [id number]   Medicaid Managed Care   Unsuccessful Outreach Note  10/17/2020 Name: Grace [name] MRN: [id number] DOB: [birth year]  Referred by: Alba Cory, MD Reason for referral : High Risk Managed Medicaid (HR MM Unsuccessful Telephone Outreach)   An unsuccessful telephone outreach was attempted today. The patient was referred to the case management team for assistance with care management and care coordination.   Follow Up Plan: The care management team will reach out to the patient again over the next 30 days.   Gus Puma, BSW, [facility]  [place]  High Risk Managed Medicaid Team

## 2020-10-18 ENCOUNTER — Other Ambulatory Visit: Payer: Self-pay

## 2020-10-25 ENCOUNTER — Other Ambulatory Visit: Payer: Self-pay | Admitting: Obstetrics and Gynecology

## 2020-10-25 ENCOUNTER — Other Ambulatory Visit: Payer: Self-pay

## 2020-10-25 NOTE — Patient Outreach (Signed)
Medicaid Managed Care   Nurse Care Manager Note  10/25/2020 Name:  Grace [name] MRN:  [id number] DOB:  [birth year]  Grace [name] is an 20 y.o. year old female who is a primary patient of Grace Sizer, MD.  The [facility] Managed Care Coordination team was consulted for assistance with:    case management follow up.  [name] was given information about Medicaid Managed Care Coordination team services today. Grace [name] agreed to services and verbal consent obtained.  Engaged with patient by telephone for follow up visit in response to provider referral for case management and/or care coordination services.   Assessments/Interventions:  Review of past medical history, allergies, medications, health status, including review of consultants reports, laboratory and other test data, was performed as part of comprehensive evaluation and provision of chronic care management services.  SDOH (Social Determinants of Health) assessments and interventions performed:   Care Plan  No Known Allergies  Medications Reviewed Today    Reviewed by Grace Medicus, RN (Registered Nurse) on 10/25/20 at 1542  Med List Status: <None>  Medication Order Taking? Sig Documenting Provider Last Dose Status Informant  acetaminophen (TYLENOL) 325 MG tablet [id number] Yes Take 2 tablets (650 mg total) by mouth every 4 (four) hours as needed (for pain scale < 4). Grace [name], CNM Taking Active   albuterol (VENTOLIN HFA) 108 (90 Base) MCG/ACT inhaler [id number] Yes Inhale 2 puffs into the lungs every 6 (six) hours as needed for wheezing or shortness of breath. Grace Salvage, PA Taking Active   ARIPiprazole (ABILIFY) 5 MG tablet [id number] Yes Take 5 mg by mouth daily. [provider] Taking Active Self  Blood Pressure KIT [id number] No 1 kit by Does not apply route daily. Notify provider for blood pressures less than 90/50 or 160/110 or greater  Patient not taking: Reported on 10/25/2020   Grace [name],  CNM Not Taking Active   cetirizine (ZYRTEC) 10 MG tablet [id number] Yes Take 1 tablet (10 mg total) by mouth daily. Grace Sizer, MD Taking Active   cyclobenzaprine (FLEXERIL) 10 MG tablet [id number] No Take 1 tablet (10 mg total) by mouth every 8 (eight) hours as needed for muscle spasms.  Patient not taking: No sig reported   Grace [name], CNM Not Taking Active   famotidine (PEPCID) 40 MG tablet [id number] No Take by mouth.  Patient not taking: No sig reported   [provider] Not Taking Active   ibuprofen (ADVIL) 600 MG tablet [id number] No Take 1 tablet (600 mg total) by mouth every 6 (six) hours as needed for mild pain or moderate pain.  Patient not taking: No sig reported   Grace [name], CNM Not Taking Active   meclizine (ANTIVERT) 25 MG tablet [id number] No   Patient not taking: No sig reported   [provider] Not Taking Active   medroxyPROGESTERone (DEPO-PROVERA) 150 MG/ML injection [id number] Yes Inject into the muscle. [provider] Taking Active   montelukast (SINGULAIR) 10 MG tablet [id number] Yes Take 1 tablet (10 mg total) by mouth at bedtime. Grace Sizer, MD Taking Active   NIFEdipine (ADALAT CC) 60 MG 24 hr tablet [id number] No Take 1 tablet (60 mg total) by mouth daily.  Patient not taking: No sig reported   Grace [name], CNM Not Taking Active   polyethylene glycol powder (GLYCOLAX/MIRALAX) 17 GM/SCOOP powder [id number] Yes Take 17 g by mouth daily.  Patient taking differently: Take 17 g by mouth daily. As needed.  Grace Sizer, MD Taking Active   Prenatal Vit-Fe Fumarate-FA (PRENATAL MULTIVITAMIN) TABS tablet [id number] Yes Take 1 tablet by mouth daily at 12 noon. Grace Sizer, MD Taking Active   sertraline (ZOLOFT) 100 MG tablet [id number] Yes Take 100 mg by mouth daily. [provider] Taking Active Self  Spacer/Aero-Holding Grace [name]) [place] [id number] No See admin instructions. use with  inhaler  Patient not taking: No sig reported   [provider] Not Taking Active   STIMULANT LAXATIVE 8.6-50 MG tablet [id number] No Take 2 tablets by mouth at bedtime as needed.  Patient not taking: No sig reported   [provider] Not Taking Active   Med List Note Grace [name], [facility] 11/04/19 6546): Pt has been out of most meds for a few weeks          Patient Active Problem List   Diagnosis Date Noted  . Elevated blood pressure affecting pregnancy in third trimester, antepartum 07/25/2020  . Labor and delivery indication for care or intervention 07/25/2020  . Supervision of high risk pregnancy in third trimester 01/12/2020  . Mood disorder ([place]) 11/15/2018  . PTSD (post-traumatic stress disorder) 11/15/2018  . Class 1 obesity due to excess calories without serious comorbidity with body mass index (BMI) of 30.0 to 30.9 in adult 11/15/2018  . Neutropenia ([place]) 08/06/2018  . Major depression, recurrent, chronic ([place]) 08/06/2018  . GAD (generalized anxiety disorder) 08/06/2018  . Leukopenia 11/11/2016  . Allergy to nuts 09/15/2016  . Panic attack 01/22/2016  . Chronic allergic rhinitis 12/20/2015  . Asthma, moderate persistent, poorly-controlled 12/20/2015  . History of depression 12/20/2015  . Primary dysmenorrhea 12/20/2015    Conditions to be addressed/monitored per PCP order:  case management follow up, anxiety, depression, HTN, asthma.  Care Plan : General Plan of Care (Adult)  Updates made by Grace Medicus, RN since 10/25/2020 12:00 AM    Problem: Health Promotion or Disease Self-Management (General Plan of Care)     Long-Range Goal: Self-Management Plan Developed   Start Date: 07/23/2020  Expected End Date: 01/25/2021  Recent Progress: Not on track  Priority: High  Note:   Current Barriers:  . Patient delivered in December.  Postpartum visit rescheduled to 10/05/20.  Patient with elevated blood pressures-140's over 90's per patient with slight  headache.  Patient also with wheezing-needs to follow up with PCP.  Nurse Case Manager Clinical Goal(s):  Grace [name] Kitchen Over the next 30 days, patient will verbalize understanding of plan for transportation needs. Grace [name] Kitchen Update 09/28/20:  Patient states she is not having any transportation issues right now. . Over the next 30 days, patient will work with medical provider  to address needs related to ongoing pelvic pain . Update 09/28/20:  Patient states pelvic pain is better and she doesn't feel like she needs PT right now. . Over the next 30 days, patient will attend all scheduled medical appointments:  Appointment 10/05/20 for post partum visit. Grace [name] Kitchen Update 10/25/20:  Patient unable to attend appointment 10/05/20-encouraged to reschedule asap.  Interventions:  . Inter-disciplinary care team collaboration (see longitudinal plan of care) . Evaluation of current treatment plan and patient's adherence to plan as established by provider. . Advised patient to contact medical provider's office with increasing BP for evaluation. . Reviewed medications with patient. . Discussed plans with patient for ongoing care management follow up and provided patient with direct contact information for care management team . Reviewed scheduled/upcoming provider appointments. . SW referral for social issues.  Patient  Goals/Self-Care Activities Over the next 30 days, patient will:  -Patient will attend all scheduled appointments.  Follow Up Plan:   The Managed Medicaid care management team will reach out to the patient again over the next  30 days.  The patient has been provided with contact information for the Managed Medicaid care management team and has been advised to call with any health related questions or concerns.     Follow Up:  Patient agrees to Care Plan and Follow-up.  Plan: The Managed Medicaid care management team will reach out to the patient again over the next 14 days. and The patient has been provided with contact  information for the Managed Medicaid care management team and has been advised to call with any health related questions or concerns.  Date/time of next scheduled RN care management/care coordination outreach:  11/06/20 at 230.

## 2020-10-25 NOTE — Patient Instructions (Signed)
Hi Grace [name], thank you for speaking with me today.  Grace [name] was given information about Medicaid Managed Care team care coordination services as a part of their [facility] Medicaid benefit. Grace [name] verbally consented to engagement with the [facility] Managed Care team.   For questions related to your [facility] health plan, please call: [phone]  If you would like to schedule transportation through your [facility] plan, please call the following number at least 2 days in advance of your appointment: [phone]   Grace [name] - following are the goals we discussed in your visit today:  Goals Addressed            This Visit's Progress   . Protect My Health       Timeframe:  Long-Range Goal Priority:  High Start Date:      09/28/20                       Expected End Date:        12/26/20               Follow Up Date 11/08/20   - schedule appointment for vaccines needed due to my age or health - schedule recommended health tests (blood work, mammogram, colonoscopy, pap test) - schedule and keep appointment for annual check-up  Update 10/25/20:  Patient needs to schedule post partum visit and PCP visit.    Patient verbalizes understanding of instructions provided today.   The Managed Medicaid care management team will reach out to the patient again over the next 14 days.  The patient has been provided with contact information for the Managed Medicaid care management team and has been advised to call with any health related questions or concerns.   Grace Der RN, BSN [place]  [employer] - Managed Medicaid High Risk [phone].  Following is a copy of your plan of care:  Patient Care Plan: General Plan of Care (Adult)    Problem Identified: Health Promotion or Disease Self-Management (General Plan of Care)     Long-Range Goal: Self-Management Plan Developed   Start Date: 07/23/2020  Expected End Date: 01/25/2021   Recent Progress: Not on track  Priority: High  Note:   Current Barriers:  . Patient delivered in December.  Postpartum visit rescheduled to 10/05/20.  Patient with elevated blood pressures-140's over 90's per patient with slight headache.  Patient also with wheezing-needs to follow up with PCP.  Nurse Case Manager Clinical Goal(s):  Grace [name] Over the next 30 days, patient will verbalize understanding of plan for transportation needs. Grace [name] Update 09/28/20:  Patient states she is not having any transportation issues right now. . Over the next 30 days, patient will work with medical provider  to address needs related to ongoing pelvic pain . Update 09/28/20:  Patient states pelvic pain is better and she doesn't feel like she needs PT right now. . Over the next 30 days, patient will attend all scheduled medical appointments:  Appointment 10/05/20 for post partum visit. Grace [name] Update 10/25/20:  Patient unable to attend appointment 10/05/20-encouraged to reschedule asap.  Interventions:  . Inter-disciplinary care team collaboration (see longitudinal plan of care) . Evaluation of current treatment plan and patient's adherence to plan as established by provider. . Advised patient to contact medical provider's office with increasing BP for evaluation. . Reviewed medications with patient. . Discussed plans with patient for ongoing care management follow up and provided patient with direct  contact information for care management team . Reviewed scheduled/upcoming provider appointments. . SW referral for social issues.  Patient Goals/Self-Care Activities Over the next 30 days, patient will:  -Patient will attend all scheduled appointments.  Follow Up Plan:   The Managed Medicaid care management team will reach out to the patient again over the next  14  days.  The patient has been provided with contact information for the Managed Medicaid care management team and has been advised to call with any health related  questions or concerns.

## 2020-11-06 ENCOUNTER — Other Ambulatory Visit: Payer: Self-pay | Admitting: Obstetrics and Gynecology

## 2020-11-06 NOTE — Patient Outreach (Signed)
Care Coordination  11/06/2020  Grace [name] August 10, 2001 [id number]    Medicaid Managed Care   Unsuccessful Outreach Note  11/06/2020 Name: Grace [name] MRN: [id number] DOB: 2000/11/23  Referred by: Alba Cory, MD Reason for referral : High Risk Managed Medicaid (Unsuccessful telephone outreach)   An unsuccessful telephone outreach was attempted today. The patient was referred to the case management team for assistance with care management and care coordination.   Follow Up Plan: A member of the Managed Medicaid care management team will reach out to the patient again over the next 7 days.   Kathi Der RN, BSN [place]  [employer] - Managed Medicaid High Risk [phone].

## 2020-11-06 NOTE — Patient Instructions (Signed)
Hi Ms. Faeth, I am sorry I missed you today  - as a part of your Medicaid benefit, you are eligible for care management and care coordination services at no cost or copay. I was unable to reach you by phone today but would be happy to help you with your health related needs. Please feel free to call me at [phone].  A member of the Managed Medicaid care management team will reach out to you again over the next 7 days.   Kathi Der RN, BSN [place]  [employer] - Managed Medicaid High Risk [phone].

## 2020-11-12 ENCOUNTER — Other Ambulatory Visit: Payer: Self-pay | Admitting: Obstetrics and Gynecology

## 2020-11-12 ENCOUNTER — Other Ambulatory Visit: Payer: Self-pay

## 2020-11-12 NOTE — Patient Outreach (Signed)
Medicaid Managed Care   Nurse Care Manager Note  11/12/2020 Name:  Grace [name] MRN:  [id number] DOB:  August 06, 2001  Grace [name] is an 20 y.o. year old female who is a primary patient of Steele Sizer, MD.  The [facility] Managed Care Coordination team was consulted for assistance with:    Obstetrics healthcare management needs/follow up.  Grace [name] was given information about Medicaid Managed Care Coordination team services today. Grace [name] agreed to services and verbal consent obtained.  Engaged with patient by telephone for follow up visit in response to provider referral for case management and/or care coordination services.   Assessments/Interventions:  Review of past medical history, allergies, medications, health status, including review of consultants reports, laboratory and other test data, was performed as part of comprehensive evaluation and provision of chronic care management services.  SDOH (Social Determinants of Health) assessments and interventions performed:   Care Plan  No Known Allergies  Medications Reviewed Today    Reviewed by Gayla Medicus, RN (Registered Nurse) on 11/12/20 at 1609  Med List Status: <None>  Medication Order Taking? Sig Documenting Provider Last Dose Status Informant  acetaminophen (TYLENOL) 325 MG tablet [id number] Yes Take 2 tablets (650 mg total) by mouth every 4 (four) hours as needed (for pain scale < 4). Minda Meo, CNM Taking Active   albuterol (VENTOLIN HFA) 108 (90 Base) MCG/ACT inhaler [id number] Yes Inhale 2 puffs into the lungs every 6 (six) hours as needed for wheezing or shortness of breath. Marlana Salvage, PA Taking Active   ARIPiprazole (ABILIFY) 5 MG tablet [id number] Yes Take 5 mg by mouth daily. [provider] Taking Active Self  Blood Pressure KIT [id number] No 1 kit by Does not apply route daily. Notify provider for blood pressures less than 90/50 or 160/110 or greater  Patient not taking: No sig reported    Minda Meo, CNM Not Taking Active   cetirizine (ZYRTEC) 10 MG tablet [id number] Yes Take 1 tablet (10 mg total) by mouth daily. Steele Sizer, MD Taking Active   cyclobenzaprine (FLEXERIL) 10 MG tablet [id number] No Take 1 tablet (10 mg total) by mouth every 8 (eight) hours as needed for muscle spasms.  Patient not taking: No sig reported   Lisette Grinder, CNM Not Taking Active   famotidine (PEPCID) 40 MG tablet [id number] No Take by mouth.  Patient not taking: No sig reported   [provider] Not Taking Active   ibuprofen (ADVIL) 600 MG tablet [id number] No Take 1 tablet (600 mg total) by mouth every 6 (six) hours as needed for mild pain or moderate pain.  Patient not taking: No sig reported   Minda Meo, CNM Not Taking Active   meclizine (ANTIVERT) 25 MG tablet [id number] No   Patient not taking: No sig reported   [provider] Not Taking Active   medroxyPROGESTERone (DEPO-PROVERA) 150 MG/ML injection [id number] Yes Inject into the muscle. [provider] Taking Active   montelukast (SINGULAIR) 10 MG tablet [id number] Yes Take 1 tablet (10 mg total) by mouth at bedtime. Steele Sizer, MD Taking Active   NIFEdipine (ADALAT CC) 60 MG 24 hr tablet [id number] Yes Take 1 tablet (60 mg total) by mouth daily. Minda Meo, CNM Taking Active   polyethylene glycol powder (GLYCOLAX/MIRALAX) 17 GM/SCOOP powder [id number] No Take 17 g by mouth daily.  Patient not taking: Reported on 11/12/2020   Steele Sizer, MD Not Taking Active   Prenatal Vit-Fe Fumarate-FA (PRENATAL MULTIVITAMIN)  TABS tablet [id number] Yes Take 1 tablet by mouth daily at 12 noon. Steele Sizer, MD Taking Active   sertraline (ZOLOFT) 100 MG tablet [id number] Yes Take 100 mg by mouth daily. [provider] Taking Active Self  Spacer/Aero-Holding Donavan Burnet [employer] [place]) [place] [id number] No See admin instructions. use with inhaler  Patient not taking: No sig reported    [provider] Not Taking Active   STIMULANT LAXATIVE 8.6-50 MG tablet [id number] No Take 2 tablets by mouth at bedtime as needed.  Patient not taking: No sig reported   [provider] Not Taking Active   Med List Note [name], [facility] 11/04/19 0240): Pt has been out of most meds for a few weeks          Patient Active Problem List   Diagnosis Date Noted  . Elevated blood pressure affecting pregnancy in third trimester, antepartum 07/25/2020  . Labor and delivery indication for care or intervention 07/25/2020  . Supervision of high risk pregnancy in third trimester 01/12/2020  . Mood disorder ([place]) 11/15/2018  . PTSD (post-traumatic stress disorder) 11/15/2018  . Class 1 obesity due to excess calories without serious comorbidity with body mass index (BMI) of 30.0 to 30.9 in adult 11/15/2018  . Neutropenia ([place]) 08/06/2018  . Major depression, recurrent, chronic ([place]) 08/06/2018  . GAD (generalized anxiety disorder) 08/06/2018  . Leukopenia 11/11/2016  . Allergy to nuts 09/15/2016  . Panic attack 01/22/2016  . Chronic allergic rhinitis 12/20/2015  . Asthma, moderate persistent, poorly-controlled 12/20/2015  . History of depression 12/20/2015  . Primary dysmenorrhea 12/20/2015    Conditions to be addressed/monitored per PCP order:  obstetric healthcare management needs/follow up-HTN, asthma, anxiety, depression  Care Plan : General Plan of Care (Adult)  Updates made by Gayla Medicus, RN since 11/12/2020 12:00 AM    Problem: Health Promotion or Disease Self-Management (General Plan of Care)     Long-Range Goal: Self-Management Plan Developed   Start Date: 07/23/2020  Expected End Date: 01/25/2021  Recent Progress: Not on track  Priority: High  Note:   Current Barriers:  . Patient delivered in December.  Postpartum visit rescheduled to 11/13/20.  Patient with elevated blood pressures-140's over 90's per patient with slight headache.  Patient also with  wheezing-needs to follow up with PCP.  Nurse Case Manager Clinical Goal(s):  Marland Kitchen Over the next 30 days, patient will verbalize understanding of plan for transportation needs. Marland Kitchen Update 09/28/20:  Patient states she is not having any transportation issues right now. . Over the next 30 days, patient will work with medical provider  to address needs related to ongoing pelvic pain . Update 09/28/20:  Patient states pelvic pain is better and she doesn't feel like she needs PT right now. . Over the next 30 days, patient will attend all scheduled medical appointments:  Appointment 10/05/20 for post partum visit. Marland Kitchen Update 10/25/20:  Patient unable to attend appointment 10/05/20-encouraged to reschedule asap.Update 11/12/20:  Patient states she has post partum appointment scheduled for 11/13/20.  Interventions:  . Inter-disciplinary care team collaboration (see longitudinal plan of care) . Evaluation of current treatment plan and patient's adherence to plan as established by provider. . Advised patient to contact medical provider's office with increasing BP for evaluation. . Reviewed medications with patient. . Discussed plans with patient for ongoing care management follow up and provided patient with direct contact information for care management team . Reviewed scheduled/upcoming provider appointments. . SW referral for social issues.  Patient  Goals/Self-Care Activities Over the next 30 days, patient will:  -Patient will attend all scheduled appointments.  Follow Up Plan:   The Managed Medicaid care management team will reach out to the patient again over the next  30 days.  The patient has been provided with contact information for the Managed Medicaid care management team and has been advised to call with any health related questions or concerns.     Follow Up:  Patient agrees to Care Plan and Follow-up.  Plan: The Managed Medicaid care management team will reach out to the patient again over the next  30 days. and The patient has been provided with contact information for the Managed Medicaid care management team and has been advised to call with any health related questions or concerns.  Date/time of next scheduled RN care management/care coordination outreach:  4/21//22 at 330.

## 2020-11-12 NOTE — Patient Instructions (Signed)
Hi Grace [name], thank you for speaking with me today.  Grace [name] was given information about Medicaid Managed Care team care coordination services as a part of their [facility] Medicaid benefit. Grace [name] verbally consented to engagement with the [facility] Managed Care team.   For questions related to your [facility] health plan, please call: [phone]  If you would like to schedule transportation through your [facility] plan, please call the following number at least 2 days in advance of your appointment: [phone]   Call the [facility] Crisis Line at [phone], at any time, 24 hours a day, 7 days a week. If you are in danger or need immediate medical attention call 911.  Grace [name] - following are the goals we discussed in your visit today:  Goals Addressed            This Visit's Progress   . Protect My Health       Timeframe:  Long-Range Goal Priority:  High Start Date:      09/28/20                       Expected End Date:        12/26/20               Follow Up Date 12/09/20   - schedule appointment for vaccines needed due to my age or health - schedule recommended health tests (blood work, mammogram, colonoscopy, pap test) - schedule and keep appointment for annual check-up  Update 10/25/20:  Patient needs to schedule post partum visit and PCP visit. Update 11/12/20:  patient states she has post partum appointment scheduled for 11/13/20.      Patient verbalizes understanding of instructions provided today.   The Managed Medicaid care management team will reach out to the patient again over the next 30 days.  The patient has been provided with contact information for the Managed Medicaid care management team and has been advised to call with any health related questions or concerns.   Kathi Der RN, BSN [place]  Triad Engineer, production - Managed Medicaid High Risk [phone].  Following is a copy of your plan of  care:  Patient Care Plan: General Plan of Care (Adult)    Problem Identified: Health Promotion or Disease Self-Management (General Plan of Care)     Long-Range Goal: Self-Management Plan Developed   Start Date: 07/23/2020  Expected End Date: 01/25/2021  Recent Progress: Not on track  Priority: High  Note:   Current Barriers:  . Patient delivered in December.  Postpartum visit rescheduled to 11/13/20.  Patient with elevated blood pressures-140's over 90's per patient with slight headache.  Patient also with wheezing-needs to follow up with PCP.  Nurse Case Manager Clinical Goal(s):  Marland Kitchen Over the next 30 days, patient will verbalize understanding of plan for transportation needs. Marland Kitchen Update 09/28/20:  Patient states she is not having any transportation issues right now. . Over the next 30 days, patient will work with medical provider  to address needs related to ongoing pelvic pain . Update 09/28/20:  Patient states pelvic pain is better and she doesn't feel like she needs PT right now. . Over the next 30 days, patient will attend all scheduled medical appointments:  Appointment 10/05/20 for post partum visit. Marland Kitchen Update 10/25/20:  Patient unable to attend appointment 10/05/20-encouraged to reschedule asap.Update 11/12/20:  Patient states she has post partum appointment scheduled for 11/13/20.  Interventions:  .  Inter-disciplinary care team collaboration (see longitudinal plan of care) . Evaluation of current treatment plan and patient's adherence to plan as established by provider. . Advised patient to contact medical provider's office with increasing BP for evaluation. . Reviewed medications with patient. . Discussed plans with patient for ongoing care management follow up and provided patient with direct contact information for care management team . Reviewed scheduled/upcoming provider appointments. . SW referral for social issues.  Patient Goals/Self-Care Activities Over the next 30 days, patient  will:  -Patient will attend all scheduled appointments.  Follow Up Plan:   The Managed Medicaid care management team will reach out to the patient again over the next  30 days.  The patient has been provided with contact information for the Managed Medicaid care management team and has been advised to call with any health related questions or concerns.

## 2020-11-14 ENCOUNTER — Other Ambulatory Visit: Payer: Self-pay

## 2020-11-15 NOTE — Patient Outreach (Signed)
Compliant on medications, due for refills on 11/23/20. Out of refills of Amitripyline, Rx'd from OB/GYN. When I called to ask for refill, they denied to due various no-show's. Called patient to explain on 11/14/20 and she asked me to call back the next day.  Spoke with patient on 11/15/20 and she affirmed understanding and will get seen ASAP to get refills.

## 2020-12-13 ENCOUNTER — Other Ambulatory Visit: Payer: Self-pay

## 2020-12-13 ENCOUNTER — Other Ambulatory Visit: Payer: Self-pay | Admitting: Obstetrics and Gynecology

## 2020-12-13 NOTE — Patient Instructions (Signed)
Hi Ms. Laymon, thank you for speaking with me.  Please see if you can schedule an appointment with your provider when you have time.  Thank you.  Ms. Ratliff was given information about Medicaid Managed Care team care coordination services as a part of their [facility] Medicaid benefit. NAKYIAH KUCK verbally consented to engagement with the [facility] Managed Care team.   For questions related to your [facility] health plan, please call: [phone]  If you would like to schedule transportation through your [facility] plan, please call the following number at least 2 days in advance of your appointment: [phone]   Call the [place] at [phone], at any time, 24 hours a day, 7 days a week. If you are in danger or need immediate medical attention call 911.  Ms. Troop - following are the goals we discussed in your visit today:  Goals Addressed            This Visit's Progress   . Protect My Health       Timeframe:  Long-Range Goal Priority:  High Start Date:      09/28/20                       Expected End Date:        12/26/20               Follow Up Date 12/09/20   - schedule appointment for vaccines needed due to my age or health - schedule recommended health tests (blood work, mammogram, colonoscopy, pap test) - schedule and keep appointment for annual check-up  Update 10/25/20:  Patient needs to schedule post partum visit and PCP visit. Update 11/12/20:  patient states she has post partum appointment scheduled for 11/13/20. Update 12/13/20:  patient said she did not attend post partum appointment, encouraged patient to schedule an appointment with a provider.    Patient verbalizes understanding of instructions provided today.   The Managed Medicaid care management team will reach out to the patient again over the next 30 days.  The patient has been provided with contact information for the Managed Medicaid care management team and has been advised to  call with any health related questions or concerns.   Aida Raider RN, BSN [place]  Triad Curator - Managed Medicaid High Risk [phone].  Following is a copy of your plan of care:  Patient Care Plan: General Plan of Care (Adult)    Problem Identified: Health Promotion or Disease Self-Management (General Plan of Care)   Priority: High  Onset Date: 12/13/2020    Long-Range Goal: Self-Management Plan Developed   Start Date: 07/23/2020  Expected End Date: 01/25/2021  Recent Progress: Not on track  Priority: High  Note:   Current Barriers:  . Patient delivered in [place] not had postpartum visit yet.  Patient with elevated blood pressures-140's over 90's per patient with slight headache.  Patient also with wheezing-needs to follow up with PCP.  Nurse Case Manager Clinical Goal(s):  Marland Kitchen Over the next 30 days, patient will verbalize understanding of plan for transportation needs. Marland Kitchen Update 09/28/20:  Patient states she is not having any transportation issues right now. . Over the next 30 days, patient will work with medical provider  to address needs related to ongoing pelvic pain . Update 09/28/20:  Patient states pelvic pain is better and she doesn't feel like she needs PT right now. . Over the next 30  days, patient will attend all scheduled medical appointments:  Appointment 10/05/20 for post partum visit. Marland Kitchen Update 10/25/20:  Patient unable to attend appointment 10/05/20-encouraged to reschedule asap.Update 11/12/20:  Patient states she has post partum appointment scheduled for 11/13/20. Marland Kitchen Update 12/13/20:  Patient did not attend post partum visit, encouraged patient to schedule an appointment with her provider.  Interventions:  . Inter-disciplinary care team collaboration (see longitudinal plan of care) . Evaluation of current treatment plan and patient's adherence to plan as established by provider. . Advised patient to contact medical provider's  office with increasing BP for evaluation. . Reviewed medications with patient. . Discussed plans with patient for ongoing care management follow up and provided patient with direct contact information for care management team . Reviewed scheduled/upcoming provider appointments. . SW referral for social issues. Marland Kitchen Update 12/13/20:  Patient has met with SW.  Patient Goals/Self-Care Activities Over the next 30 days, patient will:  -Patient will attend all scheduled appointments.  Follow Up Plan:   The Managed Medicaid care management team will reach out to the patient again over the next  30 days.  The patient has been provided with contact information for the Managed Medicaid care management team and has been advised to call with any health related questions or concerns.

## 2020-12-13 NOTE — Patient Outreach (Signed)
Medicaid Managed Care   Nurse Care Manager Note  12/13/2020 Name:  [name] MRN:  [id number] DOB:  Jun 23, 2001  Grace [name] is an 20 y.o. year old female who is a primary patient of Steele Sizer, MD.  The [facility] Managed Care Coordination team was consulted for assistance with:    chronic healthcare management needs.  Grace [name] was given information about Medicaid Managed Care Coordination team services today. Grace [name] agreed to services and verbal consent obtained.  Engaged with patient by telephone for follow up visit in response to provider referral for case management and/or care coordination services.   Assessments/Interventions:  Review of past medical history, allergies, medications, health status, including review of consultants reports, laboratory and other test data, was performed as part of comprehensive evaluation and provision of chronic care management services.  SDOH (Social Determinants of Health) assessments and interventions performed:   Care Plan  No Known Allergies  Medications Reviewed Today    Reviewed by Gayla Medicus, RN (Registered Nurse) on 12/13/20 at 1512  Med List Status: <None>  Medication Order Taking? Sig Documenting Provider Last Dose Status Informant  acetaminophen (TYLENOL) 325 MG tablet [id number] Yes Take 2 tablets (650 mg total) by mouth every 4 (four) hours as needed (for pain scale < 4). Minda Meo, CNM Taking Active   albuterol (VENTOLIN HFA) 108 (90 Base) MCG/ACT inhaler [id number] Yes Inhale 2 puffs into the lungs every 6 (six) hours as needed for wheezing or shortness of breath. Marlana Salvage, PA Taking Active   ARIPiprazole (ABILIFY) 5 MG tablet [id number] Yes Take 5 mg by mouth daily. [provider] Taking Active Self  Blood Pressure KIT [id number] No 1 kit by Does not apply route daily. Notify provider for blood pressures less than 90/50 or 160/110 or greater  Patient not taking: No sig reported   Minda Meo, CNM Not Taking Active   cetirizine (ZYRTEC) 10 MG tablet [id number] Yes Take 1 tablet (10 mg total) by mouth daily. Steele Sizer, MD Taking Active   cyclobenzaprine (FLEXERIL) 10 MG tablet [id number] No Take 1 tablet (10 mg total) by mouth every 8 (eight) hours as needed for muscle spasms.  Patient not taking: No sig reported   Lisette Grinder, CNM Not Taking Active   famotidine (PEPCID) 40 MG tablet [id number] No Take by mouth.  Patient not taking: No sig reported   [provider] Not Taking Active   ibuprofen (ADVIL) 600 MG tablet [id number] No Take 1 tablet (600 mg total) by mouth every 6 (six) hours as needed for mild pain or moderate pain.  Patient not taking: No sig reported   Minda Meo, CNM Not Taking Active   meclizine (ANTIVERT) 25 MG tablet [id number] No   Patient not taking: No sig reported   [provider] Not Taking Active   medroxyPROGESTERone (DEPO-PROVERA) 150 MG/ML injection [id number] Yes Inject into the muscle. [provider] Taking Active   montelukast (SINGULAIR) 10 MG tablet [id number] Yes Take 1 tablet (10 mg total) by mouth at bedtime. Steele Sizer, MD Taking Active   NIFEdipine (ADALAT CC) 60 MG 24 hr tablet [id number] Yes Take 1 tablet (60 mg total) by mouth daily. Minda Meo, CNM Taking Active   polyethylene glycol powder (GLYCOLAX/MIRALAX) 17 GM/SCOOP powder [id number] No Take 17 g by mouth daily.  Patient not taking: No sig reported   Steele Sizer, MD Not Taking Active   Prenatal Vit-Fe Fumarate-FA (PRENATAL MULTIVITAMIN) TABS  tablet [id number] Yes Take 1 tablet by mouth daily at 12 noon. Steele Sizer, MD Taking Active   sertraline (ZOLOFT) 100 MG tablet [id number] Yes Take 100 mg by mouth daily. [provider] Taking Active Self  Spacer/Aero-Holding Donavan Burnet [employer] [place]) [place] [id number] No See admin instructions. use with inhaler  Patient not taking: No sig reported   [provider] Not Taking Active   STIMULANT LAXATIVE 8.6-50 MG tablet [id number] No Take 2 tablets by mouth at bedtime as needed.  Patient not taking: No sig reported   [provider] Not Taking Active   Med List Note [name], [facility] 11/04/19 [id number]): Pt has been out of most meds for a few weeks          Patient Active Problem List   Diagnosis Date Noted  . Elevated blood pressure affecting pregnancy in third trimester, antepartum 07/25/2020  . Labor and delivery indication for care or intervention 07/25/2020  . Supervision of high risk pregnancy in third trimester 01/12/2020  . Mood disorder ([place]) 11/15/2018  . PTSD (post-traumatic stress disorder) 11/15/2018  . Class 1 obesity due to excess calories without serious comorbidity with body mass index (BMI) of 30.0 to 30.9 in adult 11/15/2018  . Neutropenia ([place]) 08/06/2018  . Major depression, recurrent, chronic ([place]) 08/06/2018  . GAD (generalized anxiety disorder) 08/06/2018  . Leukopenia 11/11/2016  . Allergy to nuts 09/15/2016  . Panic attack 01/22/2016  . Chronic allergic rhinitis 12/20/2015  . Asthma, moderate persistent, poorly-controlled 12/20/2015  . History of depression 12/20/2015  . Primary dysmenorrhea 12/20/2015    Conditions to be addressed/monitored per PCP order:  chronic healthcare re management needs, asthma, HTN, depression,anxiety.  Care Plan : General Plan of Care (Adult)  Updates made by Gayla Medicus, RN since 12/13/2020 12:00 AM    Problem: Health Promotion or Disease Self-Management (General Plan of Care)   Priority: High  Onset Date: 12/13/2020    Long-Range Goal: Self-Management Plan Developed   Start Date: 07/23/2020  Expected End Date: 01/25/2021  Recent Progress: Not on track  Priority: High  Note:   Current Barriers:  . Patient delivered in [place] not had postpartum visit yet.  Patient with elevated blood pressures-140's over 90's per patient with slight headache.   Patient also with wheezing-needs to follow up with PCP.  Nurse Case Manager Clinical Goal(s):  Marland Kitchen Over the next 30 days, patient will verbalize understanding of plan for transportation needs. Marland Kitchen Update 09/28/20:  Patient states she is not having any transportation issues right now. . Over the next 30 days, patient will work with medical provider  to address needs related to ongoing pelvic pain . Update 09/28/20:  Patient states pelvic pain is better and she doesn't feel like she needs PT right now. . Over the next 30 days, patient will attend all scheduled medical appointments:  Appointment 10/05/20 for post partum visit. Marland Kitchen Update 10/25/20:  Patient unable to attend appointment 10/05/20-encouraged to reschedule asap.Update 11/12/20:  Patient states she has post partum appointment scheduled for 11/13/20. Marland Kitchen Update 12/13/20:  Patient did not attend post partum visit, encouraged patient to schedule an appointment with her provider.  Interventions:  . Inter-disciplinary care team collaboration (see longitudinal plan of care) . Evaluation of current treatment plan and patient's adherence to plan as established by provider. . Advised patient to contact medical provider's office with increasing BP for evaluation. . Reviewed medications with patient. . Discussed plans with patient for ongoing care management follow  up and provided patient with direct contact information for care management team . Reviewed scheduled/upcoming provider appointments. . SW referral for social issues. Marland Kitchen Update 12/13/20:  Patient has met with SW.  Patient Goals/Self-Care Activities Over the next 30 days, patient will:  -Patient will attend all scheduled appointments.  Follow Up Plan:   The Managed Medicaid care management team will reach out to the patient again over the next  30 days.  The patient has been provided with contact information for the Managed Medicaid care management team and has been advised to call with any health  related questions or concerns.       Follow Up:  Patient agrees to Care Plan and Follow-up.  Plan: The Managed Medicaid care management team will reach out to the patient again over the next 30 days. and The patient has been provided with contact information for the Managed Medicaid care management team and has been advised to call with any health related questions or concerns.  Date/time of next scheduled RN care management/care coordination outreach:  01/07/21 at 130.

## 2020-12-14 ENCOUNTER — Other Ambulatory Visit: Payer: Self-pay

## 2020-12-14 NOTE — Patient Outreach (Signed)
Refills scheduled for 12/24/20   Medication BB B L D BT PCP Refill Needed? Notes  Aripiprazole 5mg       Yes Have requested from [facility] via [facility]  Cetirizine 10mg       No   Ibuprofen 600mg       Yes Have requested from [facility] via [facility]  Montelukast 10mg       No   Nifedipine Er 60mg       Yes Have requested from via [facility]      No   Sertraline 100mg       No             F/u 1 month

## 2020-12-21 NOTE — Patient Outreach (Signed)
Aripiprazole, Nifedipine, and IBU requested from [facility]

## 2021-01-03 NOTE — Progress Notes (Deleted)
Name: Grace [name]   MRN: [id number]    DOB: 2001/03/30   Date:01/03/2021       Progress Note  Subjective  Chief Complaint  Follow up/[place]  HPI  *** Patient Active Problem List   Diagnosis Date Noted  . Elevated blood pressure affecting pregnancy in third trimester, antepartum 07/25/2020  . Labor and delivery indication for care or intervention 07/25/2020  . Supervision of high risk pregnancy in third trimester 01/12/2020  . Mood disorder ([place]) 11/15/2018  . PTSD (post-traumatic stress disorder) 11/15/2018  . Class 1 obesity due to excess calories without serious comorbidity with body mass index (BMI) of 30.0 to 30.9 in adult 11/15/2018  . Neutropenia ([place]) 08/06/2018  . Major depression, recurrent, chronic ([place]) 08/06/2018  . GAD (generalized anxiety disorder) 08/06/2018  . Leukopenia 11/11/2016  . Allergy to nuts 09/15/2016  . Panic attack 01/22/2016  . Chronic allergic rhinitis 12/20/2015  . Asthma, moderate persistent, poorly-controlled 12/20/2015  . History of depression 12/20/2015  . 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 LAPAROSCOPY  05/07/2017   Procedure: LAPAROSCOPY DIAGNOSTIC;  Surgeon: Ouida Sills Gwen Her, MD;  Location: [facility] ORS;  Service: Gynecology;;    Family History  Problem Relation Age of Onset  . Asthma Father   . Hypertension Father   . ADD / ADHD Brother     Social History   Tobacco Use  . Smoking status: Former Smoker    Packs/day: 0.25    Years: 2.00    Pack years: 0.50    Start date: 03/01/2017    Quit date: 07/11/2020    Years since quitting: 0.4  . Smokeless tobacco: Never Used  . Tobacco comment: patient states she vapes once a day  Substance Use Topics  . Alcohol use: No    Alcohol/week: 0.0 standard drinks     Current Outpatient Medications:  .  acetaminophen (TYLENOL) 325 MG tablet, Take 2 tablets (650 mg total) by mouth every 4 (four) hours as needed (for pain scale <  4)., Disp: , Rfl:  .  albuterol (VENTOLIN HFA) 108 (90 Base) MCG/ACT inhaler, Inhale 2 puffs into the lungs every 6 (six) hours as needed for wheezing or shortness of breath., Disp: 8 g, Rfl: 2 .  ARIPiprazole (ABILIFY) 5 MG tablet, Take 5 mg by mouth daily., Disp: , Rfl:  .  Blood Pressure KIT, 1 kit by Does not apply route daily. Notify provider for blood pressures less than 90/50 or 160/110 or greater (Patient not taking: No sig reported), Disp: 1 kit, Rfl: 0 .  cetirizine (ZYRTEC) 10 MG tablet, Take 1 tablet (10 mg total) by mouth daily., Disp: 90 tablet, Rfl: 1 .  cyclobenzaprine (FLEXERIL) 10 MG tablet, Take 1 tablet (10 mg total) by mouth every 8 (eight) hours as needed for muscle spasms. (Patient not taking: No sig reported), Disp: 30 tablet, Rfl: 0 .  famotidine (PEPCID) 40 MG tablet, Take by mouth. (Patient not taking: No sig reported), Disp: , Rfl:  .  ibuprofen (ADVIL) 600 MG tablet, Take 1 tablet (600 mg total) by mouth every 6 (six) hours as needed for mild pain or moderate pain. (Patient not taking: No sig reported), Disp: 60 tablet, Rfl: 1 .  meclizine (ANTIVERT) 25 MG tablet, , Disp: , Rfl:  .  medroxyPROGESTERone (DEPO-PROVERA) 150 MG/ML injection, Inject into the muscle., Disp: , Rfl:  .  montelukast (SINGULAIR) 10 MG tablet, Take 1 tablet (10 mg total) by mouth  at bedtime., Disp: 90 tablet, Rfl: 1 .  NIFEdipine (ADALAT CC) 60 MG 24 hr tablet, Take 1 tablet (60 mg total) by mouth daily., Disp: 30 tablet, Rfl: 1 .  NIFEdipine (PROCARDIA XL/NIFEDICAL XL) 60 MG 24 hr tablet, Take 60 mg by mouth daily., Disp: , Rfl:  .  polyethylene glycol powder (GLYCOLAX/MIRALAX) 17 GM/SCOOP powder, Take 17 g by mouth daily. (Patient not taking: No sig reported), Disp: 3350 g, Rfl: 1 .  Prenatal Vit-Fe Fumarate-FA (PRENATAL MULTIVITAMIN) TABS tablet, Take 1 tablet by mouth daily at 12 noon., Disp: 100 tablet, Rfl: 1 .  sertraline (ZOLOFT) 100 MG tablet, Take 100 mg by mouth daily., Disp: , Rfl:  .   Spacer/Aero-Holding [place] ([place]) [place], See admin instructions. use with inhaler (Patient not taking: No sig reported), Disp: , Rfl: 0 .  STIMULANT LAXATIVE 8.6-50 MG tablet, Take 2 tablets by mouth at bedtime as needed. (Patient not taking: No sig reported), Disp: , Rfl:   No Known Allergies  I personally reviewed {Reviewed:14835} with the patient/caregiver today.   ROS  ***  Objective  There were no vitals filed for this visit.  There is no height or weight on file to calculate BMI.  Physical Exam ***  No results found for this or any previous visit (from the past 2160 hour(s)).  Diabetic Foot Exam: Diabetic Foot Exam - Simple   No data filed    ***  PHQ2/9: Depression screen [facility] 2/9 08/29/2020 07/25/2020 06/26/2020 06/12/2020 11/23/2019  Decreased Interest 2 3 2 2  0  Down, Depressed, Hopeless 2 2 2 2 2   PHQ - 2 Score 4 5 4 4 2   Altered sleeping 1 3 1 1  0  Tired, decreased energy 1 3 1 1  0  Change in appetite 1 3 1 1  0  Feeling bad or failure about yourself  1 3 3 3  0  Trouble concentrating 2 1 1 1 1   Moving slowly or fidgety/restless 2 1 1 1 1   Suicidal thoughts 2 0 0 0 0  PHQ-9 Score 14 19 12 12 4   Difficult doing work/chores Extremely dIfficult Somewhat difficult Extremely dIfficult Extremely dIfficult Very difficult  Some recent data might be hidden    phq 9 is {gen pos [id number]} ***  Fall Risk: Fall Risk  06/26/2020 06/12/2020 11/23/2019 09/06/2019 09/02/2019  Falls in the past year? 0 0 0 0 0  Number falls in past yr: 0 0 0 0 0  Injury with Fall? 0 0 0 0 0  Follow up - - - Falls evaluation completed Falls evaluation completed   ***   Functional Status Survey:   ***   Assessment & Plan  *** There are no diagnoses linked to this encounter.

## 2021-01-04 ENCOUNTER — Ambulatory Visit: Payer: Medicaid Other | Admitting: Family Medicine

## 2021-01-04 ENCOUNTER — Encounter: Payer: Self-pay | Admitting: Family Medicine

## 2021-01-07 ENCOUNTER — Other Ambulatory Visit: Payer: Self-pay

## 2021-01-07 ENCOUNTER — Other Ambulatory Visit: Payer: Self-pay | Admitting: Obstetrics and Gynecology

## 2021-01-07 NOTE — Patient Outreach (Signed)
Medicaid Managed Care   Nurse Care Manager Note  01/07/2021 Name:  Grace [name] MRN:  [id number] DOB:  05/06/2001  Grace [name] is an 20 y.o. year old female who is a primary patient of Steele Sizer, MD.  The [facility] Managed Care Coordination team was consulted for assistance with:    chronic healthcare management needs.  Ms. Thole was given information about Medicaid Managed Care Coordination team services today. Grace [name] agreed to services and verbal consent obtained.  Engaged with patient by telephone for follow up visit in response to provider referral for case management and/or care coordination services.   Assessments/Interventions:  Review of past medical history, allergies, medications, health status, including review of consultants reports, laboratory and other test data, was performed as part of comprehensive evaluation and provision of chronic care management services.  SDOH (Social Determinants of Health) assessments and interventions performed:   Care Plan  No Known Allergies  Medications Reviewed Today    Reviewed by Gayla Medicus, RN (Registered Nurse) on 01/07/21 at [place] List Status: <None>  Medication Order Taking? Sig Documenting Provider Last Dose Status Informant  acetaminophen (TYLENOL) 325 MG tablet [id number] Yes Take 2 tablets (650 mg total) by mouth every 4 (four) hours as needed (for pain scale < 4). Minda Meo, CNM Taking Active   albuterol (VENTOLIN HFA) 108 (90 Base) MCG/ACT inhaler [id number] Yes Inhale 2 puffs into the lungs every 6 (six) hours as needed for wheezing or shortness of breath. Marlana Salvage, PA Taking Active   ARIPiprazole (ABILIFY) 5 MG tablet [id number] No Take 5 mg by mouth daily.  Patient not taking: Reported on 01/07/2021   [provider] Not Taking Active Self  Blood Pressure KIT [id number] Yes 1 kit by Does not apply route daily. Notify provider for blood pressures less than 90/50 or 160/110 or greater  Minda Meo, CNM Taking Active   cetirizine (ZYRTEC) 10 MG tablet [id number] Yes Take 1 tablet (10 mg total) by mouth daily. Steele Sizer, MD Taking Active   cyclobenzaprine (FLEXERIL) 10 MG tablet [id number] No Take 1 tablet (10 mg total) by mouth every 8 (eight) hours as needed for muscle spasms.  Patient not taking: No sig reported   Lisette Grinder, CNM Not Taking Active   famotidine (PEPCID) 40 MG tablet [id number] No Take by mouth.  Patient not taking: No sig reported   [provider] Not Taking Active   ibuprofen (ADVIL) 600 MG tablet [id number] No Take 1 tablet (600 mg total) by mouth every 6 (six) hours as needed for mild pain or moderate pain.  Patient not taking: No sig reported   Minda Meo, CNM Not Taking Active   meclizine (ANTIVERT) 25 MG tablet [id number] No   Patient not taking: No sig reported   [provider] Not Taking Active   medroxyPROGESTERone (DEPO-PROVERA) 150 MG/ML injection [id number] Yes Inject into the muscle. [provider] Taking Active   montelukast (SINGULAIR) 10 MG tablet [id number] Yes Take 1 tablet (10 mg total) by mouth at bedtime. Steele Sizer, MD Taking Active   NIFEdipine (ADALAT CC) 60 MG 24 hr tablet [id number] No Take 1 tablet (60 mg total) by mouth daily.  Patient not taking: Reported on 01/07/2021   Minda Meo, CNM Not Taking Active   NIFEdipine (PROCARDIA XL/NIFEDICAL XL) 60 MG 24 hr tablet [id number] Yes Take 60 mg by mouth daily. [provider] Taking Active   polyethylene glycol powder (GLYCOLAX/MIRALAX)  17 GM/SCOOP powder [id number] No Take 17 g by mouth daily.  Patient not taking: No sig reported   Steele Sizer, MD Not Taking Active   Prenatal Vit-Fe Fumarate-FA (PRENATAL MULTIVITAMIN) TABS tablet [id number] Yes Take 1 tablet by mouth daily at 12 noon. Steele Sizer, MD Taking Active   sertraline (ZOLOFT) 100 MG tablet [id number] Yes Take 100 mg by mouth daily. [provider]  Taking Active Self  Spacer/Aero-Holding Donavan Burnet [employer] [place]) [place] [id number] No See admin instructions. use with inhaler  Patient not taking: No sig reported   [provider] Not Taking Active   STIMULANT LAXATIVE 8.6-50 MG tablet [id number] No Take 2 tablets by mouth at bedtime as needed.  Patient not taking: No sig reported   [provider] Not Taking Active   Med List Note [name], [facility] 11/04/19 [id number]): Pt has been out of most meds for a few weeks          Patient Active Problem List   Diagnosis Date Noted  . Elevated blood pressure affecting pregnancy in third trimester, antepartum 07/25/2020  . Labor and delivery indication for care or intervention 07/25/2020  . Supervision of high risk pregnancy in third trimester 01/12/2020  . Mood disorder ([place]) 11/15/2018  . PTSD (post-traumatic stress disorder) 11/15/2018  . Class 1 obesity due to excess calories without serious comorbidity with body mass index (BMI) of 30.0 to 30.9 in adult 11/15/2018  . Neutropenia ([place]) 08/06/2018  . Major depression, recurrent, chronic ([place]) 08/06/2018  . GAD (generalized anxiety disorder) 08/06/2018  . Leukopenia 11/11/2016  . Allergy to nuts 09/15/2016  . Panic attack 01/22/2016  . Chronic allergic rhinitis 12/20/2015  . Asthma, moderate persistent, poorly-controlled 12/20/2015  . History of depression 12/20/2015  . Primary dysmenorrhea 12/20/2015    Conditions to be addressed/monitored per PCP order:  chronic healthcare management needs, asthma, HTN, anxiety, depression.  Care Plan : General Plan of Care (Adult)  Updates made by Gayla Medicus, RN since 01/07/2021 12:00 AM    Problem: Health Promotion or Disease Self-Management (General Plan of Care)   Priority: High  Onset Date: 12/13/2020    Long-Range Goal: Self-Management Plan Developed   Start Date: 07/23/2020  Expected End Date: 04/09/2021  Recent Progress: Not on track  Priority: High   Note:   Current Barriers:  . Patient delivered in [place] not had postpartum visit yet.  Patient with elevated blood pressures-130's over 90's per patient with slight headache.  Patient also with wheezing-needs to follow up with PCP.  Nurse Case Manager Clinical Goal(s):  Marland Kitchen Over the next 30 days, patient will verbalize understanding of plan for transportation needs. Marland Kitchen Update 09/28/20:  Patient states she is not having any transportation issues right now. . Over the next 30 days, patient will work with medical provider  to address needs related to ongoing pelvic pain . Update 09/28/20:  Patient states pelvic pain is better and she doesn't feel like she needs PT right now. . Over the next 30 days, patient will attend all scheduled medical appointments:  Appointment 10/05/20 for post partum visit. Marland Kitchen Update 10/25/20:  Patient unable to attend appointment 10/05/20-encouraged to reschedule asap.Update 11/12/20:  Patient states she has post partum appointment scheduled for 11/13/20. Marland Kitchen Update 12/13/20:  Patient did not attend post partum visit, encouraged patient to schedule an appointment with her provider. Marland Kitchen Update 01/07/21:  Patient still needs to schedule provider appointment-states she will do so.  Interventions:  . Inter-disciplinary care team  collaboration (see longitudinal plan of care) . Evaluation of current treatment plan and patient's adherence to plan as established by provider. . Advised patient to contact medical provider's office with increasing BP for evaluation. . Reviewed medications with patient. . Discussed plans with patient for ongoing care management follow up and provided patient with direct contact information for care management team . Reviewed scheduled/upcoming provider appointments. . SW referral for social issues. Marland Kitchen Update 12/13/20:  Patient has met with SW.  Patient Goals/Self-Care Activities Over the next 30 days, patient will:  -Patient will attend all scheduled  appointments.  Follow Up Plan:  The Managed Medicaid care management team will reach out to the patient again over the next  30 days.  The patient has been provided with contact information for the Managed Medicaid care management team and has been advised to call with any health related questions or concerns.    Problem: Coping Skills (General Plan of Care)   Priority: High  Onset Date: 07/23/2020  Note:   Current Barriers:  . Care Coordination needs related to anxiety and depression.  Nurse Case Manager Clinical Goal(s):  Marland Kitchen Over the next 30 days, patient will work with CM clinical social worker to address needs related to anxiety and depression.  Interventions:  . Inter-disciplinary care team collaboration (see longitudinal plan of care) . Collaborated with LCSW regarding patient. . Discussed plans with patient for ongoing care management follow up and provided patient with direct contact information for care management team . Social Work referral for anxiety/depression. Marland Kitchen Update 01/07/21:  patient has met with SW-patient would like virtual therapy appointment if possible due to work schedule.  Patient Goals/Self-Care Activities Over the next 30 days, patient will:  -Patient will speak with LCSW concerning anxiety and depression.  Follow Up Plan: Patient will follow up with LCSW regarding counseling needs.. Licensed Clinical Social Worker will schedule appointment for patient for evaluation of current needs. The Managed Medicaid care management team will reach out to the patient again over the next 30 days.  The patient has been provided with contact information for the Managed Medicaid care management team and has been advised to call with any health related questions or concerns.    Follow Up:  Patient agrees to Care Plan and Follow-up.  Plan: The Managed Medicaid care management team will reach out to the patient again over the next 30 days. and The patient has been provided  with contact information for the Managed Medicaid care management team and has been advised to call with any health related questions or concerns.  Date/time of next scheduled RN care management/care coordination outreach:  02/07/21 at 230.

## 2021-01-07 NOTE — Patient Instructions (Signed)
Hi [name] you for speaking with me today-please don't forget to schedule your follow up appointment with your provider.  Thank you!  [name] was given information about Medicaid Managed Care team care coordination services as a part of their [facility] Medicaid benefit. Grace [name] verbally consented to engagement with the [facility] Managed Care team.   For questions related to your [facility] health plan, please call: [phone]  If you would like to schedule transportation through your [facility] plan, please call the following number at least 2 days in advance of your appointment: [phone].  Call the [facility] at [phone], at any time, 24 hours a day, 7 days a week. If you are in danger or need immediate medical attention call 911.  [name] - following are the goals we discussed in your visit today:  Goals Addressed            This Visit's Progress   . Protect My Health       Timeframe:  Long-Range Goal Priority:  High Start Date:      09/28/20                       Expected End Date:        04/09/21            Follow Up Date 02/07/21   - schedule appointment for vaccines needed due to my age or health - schedule recommended health tests (blood work, mammogram, colonoscopy, pap test) - schedule and keep appointment for annual check-up  Update 10/25/20:  Patient needs to schedule post partum visit and PCP visit. Update 11/12/20:  patient states she has post partum appointment scheduled for 11/13/20. Update 12/13/20:  patient said she did not attend post partum appointment, encouraged patient to schedule an appointment with a provider. Update 01/07/21:  patient has not attended provider appointment yet, states she will schedule-missed last appointment because needed to get car fixed.      Patient verbalizes understanding of instructions provided today.   The Managed Medicaid care management team will reach out to the patient again  over the next 30 days.  The patient has been provided with contact information for the Managed Medicaid care management team and has been advised to call with any health related questions or concerns.   Grace Raider RN, BSN [place]  Triad Curator - Managed Medicaid High Risk [phone].  Following is a copy of your plan of care:  Patient Care Plan: General Plan of Care (Adult)    Problem Identified: Health Promotion or Disease Self-Management (General Plan of Care)   Priority: High  Onset Date: 12/13/2020    Long-Range Goal: Self-Management Plan Developed   Start Date: 07/23/2020  Expected End Date: 04/09/2021  Recent Progress: Not on track  Priority: High  Note:   Current Barriers:  . Patient delivered in [place] not had postpartum visit yet.  Patient with elevated blood pressures-130's over 90's per patient with slight headache.  Patient also with wheezing-needs to follow up with PCP.  Nurse Case Manager Clinical Goal(s):  Grace [name] Over the next 30 days, patient will verbalize understanding of plan for transportation needs. Grace [name] Update 09/28/20:  Patient states she is not having any transportation issues right now. . Over the next 30 days, patient will work with medical provider  to address needs related to ongoing pelvic pain . Update 09/28/20:  Patient states pelvic pain is better  and she doesn't feel like she needs PT right now. . Over the next 30 days, patient will attend all scheduled medical appointments:  Appointment 10/05/20 for post partum visit. Grace [name] Update 10/25/20:  Patient unable to attend appointment 10/05/20-encouraged to reschedule asap.Update 11/12/20:  Patient states she has post partum appointment scheduled for 11/13/20. Grace [name] Update 12/13/20:  Patient did not attend post partum visit, encouraged patient to schedule an appointment with her provider. Grace [name] Update 01/07/21:  Patient still needs to schedule provider appointment-states she will do  so.  Interventions:  . Inter-disciplinary care team collaboration (see longitudinal plan of care) . Evaluation of current treatment plan and patient's adherence to plan as established by provider. . Advised patient to contact medical provider's office with increasing BP for evaluation. . Reviewed medications with patient. . Discussed plans with patient for ongoing care management follow up and provided patient with direct contact information for care management team . Reviewed scheduled/upcoming provider appointments. . SW referral for social issues. Grace [name] Update 12/13/20:  Patient has met with SW.  Patient Goals/Self-Care Activities Over the next 30 days, patient will:  -Patient will attend all scheduled appointments.  Follow Up Plan:  The Managed Medicaid care management team will reach out to the patient again over the next  30 days.  The patient has been provided with contact information for the Managed Medicaid care management team and has been advised to call with any health related questions or concerns.    Problem Identified: Coping Skills (General Plan of Care)   Priority: High  Onset Date: 07/23/2020  Note:   Current Barriers:  . Care Coordination needs related to anxiety and depression.  Nurse Case Manager Clinical Goal(s):  Grace [name] Over the next 30 days, patient will work with CM clinical social worker to address needs related to anxiety and depression.  Interventions:  . Inter-disciplinary care team collaboration (see longitudinal plan of care) . Collaborated with LCSW regarding patient. . Discussed plans with patient for ongoing care management follow up and provided patient with direct contact information for care management team . Social Work referral for anxiety/depression. Grace [name] Update 01/07/21:  patient has met with SW-patient would like virtual therapy appointment if possible due to work schedule.  Patient Goals/Self-Care Activities Over the next 30 days, patient will:   -Patient will speak with LCSW concerning anxiety and depression.  Follow Up Plan: Patient will follow up with LCSW regarding counseling needs.. Licensed Clinical Social Worker will schedule appointment for patient for evaluation of current needs. The Managed Medicaid care management team will reach out to the patient again over the next 30 days.  The patient has been provided with contact information for the Managed Medicaid care management team and has been advised to call with any health related questions or concerns.

## 2021-01-11 ENCOUNTER — Encounter: Payer: Self-pay | Admitting: Family Medicine

## 2021-01-17 ENCOUNTER — Other Ambulatory Visit: Payer: Self-pay

## 2021-01-17 NOTE — Patient Outreach (Signed)
Meds due for refill 01/23/21  Medication  Cetirizine 10mg   Montelukast 10mg   ProAir HFA  Sertraline 100mg       Patient missed appt with PCP so unable to get Abilify. Told her to schedule new appt with new PCP ASAP

## 2021-01-21 ENCOUNTER — Other Ambulatory Visit: Payer: Self-pay | Admitting: Family Medicine

## 2021-01-21 DIAGNOSIS — J309 Allergic rhinitis, unspecified: Secondary | ICD-10-CM

## 2021-01-21 DIAGNOSIS — J4540 Moderate persistent asthma, uncomplicated: Secondary | ICD-10-CM

## 2021-02-07 ENCOUNTER — Other Ambulatory Visit: Payer: Self-pay | Admitting: Obstetrics and Gynecology

## 2021-02-07 NOTE — Patient Outreach (Signed)
Care Coordination  02/07/2021  Grace [name] 09-18-2000 [id number]   Medicaid Managed Care   Unsuccessful Outreach Note  02/07/2021 Name: Grace [name] MRN: [id number] DOB: [birth year]  Referred by: Alba Cory, MD Reason for referral : High Risk Managed Medicaid (Unsuccessful telephone outreach)   An unsuccessful telephone outreach was attempted today. The patient was referred to the case management team for assistance with care management and care coordination.   Follow Up Plan: A member of the Managed Medicaid care management team will reach out to the patient again over the next 7-14 days.   Kathi Der RN, BSN [place]  Triad Engineer, production - Managed Medicaid High Risk [phone].

## 2021-02-07 NOTE — Patient Instructions (Signed)
Hi [name]-I hope you are doing okay, I am sorry I missed you today - as a part of your Medicaid benefit, you are eligible for care management and care coordination services at no cost or copay. I was unable to reach you by phone today but would be happy to help you with your health related needs. Please feel free to call me at [phone].   A member of the Managed Medicaid care management team will reach out to you again over the next 7-14 days.   Kathi Der RN, BSN [place]  Triad Engineer, production - Managed Medicaid High Risk [phone].

## 2021-02-14 ENCOUNTER — Ambulatory Visit: Payer: Self-pay

## 2021-02-19 ENCOUNTER — Other Ambulatory Visit: Payer: Self-pay | Admitting: Licensed Clinical Social Worker

## 2021-02-19 NOTE — Patient Outreach (Signed)
[facility]) Care Management  [facility] Social Work  02/19/2021  Grace [name] Sep 04, 2000 [id number]  Encounter Medications:  Outpatient Encounter Medications as of 02/19/2021  Medication Sig   acetaminophen (TYLENOL) 325 MG tablet Take 2 tablets (650 mg total) by mouth every 4 (four) hours as needed (for pain scale < 4).   albuterol (VENTOLIN HFA) 108 (90 Base) MCG/ACT inhaler Inhale 2 puffs into the lungs every 6 (six) hours as needed for wheezing or shortness of breath.   ARIPiprazole (ABILIFY) 5 MG tablet Take 5 mg by mouth daily. (Patient not taking: Reported on 01/07/2021)   Blood Pressure KIT 1 kit by Does not apply route daily. Notify provider for blood pressures less than 90/50 or 160/110 or greater   cetirizine (ZYRTEC) 10 MG tablet Take 1 tablet (10 mg total) by mouth daily.   cyclobenzaprine (FLEXERIL) 10 MG tablet Take 1 tablet (10 mg total) by mouth every 8 (eight) hours as needed for muscle spasms. (Patient not taking: No sig reported)   famotidine (PEPCID) 40 MG tablet Take by mouth. (Patient not taking: No sig reported)   ibuprofen (ADVIL) 600 MG tablet Take 1 tablet (600 mg total) by mouth every 6 (six) hours as needed for mild pain or moderate pain. (Patient not taking: No sig reported)   meclizine (ANTIVERT) 25 MG tablet  (Patient not taking: No sig reported)   medroxyPROGESTERone (DEPO-PROVERA) 150 MG/ML injection Inject into the muscle.   montelukast (SINGULAIR) 10 MG tablet Take 1 tablet (10 mg total) by mouth at bedtime.   NIFEdipine (ADALAT CC) 60 MG 24 hr tablet Take 1 tablet (60 mg total) by mouth daily. (Patient not taking: Reported on 01/07/2021)   NIFEdipine (PROCARDIA XL/NIFEDICAL XL) 60 MG 24 hr tablet Take 60 mg by mouth daily.   polyethylene glycol powder (GLYCOLAX/MIRALAX) 17 GM/SCOOP powder Take 17 g by mouth daily. (Patient not taking: No sig reported)   Prenatal Vit-Fe Fumarate-FA (PRENATAL MULTIVITAMIN) TABS tablet Take 1 tablet by mouth  daily at 12 noon.   sertraline (ZOLOFT) 100 MG tablet Take 100 mg by mouth daily.   Spacer/Aero-Holding [place] (OPTICHAMBER Shaketha-LG MASK) [place] See admin instructions. use with inhaler (Patient not taking: No sig reported)   STIMULANT LAXATIVE 8.6-50 MG tablet Take 2 tablets by mouth at bedtime as needed. (Patient not taking: No sig reported)   No facility-administered encounter medications on file as of 02/19/2021.    Functional Status:  In your present state of health, do you have any difficulty performing the following activities: 07/25/2020 06/26/2020  Hearing? N N  Vision? N N  Difficulty concentrating or making decisions? N N  Walking or climbing stairs? N N  Dressing or bathing? N N  Doing errands, shopping? Y N  Some recent data might be hidden    Fall/Depression Screening:  PHQ 2/9 Scores 08/29/2020 07/25/2020 06/26/2020 06/12/2020 11/23/2019 09/06/2019 09/02/2019  PHQ - 2 Score $Remov'4 5 4 4 2 2 5  'mipGOf$ PHQ- 9 Score $Remov'14 19 12 12 4 7 17    'LdWlCm$ Patient reports that she is unable to complete [facility] LCSW appointment today as she has not had any sleep in several days now and is unable to talk and needs to try and rest. [facility] LCSW provided brief emotional support and rescheduled her appointment for 02/26/21.   Assessment:  Care Plan There are no care plans that you recently modified to display for this patient.   Plan:  Follow-up:  Follow-up in 7 day(s)  Eula Fried,  BSW, MSW, [employer] Managed Medicaid [email or web address] Phone: [phone]

## 2021-02-19 NOTE — Patient Instructions (Signed)
Visit Information  Ms. Whitehorn was given information about Medicaid Managed Care team care coordination services as a part of their [facility] Medicaid benefit. CHICQUITA MENDEL verbally consented to engagement with the [facility] Managed Care team.   For questions related to your [facility] health plan, please call: [phone] or go here:[email or web address]  If you would like to schedule transportation through your [facility] plan, please call the following number at least 2 days in advance of your appointment: [phone].  Call the [facility] Crisis Line at [phone], at any time, 24 hours a day, 7 days a week. If you are in danger or need immediate medical attention call 911.  Dickie La, BSW, MSW, [employer] Managed Medicaid LCSW [facility]  [facility] [email or web address] Phone: [phone]

## 2021-02-22 ENCOUNTER — Other Ambulatory Visit: Payer: Self-pay

## 2021-02-22 ENCOUNTER — Other Ambulatory Visit: Payer: Self-pay | Admitting: Obstetrics and Gynecology

## 2021-02-22 DIAGNOSIS — O0001 Abdominal pregnancy with intrauterine pregnancy: Secondary | ICD-10-CM

## 2021-02-22 NOTE — Patient Outreach (Signed)
Medicaid Managed Care   Nurse Care Manager Note  02/22/2021 Name:  Grace [name] MRN:  [id number] DOB:  [birth year]  Grace [name] is an 20 y.o. year old female who is a primary patient of Grace [name].  The [facility] Managed Care Coordination team was consulted for assistance with:    Chronic healthcare management needs.  [name] was given information about Medicaid Managed Care Coordination team services today. Grace [name] agreed to services and verbal consent obtained.  Engaged with patient by telephone for follow up visit in response to provider referral for case management and/or care coordination services.   Assessments/Interventions:  Review of past medical history, allergies, medications, health status, including review of consultants reports, laboratory and other test data, was performed as part of comprehensive evaluation and provision of chronic care management services.  SDOH (Social Determinants of Health) assessments and interventions performed:   Care Plan  No Known Allergies  Medications Reviewed Today     Reviewed by Grace Chandler, RN (Registered Nurse) on 02/22/21 at 1406  Med List Status: <None>   Medication Order Taking? Sig Documenting Provider Last Dose Status Informant  acetaminophen (TYLENOL) 325 MG tablet [id number] Yes Take 2 tablets (650 mg total) by mouth every 4 (four) hours as needed (for pain scale < 4). Grace [name] Taking Active   albuterol (VENTOLIN HFA) 108 (90 Base) MCG/ACT inhaler [id number] Yes Inhale 2 puffs into the lungs every 6 (six) hours as needed for wheezing or shortness of breath. Grace Chris, PA Taking Active   ARIPiprazole (ABILIFY) 5 MG tablet [id number] No Take 5 mg by mouth daily.  Patient not taking: No sig reported   Provider, Historical, [name] Not Taking Active   Blood Pressure KIT [id number] Yes 1 kit by Does not apply route daily. Notify provider for blood pressures less than 90/50 or 160/110 or greater Grace [name] Taking Active   cetirizine (ZYRTEC) 10 MG tablet [id number] Yes Take 1 tablet (10 mg total) by mouth daily. Grace [name] Taking Active   cyclobenzaprine (FLEXERIL) 10 MG tablet [id number] No Take 1 tablet (10 mg total) by mouth every 8 (eight) hours as needed for muscle spasms.  Patient not taking: No sig reported   Grace [name] Not Taking Active   famotidine (PEPCID) 40 MG tablet [id number] No Take by mouth.  Patient not taking: No sig reported   Provider, Historical, [name] Not Taking Active   ibuprofen (ADVIL) 600 MG tablet [id number] No Take 1 tablet (600 mg total) by mouth every 6 (six) hours as needed for mild pain or moderate pain.  Patient not taking: No sig reported   Grace [name] Not Taking Active   meclizine (ANTIVERT) 25 MG tablet [id number] No   Patient not taking: No sig reported   Provider, Historical, [name] Not Taking Active   medroxyPROGESTERone (DEPO-PROVERA) 150 MG/ML injection [id number] Yes Inject into the muscle. Provider, Historical, [name] Taking Active   montelukast (SINGULAIR) 10 MG tablet [id number] Yes Take 1 tablet (10 mg total) by mouth at bedtime. Grace [name] Taking Active   NIFEdipine (ADALAT CC) 60 MG 24 hr tablet [id number] No Take 1 tablet (60 mg total) by mouth daily.  Patient not taking: No sig reported   Grace [name] Not Taking Active   NIFEdipine (PROCARDIA XL/NIFEDICAL XL) 60 MG 24 hr tablet [id number] No Take 60 mg by mouth daily.  Patient not taking: Reported on 02/22/2021  Provider, Historical, [name] Not Taking Active   polyethylene glycol powder (GLYCOLAX/MIRALAX) 17 GM/SCOOP powder [id number] No Take 17 g by mouth daily.  Patient not taking: No sig reported   Grace [name] Not Taking Active   Prenatal Vit-Fe Fumarate-FA (PRENATAL MULTIVITAMIN) TABS tablet [id number] Yes Take 1 tablet by mouth daily at 12 noon. Grace [name] Taking Active   sertraline (ZOLOFT) 100 MG tablet [id number] No Take 100 mg by mouth  daily.  Patient not taking: Reported on 02/22/2021   Provider, Historical, [name] Not Taking Active Self  Spacer/Aero-Holding Chambers ([place]) DEVI [id number] No See admin instructions. use with inhaler  Patient not taking: No sig reported   Provider, Historical, [name] Not Taking Active   STIMULANT LAXATIVE 8.6-50 MG tablet [id number] No Take 2 tablets by mouth at bedtime as needed.  Patient not taking: No sig reported   Provider, Historical, [name] Not Taking Active   Med List Note Grace [name], [facility] 11/04/19 0947): Pt has been out of most meds for a few weeks            Patient Active Problem List   Diagnosis Date Noted   Elevated blood pressure affecting pregnancy in third trimester, antepartum 07/25/2020   Labor and delivery indication for care or intervention 07/25/2020   Supervision of high risk pregnancy in third trimester 01/12/2020   Mood disorder ([place]) 11/15/2018   PTSD (post-traumatic stress disorder) 11/15/2018   Class 1 obesity due to excess calories without serious comorbidity with body mass index (BMI) of 30.0 to 30.9 in adult 11/15/2018   Neutropenia ([place]) 08/06/2018   Major depression, recurrent, chronic ([place]) 08/06/2018   GAD (generalized anxiety disorder) 08/06/2018   Leukopenia 11/11/2016   Allergy to nuts 09/15/2016   Panic attack 01/22/2016   Chronic allergic rhinitis 12/20/2015   Asthma, moderate persistent, poorly-controlled 12/20/2015   History of depression 12/20/2015   Primary dysmenorrhea 12/20/2015    Conditions to be addressed/monitored per PCP order:   chronic healthcare mangement needs, HTN, anxiety/depression, asthma  Care Plan : General Plan of Care (Adult)  Updates made by Gayla Medicus, RN since 02/22/2021 12:00 AM     Problem: Health Promotion or Disease Self-Management (General Plan of Care)   Priority: High  Onset Date: 12/13/2020     Long-Range Goal: Self-Management Plan Developed   Start Date: 07/23/2020  Expected  End Date: 04/09/2021  Recent Progress: Not on track  Priority: High  Note:   Current Barriers:  Patient delivered in [place] not had postpartum visit yet.  Patient with elevated blood pressures-130's over 90's per patient with slight headache.  Patient also with wheezing-needs to follow up with PCP.  Nurse Case Manager Clinical Goal(s):  Over the next 30 days, patient will verbalize understanding of plan for transportation needs. Update 09/28/20:  Patient states she is not having any transportation issues right now. Over the next 30 days, patient will work with medical provider  to address needs related to ongoing pelvic pain Update 09/28/20:  Patient states pelvic pain is better and she doesn't feel like she needs PT right now. Over the next 30 days, patient will attend all scheduled medical appointments:  Appointment 10/05/20 for post partum visit. Update 10/25/20:  Patient unable to attend appointment 10/05/20-encouraged to reschedule asap.Update 11/12/20:  Patient states she has post partum appointment scheduled for 11/13/20. Update 12/13/20:  Patient did not attend post partum visit, encouraged patient to schedule an appointment with her provider. Update 01/07/21:  Patient  still needs to schedule provider appointment-states she will do so. Update 02/22/21:  Patient states she was released from Dr. Ancil Boozer office due to no shows-needs new PCP. Interventions:  Inter-disciplinary care team collaboration (see longitudinal plan of care) Evaluation of current treatment plan and patient's adherence to plan as established by provider. Advised patient to contact medical provider's office with increasing BP for evaluation. Reviewed medications with patient. Discussed plans with patient for ongoing care management follow up and provided patient with direct contact information for care management team Reviewed scheduled/upcoming provider appointments. Care guide referral for PCP. SW referral for social  issues. Update 12/13/20:  Patient has met with SW.  Patient Goals/Self-Care Activities Over the next 30 days, patient will:  -Patient will attend all scheduled appointments.  Follow Up Plan:   The Managed Medicaid care management team will reach out to the patient again over the next  30 days.  The patient has been provided with contact information for the Managed Medicaid care management team and has been advised to call with any health related questions or concerns.     Follow Up:  Patient agrees to Care Plan and Follow-up.  Plan: The Managed Medicaid care management team will reach out to the patient again over the next 30 days. and The patient has been provided with contact information for the Managed Medicaid care management team and has been advised to call with any health related questions or concerns.  Date/time of next scheduled RN care management/care coordination outreach:  03/27/21 at 1030.

## 2021-02-22 NOTE — Patient Instructions (Signed)
Hi [name], thank you for speaking with me today.  [name] was given information about Medicaid Managed Care team care coordination services as a part of their [facility] Medicaid benefit. Grace [name] verbally consented to engagement with the [facility] Managed Care team.   For questions related to your [facility] health plan, please call: [phone] or go here:[email or web address]  If you would like to schedule transportation through your [facility] plan, please call the following number at least 2 days in advance of your appointment: [phone].  Call the [place] at [phone], at any time, 24 hours a day, 7 days a week. If you are in danger or need immediate medical attention call 911.  [name] - following are the goals we discussed in your visit today:   Goals Addressed      Protect My Health       Timeframe:  Long-Range Goal Priority:  High Start Date:      09/28/20                       Expected End Date: ongoing         Follow Up Date:  03/25/21   - schedule appointment for vaccines needed due to my age or health - schedule recommended health tests (blood work, mammogram, colonoscopy, pap test) - schedule and keep appointment for annual check-up  Update 10/25/20:  Patient needs to schedule post partum visit and PCP visit. Update 11/12/20:  patient states she has post partum appointment scheduled for 11/13/20. Update 12/13/20:  patient said she did not attend post partum appointment, encouraged patient to schedule an appointment with a provider. Update 01/07/21:  patient has not attended provider appointment yet, states she will schedule-missed last appointment because needed to get car fixed. Update 02/22/21:  Patient states she was released from Dr. Ancil Boozer office due to no shows.  Needs names for new PCP-will make referral to care guide.     Patient verbalizes understanding of instructions provided today.   The Managed Medicaid care  management team will reach out to the patient again over the next 30 days.  The patient has been provided with contact information for the Managed Medicaid care management team and has been advised to call with any health related questions or concerns.   Aida Raider RN, BSN [place]  [facility] - Managed Medicaid High Risk [phone].  Following is a copy of your plan of care:  Patient Care Plan: General Plan of Care (Adult)     Problem Identified: Health Promotion or Disease Self-Management (General Plan of Care)   Priority: High  Onset Date: 12/13/2020     Long-Range Goal: Self-Management Plan Developed   Start Date: 07/23/2020  Expected End Date: 04/09/2021  Recent Progress: Not on track  Priority: High  Note:   Current Barriers:  Patient delivered in [place] not had postpartum visit yet.  Patient with elevated blood pressures-130's over 90's per patient with slight headache.  Patient also with wheezing-needs to follow up with PCP.  Nurse Case Manager Clinical Goal(s):  Over the next 30 days, patient will verbalize understanding of plan for transportation needs. Update 09/28/20:  Patient states she is not having any transportation issues right now. Over the next 30 days, patient will work with medical provider  to address needs related to ongoing pelvic pain Update 09/28/20:  Patient states pelvic pain is better and she doesn't feel like she needs  PT right now. Over the next 30 days, patient will attend all scheduled medical appointments:  Appointment 10/05/20 for post partum visit. Update 10/25/20:  Patient unable to attend appointment 10/05/20-encouraged to reschedule asap.Update 11/12/20:  Patient states she has post partum appointment scheduled for 11/13/20. Update 12/13/20:  Patient did not attend post partum visit, encouraged patient to schedule an appointment with her provider. Update 01/07/21:  Patient still needs to schedule  provider appointment-states she will do so. Update 02/22/21:  Patient states she was released from Dr. Ancil Boozer office due to no shows-needs new PCP. Interventions:  Inter-disciplinary care team collaboration (see longitudinal plan of care) Evaluation of current treatment plan and patient's adherence to plan as established by provider. Advised patient to contact medical provider's office with increasing BP for evaluation. Reviewed medications with patient. Discussed plans with patient for ongoing care management follow up and provided patient with direct contact information for care management team Reviewed scheduled/upcoming provider appointments. Care guide referral for PCP. SW referral for social issues. Update 12/13/20:  Patient has met with SW.  Patient Goals/Self-Care Activities Over the next 30 days, patient will:  -Patient will attend all scheduled appointments.  Follow Up Plan:   The Managed Medicaid care management team will reach out to the patient again over the next  30 days.  The patient has been provided with contact information for the Managed Medicaid care management team and has been advised to call with any health related questions or concerns.

## 2021-02-26 ENCOUNTER — Other Ambulatory Visit: Payer: Self-pay | Admitting: Licensed Clinical Social Worker

## 2021-02-26 NOTE — Patient Instructions (Signed)
Visit Information  Grace [name] was given information about Medicaid Managed Care team care coordination services as a part of their [facility] Medicaid benefit. Grace [name] verbally consented to engagement with the [facility] Managed Care team.   For questions related to your [facility] health plan, please call: [phone] or go here:[email or web address]  If you would like to schedule transportation through your [facility] plan, please call the following number at least 2 days in advance of your appointment: [phone].  Call the [facility] Crisis Line at [phone], at any time, 24 hours a day, 7 days a week. If you are in danger or need immediate medical attention call 911.  Grace [name] - following are the goals we discussed in your visit today:   Goals Addressed             This Visit's Progress    Attend Therapy to Help Me Manage My Emotions       Timeframe:  Long-Range Goal Priority:  High Start Date:    02/26/21                     Expected End Date:  ongoing                    Follow Up Date- 03/11/21   Patient will:   - Answer [facility]'s call to initiate services Attend therapy and medication management appointments once scheduled through [facility].  - Take medications as prescribed.  - Actively participate in sessions.  - Report changes to providers, such as if she feels the medication is not working       [employer], MSW, [employer] Managed Medicaid LCSW [employer]  [name] Phone: [phone]

## 2021-02-26 NOTE — Patient Outreach (Addendum)
Medicaid Managed Care Social Work Note  02/26/2021 Name:  Grace [name] MRN:  [id number] DOB:  29-May-2001  Grace [name] is an 20 y.o. year old female who is a primary patient of Steele Sizer, MD.  The Medicaid Managed Care Coordination team was consulted for assistance with:  [place] and Resources  [name] was given information about Medicaid Managed CareCoordination services today. Grace [name] agreed to services and verbal consent obtained.  Engaged with patient  for by telephone forinitial visit in response to referral for case management and/or care coordination services.   Assessments/Interventions:  Review of past medical history, allergies, medications, health status, including review of consultants reports, laboratory and other test data, was performed as part of comprehensive evaluation and provision of chronic care management services.  SDOH: (Social Determinant of Health) assessments and interventions performed: SDOH Interventions    Flowsheet Row Most Recent Value  SDOH Interventions   Depression Interventions/Treatment  Referral to Psychiatry       Advanced Directives Status:  Not addressed in this encounter.  Care Plan                 No Known Allergies  Medications Reviewed Today     Reviewed by Gayla Medicus, RN (Registered Nurse) on 02/22/21 at 1406  Med List Status: <None>   Medication Order Taking? Sig Documenting Provider Last Dose Status Informant  acetaminophen (TYLENOL) 325 MG tablet [id number] Yes Take 2 tablets (650 mg total) by mouth every 4 (four) hours as needed (for pain scale < 4). Minda Meo, CNM Taking Active   albuterol (VENTOLIN HFA) 108 (90 Base) MCG/ACT inhaler [id number] Yes Inhale 2 puffs into the lungs every 6 (six) hours as needed for wheezing or shortness of breath. Marlana Salvage, PA Taking Active   ARIPiprazole (ABILIFY) 5 MG tablet [id number] No Take 5 mg by mouth daily.  Patient not taking: No sig  reported   [provider] Not Taking Active   Blood Pressure KIT [id number] Yes 1 kit by Does not apply route daily. Notify provider for blood pressures less than 90/50 or 160/110 or greater Minda Meo, CNM Taking Active   cetirizine (ZYRTEC) 10 MG tablet [id number] Yes Take 1 tablet (10 mg total) by mouth daily. Steele Sizer, MD Taking Active   cyclobenzaprine (FLEXERIL) 10 MG tablet [id number] No Take 1 tablet (10 mg total) by mouth every 8 (eight) hours as needed for muscle spasms.  Patient not taking: No sig reported   Lisette Grinder, CNM Not Taking Active   famotidine (PEPCID) 40 MG tablet [id number] No Take by mouth.  Patient not taking: No sig reported   [provider] Not Taking Active   ibuprofen (ADVIL) 600 MG tablet [id number] No Take 1 tablet (600 mg total) by mouth every 6 (six) hours as needed for mild pain or moderate pain.  Patient not taking: No sig reported   Minda Meo, CNM Not Taking Active   meclizine (ANTIVERT) 25 MG tablet [id number] No   Patient not taking: No sig reported   [provider] Not Taking Active   medroxyPROGESTERone (DEPO-PROVERA) 150 MG/ML injection [id number] Yes Inject into the muscle. [provider] Taking Active   montelukast (SINGULAIR) 10 MG tablet [id number] Yes Take 1 tablet (10 mg total) by mouth at bedtime. Steele Sizer, MD Taking Active   NIFEdipine (ADALAT CC) 60 MG 24 hr tablet [id number] No Take 1 tablet (60 mg total) by mouth daily.  Patient not taking: No sig reported   Minda Meo, CNM Not Taking Active   NIFEdipine (PROCARDIA XL/NIFEDICAL XL) 60 MG 24 hr tablet [id number] No Take 60 mg by mouth daily.  Patient not taking: Reported on 02/22/2021   [provider] Not Taking Active   polyethylene glycol powder (GLYCOLAX/MIRALAX) 17 GM/SCOOP powder [id number] No Take 17 g by mouth daily.  Patient not taking: No sig reported   Steele Sizer, MD Not Taking Active   Prenatal Vit-Fe  Fumarate-FA (PRENATAL MULTIVITAMIN) TABS tablet [id number] Yes Take 1 tablet by mouth daily at 12 noon. Steele Sizer, MD Taking Active   sertraline (ZOLOFT) 100 MG tablet [id number] No Take 100 mg by mouth daily.  Patient not taking: Reported on 02/22/2021   [provider] Not Taking Active Self  Spacer/Aero-Holding Chambers ([place]) DEVI [id number] No See admin instructions. use with inhaler  Patient not taking: No sig reported   [provider] Not Taking Active   STIMULANT LAXATIVE 8.6-50 MG tablet [id number] No Take 2 tablets by mouth at bedtime as needed.  Patient not taking: No sig reported   [provider] Not Taking Active   Med List Note [name], [facility] 11/04/19 [id number]): Pt has been out of most meds for a few weeks            Patient Active Problem List   Diagnosis Date Noted   Elevated blood pressure affecting pregnancy in third trimester, antepartum 07/25/2020   Labor and delivery indication for care or intervention 07/25/2020   Supervision of high risk pregnancy in third trimester 01/12/2020   Mood disorder ([place]) 11/15/2018   PTSD (post-traumatic stress disorder) 11/15/2018   Class 1 obesity due to excess calories without serious comorbidity with body mass index (BMI) of 30.0 to 30.9 in adult 11/15/2018   Neutropenia ([place]) 08/06/2018   Major depression, recurrent, chronic ([place]) 08/06/2018   GAD (generalized anxiety disorder) 08/06/2018   Leukopenia 11/11/2016   Allergy to nuts 09/15/2016   Panic attack 01/22/2016   Chronic allergic rhinitis 12/20/2015   Asthma, moderate persistent, poorly-controlled 12/20/2015   History of depression 12/20/2015   Primary dysmenorrhea 12/20/2015    Conditions to be addressed/monitored per PCP order:  Anxiety and Depression  Care Plan : General Social Work (Adult)  Updates made by Greg Cutter, LCSW since 02/26/2021 12:00 AM     Problem: Symptoms (Depression)       Long-Range Goal: Depression and Insomnia Symptoms Monitored and Managed   Start Date: 02/26/2021  Recent Progress: On track  Priority: High  Note:   Timeframe:  Long-Range Goal Priority:  High Start Date:    02/26/21                     Expected End Date:  ongoing                    Follow Up Date- 03/11/21  Current Barriers:  Chronic Mental Health needs related to depression and anxiety and past trauma experiences. Financial constraints related to unemployment. Transportation Substance abuse issues -  Patient reports she smoked marijuana until she was about 4 months pregnant. She also smoked cigarettes but quit recently. She now vapes. Lacks knowledge of community resource: Patient was receiving outpatient therapy at [facility], but her services were terminated because they do not accept her Medicaid plan. [facility] referral made on 02/26/21 for therapy and medication management per patient's permission.  Suicidal Ideation/Homicidal  Ideation: No  Clinical Social Work Goal(s):  Over the next 90 days, patient will work with SW monthly by telephone or in person to reduce or manage symptoms related to depression, anxiety and past trauma. Over the next 60 days, patient will work with SW to address concerns related to depression and anxiety Over the next 60 days, patient will work with SW to address needs related to scheduling appointment with appropriate outpatient providers for follow up.   Interventions: Patient interviewed and appropriate assessments performed: PHQ 2 and PHQ 9 Patient interviewed and appropriate assessments performed Provided mental health counseling with regard to depression and being a young mother. Patient reported that "everything is stressing her." She denied SI/HI/AVH. LCSW counseled patient about being a new parent and pacing herself with tasks she has set for herself, and cautioned against feeling like a failure if she doesn't do everything in the exact order she  would like. LCSW discussed coping skills and interventions for her to utilize whenever she feels stressed. Provided patient with information about caring for self and utilizing support system after childbirth. Discussed plans with patient for ongoing care management follow up and provided patient with direct contact information for care management team Collaborated with RN Case Manager re: outpatient therapy and medication management needs Collaborated with BSW re: scheduling appointment with [facility] [phone]. Brief CBT, Emotional/Supportive Counseling, Psychoeducation and/or Health Education, and Referred to counselor/psychotherapist BSW contacted [facility] to make a new patient appointment. BSW was informed that a referral has to be submitted. BSW will send referral. Referral sent on 08/06/20. Update 08/29/2020: Patient stated she has not received a call from [facility] to schedule appointments. LCSW will collaborate with BSW regarding scheduling. LCSW will consider referring to another agency if unable to get patient scheduled [facility] [phone] or [phone]). BSW contacted [facility] and scheduled patient for an appointment on 09/13/20 at [place] contacted patient to inform her of her therapy appointment on 09/13/20 at 2pm. Patient stated she would go to the appointment and asked for an email for a reminder and for the address. Patient's email [email or web address]. Update 09/20/20: Patient stated she had to reschedule her appointment with [facility], when the snow came she tried to reschedule, but they told her they would have to enter it into the system as a no show and she would have to find somewhere else to go. BSW contacted [facility] to check on referral placed on 08/06/20, BSW left a voicemail for a telephone call back. BSW will wait until Friday 09/21/20  for a telephone call back before finding patient somewhere else to go. Update 09/27/20: BSW still has not received a telephone call from [facility], [facility] called and left another voicemail. BSW contacted [facility], but they do not take patient's insurance. BSW contacted [facility] [phone] and was not able to leave a voicemail. BSW contacted [facility] [phone] and left a voicemail, BSW contacted [facility] [phone], however they do not accept patient's insurance. Patient stated she would like to go somewhere in [facility] but if she has to go outside of [place] then that is ok.  BSW contacted [facility] rep stated she missed her initial appointments and can no longer be a patient at any of the offices. UPDATE 02/26/21- Patient is still in need of counseling and psychiatry services. [facility] LCSW placed referral for both through the [facility].  Patient has  started experiencing severe insomnia. She reports ongoing depressive symptoms.  Patient Self Care Activities:  Performs ADL's independently Performs IADL's independently Ability for insight Motivation for treatment Strong family or social support  Patient Self Care Deficits:  Does not attend all scheduled provider appointments Former marijuana use Patient is a young, new mother. She was recently diagnosed with Covid after giving birth; she had mild symptoms and she had been vaccinated. Her newborn son was also diagnosed with Covid and was discharged on 08/28/2020. She has started back attending college as well, and she plans to go back to work once she is able.     Depression screen [facility] 2/9 02/26/2021 08/29/2020 07/25/2020 06/26/2020 06/12/2020  Decreased Interest _0 Down, Depressed, Hopeless _1 PHQ - 2 Score _2 Altered sleeping _3 Tired, decreased energy _4 Change in appetite _5 Feeling bad or failure about yourself  _6 Trouble concentrating _7 Moving slowly or fidgety/restless _8 Suicidal thoughts 0 2 0 0 0  PHQ-9 Score _9 Difficult doing work/chores Very difficult Extremely dIfficult Somewhat difficult Extremely dIfficult Extremely dIfficult  Some recent data might be hidden    Follow up:  Patient agrees to Care Plan and Follow-up.  Plan: The Managed Medicaid care management team will reach out to the patient again over the next 30 days.  Date and time of next scheduled Social Work care management/care coordination outreach:  03/11/21 at 11:00 am.   Eula Fried, BSW, MSW, [place] Medicaid LCSW [email or web address] Phone: [phone]

## 2021-02-27 ENCOUNTER — Telehealth: Payer: Medicaid Other | Admitting: Nurse Practitioner

## 2021-02-27 DIAGNOSIS — J029 Acute pharyngitis, unspecified: Secondary | ICD-10-CM

## 2021-02-27 MED ORDER — AMOXICILLIN 875 MG PO TABS: 875.0000 mg | ORAL_TABLET | Freq: Two times a day (BID) | ORAL | 0 refills | Status: DC

## 2021-02-27 NOTE — Progress Notes (Signed)
Virtual Visit Consent   Grace [name], you are scheduled for a virtual visit with Grace [name], [place], a [facility] provider, today.     Just as with appointments in the office, your consent must be obtained to participate.  Your consent will be active for this visit and any virtual visit you may have with one of our providers in the next 365 days.     If you have a MyChart account, a copy of this consent can be sent to you electronically.  All virtual visits are billed to your insurance company just like a traditional visit in the office.    As this is a virtual visit, video technology does not allow for your provider to perform a traditional examination.  This may limit your provider's ability to fully assess your condition.  If your provider identifies any concerns that need to be evaluated in person or the need to arrange testing (such as labs, EKG, etc.), we will make arrangements to do so.     Although advances in technology are sophisticated, we cannot ensure that it will always work on either your end or our end.  If the connection with a video visit is poor, the visit may have to be switched to a telephone visit.  With either a video or telephone visit, we are not always able to ensure that we have a secure connection.     I need to obtain your verbal consent now.   Are you willing to proceed with your visit today? YES   Grace [name] has provided verbal consent on 02/27/2021 for a virtual visit (video or telephone).   Grace Hassell Done, FNP   Date: 02/27/2021 2:11 PM   Virtual Visit via Video Note   I, Grace [name], connected with Grace [name] ([id number], November 15, 2000) on 02/27/21 at  2:15 PM EDT by a video-enabled telemedicine application and verified that I am speaking with the correct person using two identifiers.  Location: Patient: Virtual Visit Location Patient: Home Provider: Virtual Visit Location Provider: Mobile   I discussed the limitations of  evaluation and management by telemedicine and the availability of in person appointments. The patient expressed understanding and agreed to proceed.    History of Present Illness: Grace [name] is a 20 y.o. who identifies as a female who was assigned female at birth, and is being seen today for sore throat.  HPI: HPI  Patient stated she developed a sore throat on Sunday. She denies fever, cough or congestion. No myalgia. She has been taking tylenol but has not helped. Is worse today and by evening time she says swallowing really hurts. Has a tender not on right side of neck.  Problems:  Patient Active Problem List   Diagnosis Date Noted   Elevated blood pressure affecting pregnancy in third trimester, antepartum 07/25/2020   Labor and delivery indication for care or intervention 07/25/2020   Supervision of high risk pregnancy in third trimester 01/12/2020   Mood disorder ([place]) 11/15/2018   PTSD (post-traumatic stress disorder) 11/15/2018   Class 1 obesity due to excess calories without serious comorbidity with body mass index (BMI) of 30.0 to 30.9 in adult 11/15/2018   Neutropenia ([place]) 08/06/2018   Major depression, recurrent, chronic ([place]) 08/06/2018   GAD (generalized anxiety disorder) 08/06/2018   Leukopenia 11/11/2016   Allergy to nuts 09/15/2016   Panic attack 01/22/2016   Chronic allergic rhinitis 12/20/2015   Asthma, moderate persistent, poorly-controlled 12/20/2015   History of depression  12/20/2015   Primary dysmenorrhea 12/20/2015    Allergies: No Known Allergies Medications:  Current Outpatient Medications:    acetaminophen (TYLENOL) 325 MG tablet, Take 2 tablets (650 mg total) by mouth every 4 (four) hours as needed (for pain scale < 4)., Disp: , Rfl:    albuterol (VENTOLIN HFA) 108 (90 Base) MCG/ACT inhaler, Inhale 2 puffs into the lungs every 6 (six) hours as needed for wheezing or shortness of breath., Disp: 8 g, Rfl: 2   ARIPiprazole (ABILIFY) 5 MG tablet, Take 5 mg  by mouth daily. (Patient not taking: No sig reported), Disp: , Rfl:    Blood Pressure KIT, 1 kit by Does not apply route daily. Notify provider for blood pressures less than 90/50 or 160/110 or greater, Disp: 1 kit, Rfl: 0   cetirizine (ZYRTEC) 10 MG tablet, Take 1 tablet (10 mg total) by mouth daily., Disp: 90 tablet, Rfl: 1   cyclobenzaprine (FLEXERIL) 10 MG tablet, Take 1 tablet (10 mg total) by mouth every 8 (eight) hours as needed for muscle spasms. (Patient not taking: No sig reported), Disp: 30 tablet, Rfl: 0   famotidine (PEPCID) 40 MG tablet, Take by mouth. (Patient not taking: No sig reported), Disp: , Rfl:    ibuprofen (ADVIL) 600 MG tablet, Take 1 tablet (600 mg total) by mouth every 6 (six) hours as needed for mild pain or moderate pain. (Patient not taking: No sig reported), Disp: 60 tablet, Rfl: 1   meclizine (ANTIVERT) 25 MG tablet, , Disp: , Rfl:    medroxyPROGESTERone (DEPO-PROVERA) 150 MG/ML injection, Inject into the muscle., Disp: , Rfl:    montelukast (SINGULAIR) 10 MG tablet, Take 1 tablet (10 mg total) by mouth at bedtime., Disp: 90 tablet, Rfl: 1   NIFEdipine (ADALAT CC) 60 MG 24 hr tablet, Take 1 tablet (60 mg total) by mouth daily. (Patient not taking: No sig reported), Disp: 30 tablet, Rfl: 1   NIFEdipine (PROCARDIA XL/NIFEDICAL XL) 60 MG 24 hr tablet, Take 60 mg by mouth daily. (Patient not taking: Reported on 02/22/2021), Disp: , Rfl:    polyethylene glycol powder (GLYCOLAX/MIRALAX) 17 GM/SCOOP powder, Take 17 g by mouth daily. (Patient not taking: No sig reported), Disp: 3350 g, Rfl: 1   Prenatal Vit-Fe Fumarate-FA (PRENATAL MULTIVITAMIN) TABS tablet, Take 1 tablet by mouth daily at 12 noon., Disp: 100 tablet, Rfl: 1   sertraline (ZOLOFT) 100 MG tablet, Take 100 mg by mouth daily. (Patient not taking: Reported on 02/22/2021), Disp: , Rfl:    Spacer/Aero-Holding Chambers ([place]) DEVI, See admin instructions. use with inhaler (Patient not taking: No sig  reported), Disp: , Rfl: 0   STIMULANT LAXATIVE 8.6-50 MG tablet, Take 2 tablets by mouth at bedtime as needed. (Patient not taking: No sig reported), Disp: , Rfl:   Observations/Objective: Patient is well-developed, well-nourished in no acute distress.  Resting comfortably  at home.  Head is normocephalic, atraumatic.  No labored breathing.  Speech is clear and coherent with logical content.  Patient is alert and oriented at baseline.  Cannot visualize throat Tender nodule right anterior lymph chain.  Assessment and Plan:  ESTHA FEW in today with chief complaint of Sore Throat   1. Pharyngitis, unspecified etiology Force fluids Motrin or tylenol OTC OTC decongestant Throat lozenges if help New toothbrush in 3 days Meds ordered this encounter  Medications   amoxicillin (AMOXIL) 875 MG tablet    Sig: Take 1 tablet (875 mg total) by mouth 2 (two) times daily. 1  po BID    Dispense:  14 tablet    Refill:  0    Order Specific Question:   Supervising Provider    Answer:   Noemi Chapel [[id number]]        Follow Up Instructions: I discussed the assessment and treatment plan with the patient. The patient was provided an opportunity to ask questions and all were answered. The patient agreed with the plan and demonstrated an understanding of the instructions.  A copy of instructions were sent to the patient via MyChart.  The patient was advised to call back or seek an in-person evaluation if the symptoms worsen or if the condition fails to improve as anticipated.  Time:  I spent 10 minutes with the patient via telehealth technology discussing the above problems/concerns.    Grace Hassell Done, FNP

## 2021-02-28 ENCOUNTER — Telehealth: Payer: Self-pay | Admitting: Family Medicine

## 2021-02-28 NOTE — Telephone Encounter (Signed)
   Telephone encounter was:  Unsuccessful.  02/28/2021 Name: Grace [name] MRN: [id number] DOB: 07-27-2001  Unsuccessful outbound call made today to assist with:   finding a PCP that takes [place] Medicaid .  Outreach Attempt:  1st Attempt  A HIPAA compliant voice message was left requesting a return call.  Instructed patient to call back at [phone].  April Green Care Guide, Embedded Care Coordination [facility], Care Management Phone: [phone] Email: [email or web address]

## 2021-03-05 ENCOUNTER — Telehealth: Payer: Self-pay | Admitting: Family Medicine

## 2021-03-05 ENCOUNTER — Ambulatory Visit: Payer: Medicaid Other

## 2021-03-05 ENCOUNTER — Telehealth: Payer: Medicaid Other | Admitting: Physician Assistant

## 2021-03-05 DIAGNOSIS — R197 Diarrhea, unspecified: Secondary | ICD-10-CM | POA: Diagnosis not present

## 2021-03-05 NOTE — Progress Notes (Signed)
Virtual Visit Consent   Grace [name], you are scheduled for a virtual visit with a [facility] provider today.     Just as with appointments in the office, your consent must be obtained to participate.  Your consent will be active for this visit and any virtual visit you may have with one of our providers in the next 365 days.     If you have a MyChart account, a copy of this consent can be sent to you electronically.  All virtual visits are billed to your insurance company just like a traditional visit in the office.    As this is a virtual visit, video technology does not allow for your provider to perform a traditional examination.  This may limit your provider's ability to fully assess your condition.  If your provider identifies any concerns that need to be evaluated in person or the need to arrange testing (such as labs, EKG, etc.), we will make arrangements to do so.     Although advances in technology are sophisticated, we cannot ensure that it will always work on either your end or our end.  If the connection with a video visit is poor, the visit may have to be switched to a telephone visit.  With either a video or telephone visit, we are not always able to ensure that we have a secure connection.     I need to obtain your verbal consent now.   Are you willing to proceed with your visit today?    Grace [name] has provided verbal consent on 03/05/2021 for a virtual visit (video or telephone).   Leeanne Rio, [place]   Date: 03/05/2021 2:20 PM   Virtual Visit via Video Note   I, Leeanne Rio, connected with  Grace [name]  ([id number], 09-Dec-2000) on 03/05/21 at  2:00 PM EDT by a video-enabled telemedicine application and verified that I am speaking with the correct person using two identifiers.  Location: Patient: Virtual Visit Location Patient: Home Provider: Virtual Visit Location Provider: Home Office   I discussed the limitations of evaluation and management by  telemedicine and the availability of in person appointments. The patient expressed understanding and agreed to proceed.    History of Present Illness: Grace [name] is a 20 y.o. who identifies as a female who was assigned female at birth, and is being seen today for loose stools over the past 7-9 days. Notes initially loose stools with some abdominal cramping and nausea, maybe 4 stools per day. Now still having lingering loose caliber of stool. Denies melena, hematochezia or tenesmus. Denies recent travel. Has had family member with similar symptoms. Of note, she was also seen on 7/6 and started on amoxicillin for pharyngitis which she took for a few days before stopping due to diarrhea. Denies any residual throat or URI symptoms. Has also been trying to fast for the past couple of days, maintaining only a liquid diet.     HPI: HPI  Problems:  Patient Active Problem List   Diagnosis Date Noted   Elevated blood pressure affecting pregnancy in third trimester, antepartum 07/25/2020   Labor and delivery indication for care or intervention 07/25/2020   Supervision of high risk pregnancy in third trimester 01/12/2020   Mood disorder ([place]) 11/15/2018   PTSD (post-traumatic stress disorder) 11/15/2018   Class 1 obesity due to excess calories without serious comorbidity with body mass index (BMI) of 30.0 to 30.9 in adult 11/15/2018   Neutropenia ([place])  08/06/2018   Major depression, recurrent, chronic (HCC) 08/06/2018   GAD (generalized anxiety disorder) 08/06/2018   Leukopenia 11/11/2016   Allergy to nuts 09/15/2016   Panic attack 01/22/2016   Chronic allergic rhinitis 12/20/2015   Asthma, moderate persistent, poorly-controlled 12/20/2015   History of depression 12/20/2015   Primary dysmenorrhea 12/20/2015    Allergies: No Known Allergies Medications:  Current Outpatient Medications:    acetaminophen (TYLENOL) 325 MG tablet, Take 2 tablets (650 mg total) by mouth every 4 (four) hours as  needed (for pain scale < 4)., Disp: , Rfl:    albuterol (VENTOLIN HFA) 108 (90 Base) MCG/ACT inhaler, Inhale 2 puffs into the lungs every 6 (six) hours as needed for wheezing or shortness of breath., Disp: 8 g, Rfl: 2   amoxicillin (AMOXIL) 875 MG tablet, Take 1 tablet (875 mg total) by mouth 2 (two) times daily. 1 po BID, Disp: 14 tablet, Rfl: 0   ARIPiprazole (ABILIFY) 5 MG tablet, Take 5 mg by mouth daily. (Patient not taking: No sig reported), Disp: , Rfl:    Blood Pressure KIT, 1 kit by Does not apply route daily. Notify provider for blood pressures less than 90/50 or 160/110 or greater, Disp: 1 kit, Rfl: 0   cetirizine (ZYRTEC) 10 MG tablet, Take 1 tablet (10 mg total) by mouth daily., Disp: 90 tablet, Rfl: 1   medroxyPROGESTERone (DEPO-PROVERA) 150 MG/ML injection, Inject into the muscle., Disp: , Rfl:    montelukast (SINGULAIR) 10 MG tablet, Take 1 tablet (10 mg total) by mouth at bedtime., Disp: 90 tablet, Rfl: 1   NIFEdipine (ADALAT CC) 60 MG 24 hr tablet, Take 1 tablet (60 mg total) by mouth daily. (Patient not taking: No sig reported), Disp: 30 tablet, Rfl: 1   NIFEdipine (PROCARDIA XL/NIFEDICAL XL) 60 MG 24 hr tablet, Take 60 mg by mouth daily. (Patient not taking: Reported on 02/22/2021), Disp: , Rfl:    Prenatal Vit-Fe Fumarate-FA (PRENATAL MULTIVITAMIN) TABS tablet, Take 1 tablet by mouth daily at 12 noon., Disp: 100 tablet, Rfl: 1   sertraline (ZOLOFT) 100 MG tablet, Take 100 mg by mouth daily. (Patient not taking: Reported on 02/22/2021), Disp: , Rfl:    Spacer/Aero-Holding Chambers ([place]) DEVI, See admin instructions. use with inhaler (Patient not taking: No sig reported), Disp: , Rfl: 0  Observations/Objective: Patient is well-developed, well-nourished in no acute distress.  Resting comfortably at home.  Head is normocephalic, atraumatic.  No labored breathing. Speech is clear and coherent with logical content.  Patient is alert and oriented at baseline.    Assessment and Plan: 1. Diarrhea, unspecified type Feel initially patient was suffering from a mild viral gastroenteritis. Now with residual lingering symptoms due to antibiotic use and liquid diet. No alarm signs/symptoms present. Recommend she continue good hydration but will restart solids following BRAT diet. Handout sent to MyChart. Ok to continue imodium as needed. She is to start a daily probiotic. Although do not think she is having a bout of c.diff diarrhea, if symptoms are not resolving with measures discussed or if anything worsens/new symptoms develop, she is to seek in person evaluation ASAP.   Follow Up Instructions: I discussed the assessment and treatment plan with the patient. The patient was provided an opportunity to ask questions and all were answered. The patient agreed with the plan and demonstrated an understanding of the instructions.  A copy of instructions were sent to the patient via MyChart.  The patient was advised to call back or seek an  in-person evaluation if the symptoms worsen or if the condition fails to improve as anticipated.  Time:  I spent 12 minutes with the patient via telehealth technology discussing the above problems/concerns.    Leeanne Rio, PA-C

## 2021-03-05 NOTE — Telephone Encounter (Signed)
   Telephone encounter was:  Successful.  03/05/2021 Name: Grace [name] MRN: [id number] DOB: [birth year]  [name] is a 20 y.o. year old female who is a primary care patient of Alba Cory, MD . The community resource team was consulted for assistance with  finding a new PCP close to her home that accepts [place] Medicaid.   Care guide performed the following interventions: Patient provided with information about care guide support team and interviewed to confirm resource needs Pt returned my call and asked if I could find out if [facility] on [address] in [place] accepted [place] Medicaid. I called and confirmed they do and sent pt email with their information to call and make an appointment .  Follow Up Plan:  No further follow up planned at this time. The patient has been provided with needed resources.  April Green Care Guide, Embedded Care Coordination [facility], Care Management Phone: [phone] Email: [email or web address]

## 2021-03-05 NOTE — Patient Instructions (Signed)
Grace [name], thank you for joining Leeanne Rio, PA-C for today's virtual visit.  While this provider is not your primary care provider (PCP), if your PCP is located in our provider database this encounter information will be shared with them immediately following your visit.  Consent: (Patient) Grace [name] provided verbal consent for this virtual visit at the beginning of the encounter.  Current Medications:  Current Outpatient Medications:    acetaminophen (TYLENOL) 325 MG tablet, Take 2 tablets (650 mg total) by mouth every 4 (four) hours as needed (for pain scale < 4)., Disp: , Rfl:    albuterol (VENTOLIN HFA) 108 (90 Base) MCG/ACT inhaler, Inhale 2 puffs into the lungs every 6 (six) hours as needed for wheezing or shortness of breath., Disp: 8 g, Rfl: 2   amoxicillin (AMOXIL) 875 MG tablet, Take 1 tablet (875 mg total) by mouth 2 (two) times daily. 1 po BID, Disp: 14 tablet, Rfl: 0   ARIPiprazole (ABILIFY) 5 MG tablet, Take 5 mg by mouth daily. (Patient not taking: No sig reported), Disp: , Rfl:    Blood Pressure KIT, 1 kit by Does not apply route daily. Notify provider for blood pressures less than 90/50 or 160/110 or greater, Disp: 1 kit, Rfl: 0   cetirizine (ZYRTEC) 10 MG tablet, Take 1 tablet (10 mg total) by mouth daily., Disp: 90 tablet, Rfl: 1   cyclobenzaprine (FLEXERIL) 10 MG tablet, Take 1 tablet (10 mg total) by mouth every 8 (eight) hours as needed for muscle spasms. (Patient not taking: No sig reported), Disp: 30 tablet, Rfl: 0   famotidine (PEPCID) 40 MG tablet, Take by mouth. (Patient not taking: No sig reported), Disp: , Rfl:    ibuprofen (ADVIL) 600 MG tablet, Take 1 tablet (600 mg total) by mouth every 6 (six) hours as needed for mild pain or moderate pain. (Patient not taking: No sig reported), Disp: 60 tablet, Rfl: 1   meclizine (ANTIVERT) 25 MG tablet, , Disp: , Rfl:    medroxyPROGESTERone (DEPO-PROVERA) 150 MG/ML injection, Inject into the muscle., Disp: ,  Rfl:    montelukast (SINGULAIR) 10 MG tablet, Take 1 tablet (10 mg total) by mouth at bedtime., Disp: 90 tablet, Rfl: 1   NIFEdipine (ADALAT CC) 60 MG 24 hr tablet, Take 1 tablet (60 mg total) by mouth daily. (Patient not taking: No sig reported), Disp: 30 tablet, Rfl: 1   NIFEdipine (PROCARDIA XL/NIFEDICAL XL) 60 MG 24 hr tablet, Take 60 mg by mouth daily. (Patient not taking: Reported on 02/22/2021), Disp: , Rfl:    polyethylene glycol powder (GLYCOLAX/MIRALAX) 17 GM/SCOOP powder, Take 17 g by mouth daily. (Patient not taking: No sig reported), Disp: 3350 g, Rfl: 1   Prenatal Vit-Fe Fumarate-FA (PRENATAL MULTIVITAMIN) TABS tablet, Take 1 tablet by mouth daily at 12 noon., Disp: 100 tablet, Rfl: 1   sertraline (ZOLOFT) 100 MG tablet, Take 100 mg by mouth daily. (Patient not taking: Reported on 02/22/2021), Disp: , Rfl:    Spacer/Aero-Holding Chambers ([place]) DEVI, See admin instructions. use with inhaler (Patient not taking: No sig reported), Disp: , Rfl: 0   STIMULANT LAXATIVE 8.6-50 MG tablet, Take 2 tablets by mouth at bedtime as needed. (Patient not taking: No sig reported), Disp: , Rfl:    Medications ordered in this encounter:  No orders of the defined types were placed in this encounter.    *If you need refills on other medications prior to your next appointment, please contact your pharmacy*  Follow-Up:  Call back or seek an in-person evaluation if the symptoms worsen or if the condition fails to improve as anticipated.  Other Instructions Viral Gastroenteritis, Adult  Viral gastroenteritis is also known as the stomach flu. This condition may affect your stomach, your small intestine, and your large intestine. It can cause sudden watery poop (diarrhea), fever, and throwing up (vomiting). This condition is caused by certain germs (viruses). These germs can be passed from person to person very easily (are contagious). Having watery poop and throwing up can make you feel  weak and cause you to not have enough water in your body (get dehydrated). This can make you tired and thirsty, make you have a dry mouth, and make it so you pee (urinate) less often. It is important to replace the fluids that you lose from havingwatery poop and throwing up. What are the causes? You can get sick by catching viruses from other people. You can also get sick by: Eating food, drinking water, or touching a surface that has the viruses on it (is contaminated). Sharing utensils or other personal items with a person who is sick. What increases the risk? Having a weak body defense system (immune system). Living with one or more children who are younger than 57 years old. Living in a nursing home. Going on cruise ships. What are the signs or symptoms? Symptoms of this condition start suddenly. Symptoms may last for a few days or for as long as a week. Common symptoms include: Watery poop. Throwing up. Other symptoms include: Fever. Headache. Feeling tired (fatigue). Pain in the belly (abdomen). Chills. Feeling weak. Feeling sick to your stomach (nauseous). Muscle aches. Not feeling hungry. How is this treated? This condition typically goes away on its own. The focus of treatment is to replace the fluids that you lose. This condition may be treated with: An ORS (oral rehydration solution). This is a drink that is sold at pharmacies and stores. Medicines to help with your symptoms. Probiotic supplements to reduce symptoms of diarrhea. Fluids given through an IV tube, if needed. Older adults and people with other diseases or a weak body defense system are at higher risk for not having enough water in the body. Follow these instructions at home: Eating and drinking  Take an ORS as told by your doctor. Drink clear fluids in small amounts as you are able. Clear fluids include: Water. Ice chips. Fruit juice with water added to it (diluted). Low-calorie sports drinks. Drink  enough fluid to keep your pee (urine) pale yellow. Eat small amounts of healthy foods every 3-4 hours as you are able. This may include whole grains, fruits, vegetables, lean meats, and yogurt. Avoid fluids that have a lot of sugar or caffeine in them, such as energy drinks, sports drinks, and soda. Avoid spicy or fatty foods. Avoid alcohol.  General instructions  Wash your hands often. This is very important after you have watery poop or you throw up. If you cannot use soap and water, use hand sanitizer. Make sure that all people in your home wash their hands well and often. Take over-the-counter and prescription medicines only as told by your doctor. Rest at home while you get better. Watch your condition for any changes. Take a warm bath to help with any burning or pain from having watery poop. Keep all follow-up visits as told by your doctor. This is important.  Contact a doctor if: You cannot keep fluids down. Your symptoms get worse. You have new symptoms. You  feel light-headed. You feel dizzy. You have muscle cramps. Get help right away if: You have chest pain. You feel very weak. You pass out (faint). You see blood in your throw-up. Your throw-up looks like coffee grounds. You have bloody or black poop (stools) or poop that looks like tar. You have a very bad headache, or a stiff neck, or both. You have a rash. You have very bad pain, cramping, or bloating in your belly. You have trouble breathing. You are breathing very quickly. You have a fast heartbeat. Your skin feels cold and clammy. You feel mixed up (confused). You have pain when you pee. You have signs of not having enough water in the body, such as: Dark pee, hardly any pee, or no pee. Cracked lips. Dry mouth. Sunken eyes. Feeling very sleepy. Feeling weak. Summary Viral gastroenteritis is also known as the stomach flu. This condition can cause sudden watery poop (diarrhea), fever, and throwing up  (vomiting). These germs can be passed from person to person very easily. Take an ORS as told by your doctor. This is a drink that is sold at pharmacies and stores. Drink fluids in small amounts many times each day as you are able. This information is not intended to replace advice given to you by your health care provider. Make sure you discuss any questions you have with your healthcare provider. Document Revised: 06/16/2018 Document Reviewed: 06/16/2018 Elsevier Patient Education  2022 [place] Diet A bland diet consists of foods that are often soft and do not have a lot of fat, fiber, or extra seasonings. Foods without fat, fiber, or seasoning are easier for the body to digest. They are also less likely to irritate your mouth, throat, stomach, and other parts of your digestive system. A bland dietis sometimes called a BRAT diet. What is my plan? Your health care provider or food and nutrition specialist (dietitian) may recommend specific changes to your diet to prevent symptoms or to treat your symptoms. These changes may include: Eating small meals often. Cooking food until it is soft enough to chew easily. Chewing your food well. Drinking fluids slowly. Not eating foods that are very spicy, sour, or fatty. Not eating citrus fruits, such as oranges and grapefruit. What do I need to know about this diet? Eat a variety of foods from the bland diet food list. Do not follow a bland diet longer than needed. Ask your health care provider whether you should take vitamins or supplements. What foods can I eat? Grains  Hot cereals, such as cream of wheat. Rice. Bread, crackers, or tortillas madefrom refined white flour. Vegetables Canned or cooked vegetables. Mashed or boiled potatoes. Fruits  Bananas. Applesauce. Other types of cooked or canned fruit with the skin andseeds removed, such as canned peaches or pears. Meats and other proteins  Scrambled eggs. Creamy peanut  butter or other nut butters. Lean, well-cookedmeats, such as chicken or fish. Tofu. Soups or broths. Dairy Low-fat dairy products, such as milk, cottage cheese, or yogurt. Beverages  Water. Herbal tea. Apple juice. Fats and oils Mild salad dressings. Canola or olive oil. Sweets and desserts Pudding. Custard. Fruit gelatin. Ice cream. The items listed above may not be a complete list of recommended foods and beverages. Contact a dietitian for more options. What foods are not recommended? Grains Whole grain breads and cereals. Vegetables Raw vegetables. Fruits Raw fruits, especially citrus, berries, or dried fruits. Dairy Whole fat dairy foods. Beverages Caffeinated drinks. Alcohol. Seasonings and  condiments Strongly flavored seasonings or condiments. Hot sauce. Salsa. Other foods Spicy foods. Fried foods. Sour foods, such as pickled or fermented foods. Foodswith high sugar content. Foods high in fiber. The items listed above may not be a complete list of foods and beverages to avoid. Contact a dietitian for more information. Summary A bland diet consists of foods that are often soft and do not have a lot of fat, fiber, or extra seasonings. Foods without fat, fiber, or seasoning are easier for the body to digest. Check with your health care provider to see how long you should follow this diet plan. It is not meant to be followed for long periods. This information is not intended to replace advice given to you by your health care provider. Make sure you discuss any questions you have with your healthcare provider. Document Revised: 09/09/2017 Document Reviewed: 09/09/2017 Elsevier Patient Education  2022 [employer].    If you have been instructed to have an in-person evaluation today at a local Urgent Care facility, please use the link below. It will take you to a list of all of our available [facility], including address, phone number and hours of operation. Please  do not delay care.  [facility]  If you or a family member do not have a primary care provider, use the link below to schedule a visit and establish care. When you choose a [facility] primary care physician or advanced practice provider, you gain a long-term partner in health. Find a Primary Care Provider  Learn more about [facility]'s in-office and virtual care options: [facility]

## 2021-03-11 ENCOUNTER — Other Ambulatory Visit: Payer: Self-pay | Admitting: Licensed Clinical Social Worker

## 2021-03-11 NOTE — Patient Instructions (Signed)
Visit Information  Ms. Dula was given information about Medicaid Managed Care team care coordination services as a part of their [facility] Medicaid benefit. ILIANA HUTT verbally consented to engagement with the [facility] Managed Care team.   For questions related to your [facility] health plan, please call: [phone] or go here:[email or web address]  If you would like to schedule transportation through your [facility] plan, please call the following number at least 2 days in advance of your appointment: [phone].  Call the [place] Crisis Line at [phone], at any time, 24 hours a day, 7 days a week. If you are in danger or need immediate medical attention call 911.  If you would like help to quit smoking, call 1-800-QUIT-NOW (941-309-5697) OR Espaol: 1-855-Djelo-Ya (6-546-503-5465) o para ms informacin haga clic aqu or Text READY to 681-275 to register via text  Ms. Keena - following are the goals we discussed in your visit today:   Goals Addressed             This Visit's Progress    Attend Therapy to Help Me Manage My Emotions       Timeframe:  Long-Range Goal Priority:  High Start Date:    02/26/21                     Expected End Date:  ongoing                    Follow Up Date- 03/29/21   Patient will:   - Answer [facility]'s call to initiate services (completed) Attend therapy and medication management appointments once scheduled through [facility].  - Take medications as prescribed.  - Actively participate in sessions.  - Report changes to providers, such as if she feels the medication is not working       [employer], MSW, [employer] Managed Medicaid LCSW [employer]  [email or web address] Phone: [phone]

## 2021-03-11 NOTE — Patient Outreach (Signed)
Medicaid Managed Care Social Work Note  03/11/2021 Name:  Grace [name] MRN:  [id number] DOB:  [birth year]  Grace [name] is an 20 y.o. year old female who is a primary patient of Grace Sizer, MD.  The Medicaid Managed Care Coordination team was consulted for assistance with:  [place] and Resources  [name] was given information about Medicaid Managed CareCoordination services today. Grace [name] agreed to services and verbal consent obtained.  Engaged with patient  for by telephone forfollow up visit in response to referral for case management and/or care coordination services.   Assessments/Interventions:  Review of past medical history, allergies, medications, health status, including review of consultants reports, laboratory and other test data, was performed as part of comprehensive evaluation and provision of chronic care management services.  SDOH: (Social Determinant of Health) assessments and interventions performed: SDOH Interventions    Flowsheet Row Most Recent Value  SDOH Interventions   Stress Interventions Provide Counseling  Social Connections Interventions Intervention Not Indicated  Depression Interventions/Treatment  Referral to Psychiatry, Currently on Treatment, Counseling, Medication       Advanced Directives Status:  Not addressed in this encounter.  Care Plan                 No Known Allergies  Medications Reviewed Today     Reviewed by Grace [name] (Physician Assistant Certified) on 27/07/86 at 1423  Med List Status: <None>   Medication Order Taking? Sig Documenting Provider Last Dose Status Informant  acetaminophen (TYLENOL) 325 MG tablet [id number] No Take 2 tablets (650 mg total) by mouth every 4 (four) hours as needed (for pain scale < 4). Grace [name], CNM Taking Active   albuterol (VENTOLIN HFA) 108 (90 Base) MCG/ACT inhaler [id number] No Inhale 2 puffs into the lungs every 6 (six) hours as needed for wheezing or  shortness of breath. Grace Salvage, PA Taking Active   amoxicillin (AMOXIL) 875 MG tablet [id number]  Take 1 tablet (875 mg total) by mouth 2 (two) times daily. 1 po BID Grace [name], Grace [name]  Active   ARIPiprazole (ABILIFY) 5 MG tablet [id number] No Take 5 mg by mouth daily.  Patient not taking: No sig reported   [provider] Not Taking Active   Blood Pressure KIT [id number] No 1 kit by Does not apply route daily. Notify provider for blood pressures less than 90/50 or 160/110 or greater Grace [name], CNM Taking Active   cetirizine (ZYRTEC) 10 MG tablet [id number] No Take 1 tablet (10 mg total) by mouth daily. Grace Sizer, MD Taking Active   Patient not taking:  Discontinued 03/05/21 1404   Patient not taking:  Discontinued 03/05/21 1404   Patient not taking:  Discontinued 03/05/21 1404     Patient not taking:  Discontinued 03/05/21 1404   medroxyPROGESTERone (DEPO-PROVERA) 150 MG/ML injection [id number] No Inject into the muscle. [provider] Taking Active   montelukast (SINGULAIR) 10 MG tablet [id number] No Take 1 tablet (10 mg total) by mouth at bedtime. Grace Sizer, MD Taking Active   NIFEdipine (ADALAT CC) 60 MG 24 hr tablet [id number] No Take 1 tablet (60 mg total) by mouth daily.  Patient not taking: No sig reported   Grace [name], CNM Not Taking Active   NIFEdipine (PROCARDIA XL/NIFEDICAL XL) 60 MG 24 hr tablet [id number] No Take 60 mg by mouth daily.  Patient not taking: Reported on 02/22/2021   [provider] Not Taking Active   Patient  not taking:  Discontinued 03/05/21 1404   Prenatal Vit-Fe Fumarate-FA (PRENATAL MULTIVITAMIN) TABS tablet [id number] No Take 1 tablet by mouth daily at 12 noon. Grace Sizer, MD Taking Active   sertraline (ZOLOFT) 100 MG tablet [id number] No Take 100 mg by mouth daily.  Patient not taking: Reported on 02/22/2021   [provider] Not Taking Active Self  Spacer/Aero-Holding Chambers ([place]) DEVI [id number] No See admin instructions. use with inhaler  Patient not taking: No sig reported   [provider] Not Taking Active   Patient not taking:  Discontinued 03/05/21 1404   Med List Note Grace [name], [facility] 11/04/19 9450): Pt has been out of most meds for a few weeks            Patient Active Problem List   Diagnosis Date Noted   Elevated blood pressure affecting pregnancy in third trimester, antepartum 07/25/2020   Labor and delivery indication for care or intervention 07/25/2020   Supervision of high risk pregnancy in third trimester 01/12/2020   Mood disorder ([place]) 11/15/2018   PTSD (post-traumatic stress disorder) 11/15/2018   Class 1 obesity due to excess calories without serious comorbidity with body mass index (BMI) of 30.0 to 30.9 in adult 11/15/2018   Neutropenia ([place]) 08/06/2018   Major depression, recurrent, chronic ([place]) 08/06/2018   GAD (generalized anxiety disorder) 08/06/2018   Leukopenia 11/11/2016   Allergy to nuts 09/15/2016   Panic attack 01/22/2016   Chronic allergic rhinitis 12/20/2015   Asthma, moderate persistent, poorly-controlled 12/20/2015   History of depression 12/20/2015   Primary dysmenorrhea 12/20/2015    Conditions to be addressed/monitored per PCP order:  Anxiety and Depression  Care Plan : General Social Work (Adult)  Updates made by Grace Cutter, LCSW since 03/11/2021 12:00 AM     Problem: Symptoms (Depression)      Long-Range Goal: Depression and Insomnia Symptoms Monitored and Managed   Start Date: 02/26/2021  Recent Progress: On track  Priority: High  Note:   Timeframe:  Long-Range Goal Priority:  High Start Date:    02/26/21                     Expected End Date:  ongoing                    Follow Up Date- 03/29/21  Current Barriers:  Chronic Mental Health needs related to depression and anxiety and past trauma experiences. Financial constraints related to  unemployment. Transportation Substance abuse issues -  Patient reports she smoked marijuana until she was about 4 months pregnant. She also smoked cigarettes but quit recently. She now vapes. Lacks knowledge of community resource: Patient was receiving outpatient therapy at [facility], but her services were terminated because they do not accept her Medicaid plan. [facility] referral made on 02/26/21 for therapy and medication management per patient's permission. Update 03/11/21- [facility] set up patient successfully with [facility] and has already completed one therapy session and one medication management appointment. Patient was started on seroquel and has follow up appointments for both services. Patient's next counseling session is on 03/19/21 and patient's medication management appointment is set for 03/27/21.  Suicidal Ideation/Homicidal Ideation: No  Clinical Social Work Goal(s):  Over the next 90 days, patient will work with SW monthly by telephone or in person to reduce or manage symptoms related to depression, anxiety and past trauma. Over the next 60 days, patient will work with SW to  address concerns related to depression and anxiety Over the next 60 days, patient will work with SW to address needs related to scheduling appointment with appropriate outpatient providers for follow up.   Interventions: Patient interviewed and appropriate assessments performed: PHQ 2 and PHQ 9 Patient interviewed and appropriate assessments performed Provided mental health counseling with regard to depression and being a young mother. Patient reported that "everything is stressing her." She denied SI/HI/AVH. LCSW counseled patient about being a new parent and pacing herself with tasks she has set for herself, and cautioned against feeling like a failure if she doesn't do everything in the exact order she would like. LCSW discussed coping skills and interventions for her to utilize whenever she feels  stressed. Provided patient with information about caring for self and utilizing support system after childbirth. Discussed plans with patient for ongoing care management follow up and provided patient with direct contact information for care management team Collaborated with RN Case Manager re: outpatient therapy and medication management needs Collaborated with BSW re: scheduling appointment with [facility] [phone]. Brief CBT, Emotional/Supportive Counseling, Psychoeducation and/or Health Education, and Referred to counselor/psychotherapist BSW contacted [facility] to make a new patient appointment. BSW was informed that a referral has to be submitted. BSW will send referral. Referral sent on 08/06/20. Update 08/29/2020: Patient stated she has not received a call from [facility] to schedule appointments. LCSW will collaborate with BSW regarding scheduling. LCSW will consider referring to another agency if unable to get patient scheduled [facility] [phone] or [phone]). BSW contacted [facility] and scheduled patient for an appointment on 09/13/20 at [facility] contacted patient to inform her of her therapy appointment on 09/13/20 at 2pm. Patient stated she would go to the appointment and asked for an email for a reminder and for the address. Patient's email [email or web address]. Update 09/20/20: Patient stated she had to reschedule her appointment with [facility], when the snow came she tried to reschedule, but they told her they would have to enter it into the system as a no show and she would have to find somewhere else to go. BSW contacted [facility] to check on referral placed on 08/06/20, BSW left a voicemail for a telephone call back. BSW will wait until Friday 09/21/20 for a telephone call back before finding patient somewhere else to go. Update 09/27/20: BSW still  has not received a telephone call from [facility], [facility] called and left another voicemail. BSW contacted [facility], but they do not take patient's insurance. BSW contacted [facility] [phone] and was not able to leave a voicemail. BSW contacted [facility] [phone] and left a voicemail, BSW contacted [facility] [phone], however they do not accept patient's insurance. Patient stated she would like to go somewhere in [facility] but if she has to go outside of [place] then that is ok.  BSW contacted [facility] rep stated she missed her initial appointments and can no longer be a patient at any of the offices. UPDATE 02/26/21- Patient is still in need of counseling and psychiatry services. [facility] LCSW placed referral for both through the [facility].  Patient has started experiencing severe insomnia. She reports ongoing depressive symptoms.  Patient Self Care Activities:  Performs ADL's independently Performs IADL's independently Ability for insight Motivation for treatment Strong family or social support  Patient Self Care Deficits:  Does not attend all scheduled provider appointments Former marijuana  use Patient is a young, new mother. She was recently diagnosed with Covid after giving birth; she had mild symptoms and she had been vaccinated. Her newborn son was also diagnosed with Covid and was discharged on 08/28/2020. She has started back attending college as well, and she plans to go back to work once she is able.      Follow up:  Patient agrees to Care Plan and Follow-up.  Plan: The Managed Medicaid care management team will reach out to the patient again over the next 30 days.  Date of next scheduled Social Work care management/care coordination outreach:  03/29/21  Eula Fried, BSW, MSW, LCSW Managed Medicaid LCSW [email or web address] Phone: [phone]

## 2021-03-21 ENCOUNTER — Encounter (INDEPENDENT_AMBULATORY_CARE_PROVIDER_SITE_OTHER): Payer: Self-pay

## 2021-03-21 ENCOUNTER — Telehealth: Payer: Medicaid Other | Admitting: Physician Assistant

## 2021-03-21 ENCOUNTER — Telehealth: Payer: Self-pay

## 2021-03-21 DIAGNOSIS — U071 COVID-19: Secondary | ICD-10-CM | POA: Diagnosis not present

## 2021-03-21 MED ORDER — BENZONATATE 100 MG PO CAPS: 100.0000 mg | ORAL_CAPSULE | Freq: Three times a day (TID) | ORAL | 0 refills | Status: DC | PRN

## 2021-03-21 MED ORDER — FLUTICASONE PROPIONATE 50 MCG/ACT NA SUSP: 2.0000 | Freq: Every day | NASAL | 6 refills | Status: DC

## 2021-03-21 NOTE — Patient Instructions (Signed)
Hello Grace [name],  You are being placed in the home monitoring program for COVID-19 (commonly known as Coronavirus).  This is because you are suspected to have the virus or are known to have the virus.  If you are unsure which group you fall into call your clinic.    As part of this program, you'll answer a daily questionnaire in the MyChart mobile app. You'll receive a notification through the MyChart app when the questionnaire is available. When you log in to MyChart, you'll see the tasks in your To Do activity.       Clinicians will see any answers that are concerning and take appropriate steps.  If at any point you are having a medical emergency, call 911.  If otherwise concerned call your clinic instead of coming into the clinic or hospital.  To keep from spreading the disease you should: Stay home and limit contact with other people as much as possible.  Wash your hands frequently. Cover your coughs and sneezes with a tissue, and throw used tissues in the trash.   Clean and disinfect frequently touched surfaces and objects.    Take care of yourself by: Staying home Resting Drinking fluids Take fever-reducing medications (Tylenol/Acetaminophen and Ibuprofen)  For more information on the disease go to the Centers for Disease Control and Prevention website     COVID-19: What to Do if You Are Sick CDC has updated isolation and quarantine recommendations for the public, and is revising the CDC website to reflect these changes. These recommendations do not apply to healthcare personnel and do not supersede state, local, tribal, or territorial laws, rules, andregulations. If you have a fever, cough or other symptoms, you might have COVID-19. Most people have mild illness and are able to recover at home. If you are sick: Keep track of your symptoms. If you have an emergency warning sign (including trouble breathing), call 911. Steps to help prevent the spread of COVID-19 if you are sick If  you are sick with COVID-19 or think you might have COVID-19, follow the steps below to care for yourself and to help protect other peoplein your home and community. Stay home except to get medical care Stay home. Most people with COVID-19 have mild illness and can recover at home without medical care. Do not leave your home, except to get medical care. Do not visit public areas. Take care of yourself. Get rest and stay hydrated. Take over-the-counter medicines, such as acetaminophen, to help you feel better. Stay in touch with your doctor. Call before you get medical care. Be sure to get care if you have trouble breathing, or have any other emergency warning signs, or if you think it is an emergency. Avoid public transportation, ride-sharing, or taxis. Separate yourself from other people As much as possible, stay in a specific room and away from other people and pets in your home. If possible, you should use a separate bathroom. If you need to be around other people or animals in oroutside of the home, wear a mask. Tell your close contactsthat they may have been exposed to COVID-19. An infected person can spread COVID-19 starting 48 hours (or 2 days) before the person has any symptoms or tests positive. By letting your close contacts know they may have been exposed to COVID-19, you are helping to protect everyone. Additional guidance is available for those living in close quarters and shared housing. See COVID-19 and Animals if you have questions about pets. If you are diagnosed with  COVID-19, someone from the health department may call you. Answer the call to slow the spread. Monitor your symptoms Symptoms of COVID-19 include fever, cough, or other symptoms. Follow care instructions from your healthcare provider and local health department. Your local health authorities may give instructions on checking your symptoms and reporting information. When to seek emergency medical attention Look for  emergency warning signs* for COVID-19. If someone is showing any of these signs, seek emergency medical care immediately: Trouble breathing Persistent pain or pressure in the chest New confusion Inability to wake or stay awake Pale, gray, or blue-colored skin, lips, or nail beds, depending on skin tone *This list is not all possible symptoms. Please call your medical provider forany other symptoms that are severe or concerning to you. Call 911 or call ahead to your local emergency facility: Notify the operator that you are seeking care for someone who has or may haveCOVID-19. Call ahead before visiting your doctor Call ahead. Many medical visits for routine care are being postponed or done by phone or telemedicine. If you have a medical appointment that cannot be postponed, call your doctor's office, and tell them you have or may have COVID-19. This will help the office protect themselves and other patients. Get tested If you have symptoms of COVID-19, get tested. While waiting for test results, you stay away from others, including staying apart from those living in your household. Self-tests are one of several options for testing for the virus that causes COVID-19 and may be more convenient than laboratory-based tests and point-of-care tests. Ask your healthcare provider or your local health department if you need help interpreting your test results. You can visit your state, tribal, local, and territorial health department's website to look for the latest local information on testing sites. If you are sick, wear a mask over your nose and mouth You should wear a mask over your nose and mouth if you must be around other people or animals, including pets (even at home). You don't need to wear the mask if you are alone. If you can't put on a mask (because of trouble breathing, for example), cover your coughs and sneezes in some other way. Try to stay at least 6 feet away from other people. This will  help protect the people around you. Masks should not be placed on young children under age 2 years, anyone who has trouble breathing, or anyone who is not able to remove the mask without help. Note: During the COVID-19 pandemic, medical grade facemasks are reserved forhealthcare workers and some first responders. Cover your coughs and sneezes Cover your mouth and nose with a tissue when you cough or sneeze. Throw away used tissues in a lined trash can. Immediately wash your hands with soap and water for at least 20 seconds. If soap and water are not available, clean your hands with an alcohol-based hand sanitizer that contains at least 60% alcohol. Clean your hands often Wash your hands often with soap and water for at least 20 seconds. This is especially important after blowing your nose, coughing, or sneezing; going to the bathroom; and before eating or preparing food. Use hand sanitizer if soap and water are not available. Use an alcohol-based hand sanitizer with at least 60% alcohol, covering all surfaces of your hands and rubbing them together until they feel dry. Soap and water are the best option, especially if hands are visibly dirty. Avoid touching your eyes, nose, and mouth with unwashed hands. Handwashing Tips Avoid sharing   personal household items Do not share dishes, drinking glasses, cups, eating utensils, towels, or bedding with other people in your home. Wash these items thoroughly after using them with soap and water or put in the dishwasher. Clean all "high-touch" surfaces every day Clean and disinfect high-touch surfaces in your "sick room" and bathroom; wear disposable gloves. Let someone else clean and disinfect surfaces in common areas, but you should clean your bedroom and bathroom, if possible. If a caregiver or other person needs to clean and disinfect a sick person's bedroom or bathroom, they should do so on an as-needed basis. The caregiver/other person should wear a mask  and disposable gloves prior to cleaning. They should wait as long as possible after the person who is sick has used the bathroom before coming in to clean and use the bathroom. High-touch surfaces include phones, remote controls, counters, tabletops, doorknobs, bathroom fixtures, toilets, keyboards, tablets, and bedside tables. Clean and disinfect areas that may have blood, stool, or body fluids on them. Use household cleaners and disinfectants. Clean the area or item with soap and water or another detergent if it is dirty. Then, use a household disinfectant. Be sure to follow the instructions on the label to ensure safe and effective use of the product. Many products recommend keeping the surface wet for several minutes to ensure germs are killed. Many also recommend precautions such as wearing gloves and making sure you have good ventilation during use of the product. Use a product from [employer] List N: Disinfectants for Coronavirus (PPJKD-32). Complete Disinfection Guidance When you can be around others after being sick with COVID-19 Deciding when you can be around others is different for different situations. Find out when you can safely end home isolation. For any additional questions about your care,contact your healthcare provider or state or local health department. 08/01/2020 Content source: [facility] ([facility]), Division of Viral Diseases This information is not intended to replace advice given to you by your health care provider. Make sure you discuss any questions you have with your healthcare provider. Document Revised: 09/28/2020 Document Reviewed: 09/28/2020 Elsevier Patient Education  [place].   Can take to lessen severity: Vit C 585m twice daily Quercertin 2671-245YKtwice daily Zinc 75-1067mdaily Melatonin 3-6 mg at bedtime Vit D3 1000-2000 IU daily Aspirin 81 mg daily with food Optional: Famotidine 2064maily Also can  add tylenol/ibuprofen as needed for fevers and body aches May add Mucinex or Mucinex DM as needed for cough/congestion  10 Things You Can Do to Manage Your COVID-19 Symptoms at Home If you have possible or confirmed COVID-19 Stay home except to get medical care. Monitor your symptoms carefully. If your symptoms get worse, call your healthcare provider immediately. Get rest and stay hydrated. If you have a medical appointment, call the healthcare provider ahead of time and tell them that you have or may have COVID-19. For medical emergencies, call 911 and notify the dispatch personnel that you have or may have COVID-19. Cover your cough and sneezes with a tissue or use the inside of your elbow. Wash your hands often with soap and water for at least 20 seconds or clean your hands with an alcohol-based hand sanitizer that contains at least 60% alcohol. As much as possible, stay in a specific room and away from other people in your home. Also, you should use a separate bathroom, if available. If you need to be around other people in or outside of the home,  wear a mask. Avoid sharing personal items with other people in your household, like dishes, towels, and bedding. Clean all surfaces that are touched often, like counters, tabletops, and doorknobs. Use household cleaning sprays or wipes according to the label instructions. [email or web address] 03/09/2020 This information is not intended to replace advice given to you by your health care provider. Make sure you discuss any questions you have with your healthcare provider. Document Revised: 09/28/2020 Document Reviewed: 09/28/2020 Elsevier Patient Education  [place].

## 2021-03-21 NOTE — Telephone Encounter (Signed)
Patient states that she is able to eat and keep food down. Just don't really have appetite. Patient was advise per protocol on appetite as follows:    If appetite becomes worse: encourage patient to drink fluids as tolerated, work their way up to bland solid food such as crackers, pretzels, soup, bread or applesauce and boiled starches.   If patient is unable to tolerate any foods or liquids, notify PCP.   IF PATIENT DEVELOPS SEVERE VOMITING (MORE THAN 6 TIMES A DAY AND OR >8 HOURS) AND/OR SEVERE ABDOMINAL PAIN ADVISE PATIENT TO CALL 911 AND SEEK TREATMENT IN ED  Patient will continue to monitor symptoms.

## 2021-03-21 NOTE — Progress Notes (Signed)
Virtual Visit Consent   Grace [name], you are scheduled for a virtual visit with a [facility] provider today.     Just as with appointments in the office, your consent must be obtained to participate.  Your consent will be active for this visit and any virtual visit you may have with one of our providers in the next 365 days.     If you have a MyChart account, a copy of this consent can be sent to you electronically.  All virtual visits are billed to your insurance company just like a traditional visit in the office.    As this is a virtual visit, video technology does not allow for your provider to perform a traditional examination.  This may limit your provider's ability to fully assess your condition.  If your provider identifies any concerns that need to be evaluated in person or the need to arrange testing (such as labs, EKG, etc.), we will make arrangements to do so.     Although advances in technology are sophisticated, we cannot ensure that it will always work on either your end or our end.  If the connection with a video visit is poor, the visit may have to be switched to a telephone visit.  With either a video or telephone visit, we are not always able to ensure that we have a secure connection.     I need to obtain your verbal consent now.   Are you willing to proceed with your visit today?    Grace [name] has provided verbal consent on 03/21/2021 for a virtual visit (video or telephone).   Mar Daring, PA-C   Date: 03/21/2021 10:44 AM   Virtual Visit via Video Note   I, Mar Daring, connected with  Grace [name]  ([id number], Dec 15, 2000) on 03/21/21 at 10:15 AM EDT by a video-enabled telemedicine application and verified that I am speaking with the correct person using two identifiers.  Location: Patient: Virtual Visit Location Patient: Home Provider: Virtual Visit Location Provider: Home Office   I discussed the limitations of evaluation and management by  telemedicine and the availability of in person appointments. The patient expressed understanding and agreed to proceed.    History of Present Illness: Grace [name] is a 20 y.o. who identifies as a female who was assigned female at birth, and is being seen today for Covid 80.  HPI: URI  This is a new problem. The current episode started in the past 7 days (Tuesday, symptoms started tuesday mildly, last night worsened). The problem has been gradually worsening. Associated symptoms include congestion, coughing (improving), headaches, rhinorrhea, sinus pain and a sore throat. Pertinent negatives include no ear pain or plugged ear sensation. Associated symptoms comments: Myalgias. She has tried acetaminophen, decongestant, sleep and increased fluids (severe sinus congestion) for the symptoms. The treatment provided mild relief.    She is vaccinated.  Problems:  Patient Active Problem List   Diagnosis Date Noted   Elevated blood pressure affecting pregnancy in third trimester, antepartum 07/25/2020   Labor and delivery indication for care or intervention 07/25/2020   Supervision of high risk pregnancy in third trimester 01/12/2020   Mood disorder ([place]) 11/15/2018   PTSD (post-traumatic stress disorder) 11/15/2018   Class 1 obesity due to excess calories without serious comorbidity with body mass index (BMI) of 30.0 to 30.9 in adult 11/15/2018   Neutropenia ([place]) 08/06/2018   Major depression, recurrent, chronic ([place]) 08/06/2018   GAD (generalized anxiety  disorder) 08/06/2018   Leukopenia 11/11/2016   Allergy to nuts 09/15/2016   Panic attack 01/22/2016   Chronic allergic rhinitis 12/20/2015   Asthma, moderate persistent, poorly-controlled 12/20/2015   History of depression 12/20/2015   Primary dysmenorrhea 12/20/2015    Allergies: No Known Allergies Medications:  Current Outpatient Medications:    benzonatate (TESSALON) 100 MG capsule, Take 1 capsule (100 mg total) by mouth 3 (three)  times daily as needed., Disp: 30 capsule, Rfl: 0   fluticasone (FLONASE) 50 MCG/ACT nasal spray, Place 2 sprays into both nostrils daily., Disp: 16 g, Rfl: 6   acetaminophen (TYLENOL) 325 MG tablet, Take 2 tablets (650 mg total) by mouth every 4 (four) hours as needed (for pain scale < 4)., Disp: , Rfl:    albuterol (VENTOLIN HFA) 108 (90 Base) MCG/ACT inhaler, Inhale 2 puffs into the lungs every 6 (six) hours as needed for wheezing or shortness of breath., Disp: 8 g, Rfl: 2   amoxicillin (AMOXIL) 875 MG tablet, Take 1 tablet (875 mg total) by mouth 2 (two) times daily. 1 po BID, Disp: 14 tablet, Rfl: 0   ARIPiprazole (ABILIFY) 5 MG tablet, Take 5 mg by mouth daily. (Patient not taking: No sig reported), Disp: , Rfl:    Blood Pressure KIT, 1 kit by Does not apply route daily. Notify provider for blood pressures less than 90/50 or 160/110 or greater, Disp: 1 kit, Rfl: 0   cetirizine (ZYRTEC) 10 MG tablet, Take 1 tablet (10 mg total) by mouth daily., Disp: 90 tablet, Rfl: 1   medroxyPROGESTERone (DEPO-PROVERA) 150 MG/ML injection, Inject into the muscle., Disp: , Rfl:    montelukast (SINGULAIR) 10 MG tablet, Take 1 tablet (10 mg total) by mouth at bedtime., Disp: 90 tablet, Rfl: 1   NIFEdipine (ADALAT CC) 60 MG 24 hr tablet, Take 1 tablet (60 mg total) by mouth daily. (Patient not taking: No sig reported), Disp: 30 tablet, Rfl: 1   NIFEdipine (PROCARDIA XL/NIFEDICAL XL) 60 MG 24 hr tablet, Take 60 mg by mouth daily. (Patient not taking: Reported on 02/22/2021), Disp: , Rfl:    Prenatal Vit-Fe Fumarate-FA (PRENATAL MULTIVITAMIN) TABS tablet, Take 1 tablet by mouth daily at 12 noon., Disp: 100 tablet, Rfl: 1   sertraline (ZOLOFT) 100 MG tablet, Take 100 mg by mouth daily. (Patient not taking: Reported on 02/22/2021), Disp: , Rfl:    Spacer/Aero-Holding Chambers ([place]) DEVI, See admin instructions. use with inhaler (Patient not taking: No sig reported), Disp: , Rfl:  0  Observations/Objective: Patient is well-developed, well-nourished in no acute distress.  Resting comfortably at home.  Head is normocephalic, atraumatic.  No labored breathing.  Speech is clear and coherent with logical content.  Patient is alert and oriented at baseline.    Assessment and Plan: 1. COVID-19 - MyChart COVID-19 home monitoring program; Future - benzonatate (TESSALON) 100 MG capsule; Take 1 capsule (100 mg total) by mouth 3 (three) times daily as needed.  Dispense: 30 capsule; Refill: 0 - fluticasone (FLONASE) 50 MCG/ACT nasal spray; Place 2 sprays into both nostrils daily.  Dispense: 16 g; Refill: 6  - Continue OTC symptomatic management of choice - Will send OTC vitamins and supplement information through AVS - Patient enrolled in MyChart symptom monitoring - Push fluids - Rest as needed - Discussed return precautions and when to seek in-person evaluation, sent via AVS as well  Follow Up Instructions: I discussed the assessment and treatment plan with the patient. The patient was provided an opportunity to  ask questions and all were answered. The patient agreed with the plan and demonstrated an understanding of the instructions.  A copy of instructions were sent to the patient via MyChart.  The patient was advised to call back or seek an in-person evaluation if the symptoms worsen or if the condition fails to improve as anticipated.  Time:  I spent 13 minutes with the patient via telehealth technology discussing the above problems/concerns.    Mar Daring, PA-C

## 2021-03-22 ENCOUNTER — Encounter (INDEPENDENT_AMBULATORY_CARE_PROVIDER_SITE_OTHER): Payer: Self-pay

## 2021-03-27 ENCOUNTER — Other Ambulatory Visit: Payer: Self-pay | Admitting: Obstetrics and Gynecology

## 2021-03-27 NOTE — Patient Instructions (Signed)
Hi Ms. Richwine, I am sorry that I missed you today, I hope you are doing okay - as a part of your Medicaid benefit, you are eligible for care management and care coordination services at no cost or copay. I was unable to reach you by phone today but would be happy to help you with your health related needs. Please feel free to call me at [phone].  A member of the Managed Medicaid care management team will reach out to you again over the next 7-14 days.   Kathi Der RN, BSN [place]  [facility], production - Managed Medicaid High Risk [phone].

## 2021-03-27 NOTE — Patient Outreach (Signed)
Care Coordination  03/27/2021  NORLENE LANES 12/27/2000 [id number]   Medicaid Managed Care   Unsuccessful Outreach Note  03/27/2021 Name: Grace [name] MRN: [id number] DOB: [birth year]  Referred by: Alba Cory, MD Reason for referral : High Risk Managed Medicaid (Unsuccessful telephone outreach)   An unsuccessful telephone outreach was attempted today. The patient was referred to the case management team for assistance with care management and care coordination.   Follow Up Plan: A member of the Managed Medicaid care management team will reach out to the patient again over the next 7-14 days.   Kathi Der RN, BSN [place]  [facility], production - Managed Medicaid High Risk [phone].

## 2021-04-11 ENCOUNTER — Other Ambulatory Visit: Payer: Self-pay | Admitting: Family Medicine

## 2021-04-11 DIAGNOSIS — J4540 Moderate persistent asthma, uncomplicated: Secondary | ICD-10-CM

## 2021-04-11 NOTE — Telephone Encounter (Signed)
Requested medication (s) are due for refill today:no  Requested medication (s) are on the active medication list: yes  Last refill:  01/21/2021  Future visit scheduled: no  Notes to clinic:  No Protocol attached, review manually    Requested Prescriptions  Pending Prescriptions Disp Refills   montelukast (SINGULAIR) 10 MG tablet [Pharmacy Med Name: montelukast 10 mg tablet] 90 tablet 1    Sig: TAKE ONE TABLET BY MOUTH EVERYDAY AT BEDTIME     There is no refill protocol information for this order

## 2021-04-11 NOTE — Telephone Encounter (Signed)
No longer our pt °

## 2021-04-16 ENCOUNTER — Other Ambulatory Visit: Payer: Self-pay | Admitting: Obstetrics and Gynecology

## 2021-04-16 NOTE — Patient Instructions (Signed)
Hi Ms. Botts, I am sorry I missed you today - as a part of your Medicaid benefit, you are eligible for care management and care coordination services at no cost or copay. I was unable to reach you by phone today but would be happy to help you with your health related needs. Please feel free to call me at [phone].  A member of the Managed Medicaid care management team will reach out to you again over the next 7-14 days.   Kathi Der RN, BSN [place]  [facility] [employer], production - Managed Medicaid High Risk [phone].

## 2021-04-16 NOTE — Patient Outreach (Signed)
Care Coordination  04/16/2021  Grace [name] [birth year] [id number]   Medicaid Managed Care   Unsuccessful Outreach Note  04/16/2021 Name: Grace [name] MRN: [id number] DOB: [birth year]  Referred by: No primary care provider on file. Reason for referral : High Risk Managed Medicaid (Unsuccessful telephone outreach)   A second unsuccessful telephone outreach was attempted today. The patient was referred to the case management team for assistance with care management and care coordination.   Follow Up Plan: A member of the Managed Medicaid care management team will reach out to the patient again over the next 7-14 days.   Kathi Der RN, BSN [place]  [employer] Engineer, production - Managed Medicaid High Risk [phone]

## 2021-04-23 ENCOUNTER — Ambulatory Visit: Payer: Medicaid Other | Admitting: Internal Medicine

## 2021-04-27 ENCOUNTER — Telehealth: Payer: Medicaid Other | Admitting: Physician Assistant

## 2021-04-27 DIAGNOSIS — R059 Cough, unspecified: Secondary | ICD-10-CM

## 2021-04-27 DIAGNOSIS — J4521 Mild intermittent asthma with (acute) exacerbation: Secondary | ICD-10-CM

## 2021-04-27 MED ORDER — PREDNISONE 20 MG PO TABS: 40.0000 mg | ORAL_TABLET | Freq: Every day | ORAL | 0 refills | Status: DC

## 2021-04-27 MED ORDER — BENZONATATE 100 MG PO CAPS: 100.0000 mg | ORAL_CAPSULE | Freq: Two times a day (BID) | ORAL | 0 refills | Status: DC | PRN

## 2021-04-27 MED ORDER — ALBUTEROL SULFATE HFA 108 (90 BASE) MCG/ACT IN AERS: 2.0000 | INHALATION_SPRAY | Freq: Four times a day (QID) | RESPIRATORY_TRACT | 0 refills | Status: DC | PRN

## 2021-04-27 NOTE — Progress Notes (Signed)
Virtual Visit via Video Note  I connected with Grace [name] on 04/27/21 at  6:15 PM EDT by a video enabled telemedicine application and verified that I am speaking with the correct person using two identifiers.  Location: Patient: Patient's home Provider: Provider's office  Person participating in the virtual visit: Patient and provider    I discussed the limitations of evaluation and management by telemedicine and the availability of in person appointments. The patient expressed understanding and agreed to proceed.  I discussed the assessment and treatment plan with the patient. The patient was provided an opportunity to ask questions and all were answered. The patient agreed with the plan and demonstrated an understanding of the instructions.   The patient was advised to call back or seek an in-person evaluation if the symptoms worsen or if the condition fails to improve as anticipated.  I provided 15 minutes of non-face-to-face time during this encounter.   Waldon Merl, PA-C   Subjective:    Patient ID: Grace [name], female    DOB: [birth year], 20 y.o.   MRN: [id number]  No chief complaint on file.   20 yo F in NAD A and O with PMH of Asthma, not on meds, connects via video with complaints of cough, nasal congestion and wheezing x 2 days. Cough is dry. Wheezing is mild. Has used Mucinex and Theraflu without relief of sxs. States she is out of her meds and next scheduled appt with PCP in approx 1 month. Denies any known exposure to someone with Covid 19 or other illness.   Other Associated symptoms include congestion and coughing. Pertinent negatives include no abdominal pain, chest pain, chills, diaphoresis, fatigue, fever, headaches, myalgias, nausea, rash, sore throat, vomiting or weakness.  Cough Associated symptoms include wheezing. Pertinent negatives include no chest pain, chills, ear pain, fever, headaches, myalgias, postnasal drip, rash, rhinorrhea, sore throat or  shortness of breath. Nothing aggravates the symptoms.  Patient is in today for cough and wheezing, congestion x 2 days  Past Medical History:  Diagnosis Date   Acne    Acute nonintractable headache 06/12/2020   Allergy    ALLERGIC RHINITIS   Anxiety    Asthma    Deliberate self-cutting    Depression    Fibrocystic disease of breast    Head pain    History of depression 12/20/2015   History of self-harm    Knee pain, right    Primary dysmenorrhea    Severe myopia of both eyes     Past Surgical History:  Procedure Laterality Date   LAPAROSCOPY  05/07/2017   Procedure: LAPAROSCOPY DIAGNOSTIC;  Surgeon: Schermerhorn, Gwen Her, MD;  Location: [facility] ORS;  Service: Gynecology;;    Family History  Problem Relation Age of Onset   Asthma Father    Hypertension Father    ADD / ADHD Brother     Social History   Socioeconomic History   Marital status: Single    Spouse name: Not on file   Number of children: 1   Years of education: 14   Highest education level: Some college, no degree  Occupational History   Occupation: unemployed  Tobacco Use   Smoking status: Former    Packs/day: 0.25    Years: 2.00    Pack years: 0.50    Types: Cigarettes    Start date: 03/01/2017    Quit date: 07/11/2020    Years since quitting: 0.7   Smokeless tobacco: Never   Tobacco comments:  patient states she vapes once a day  Vaping Use   Vaping Use: Some days   Start date: 03/06/2017  Substance and Sexual Activity   Alcohol use: No    Alcohol/week: 0.0 standard drinks   Drug use: Not Currently    Frequency: 7.0 times per week    Types: Marijuana    Comment: last smoked when she was 4 months pregnant    Sexual activity: Yes    Partners: Male    Birth control/protection: None    Comment: heterosexual   Other Topics Concern   Not on file  Social History Narrative   Relationship with parents is a little better   Social Determinants of Radio broadcast assistant Strain: Low Risk     Difficulty of Paying Living Expenses: Not very hard  Food Insecurity: Landscape architect Present   Worried About Charity fundraiser in the Last Year: Sometimes true   Arboriculturist in the Last Year: Sometimes true  Transportation Needs: Public librarian (Medical): Yes   Lack of Transportation (Non-Medical): Yes  Physical Activity: Inactive   Days of Exercise per Week: 0 days   Minutes of Exercise per Session: 0 min  Stress: Stress Concern Present   Feeling of Stress : Rather much  Social Connections: Socially Isolated   Frequency of Communication with Friends and Family: More than three times a week   Frequency of Social Gatherings with Friends and Family: More than three times a week   Attends Religious Services: Never   Marine scientist or Organizations: No   Attends Music therapist: Never   Marital Status: Never married  Human resources officer Violence: Not At Risk   Fear of Current or Ex-Partner: No   Emotionally Abused: No   Physically Abused: No   Sexually Abused: No    Outpatient Medications Prior to Visit  Medication Sig Dispense Refill   acetaminophen (TYLENOL) 325 MG tablet Take 2 tablets (650 mg total) by mouth every 4 (four) hours as needed (for pain scale < 4).     amoxicillin (AMOXIL) 875 MG tablet Take 1 tablet (875 mg total) by mouth 2 (two) times daily. 1 po BID 14 tablet 0   ARIPiprazole (ABILIFY) 5 MG tablet Take 5 mg by mouth daily. (Patient not taking: No sig reported)     Blood Pressure KIT 1 kit by Does not apply route daily. Notify provider for blood pressures less than 90/50 or 160/110 or greater 1 kit 0   cetirizine (ZYRTEC) 10 MG tablet Take 1 tablet (10 mg total) by mouth daily. 90 tablet 1   fluticasone (FLONASE) 50 MCG/ACT nasal spray Place 2 sprays into both nostrils daily. 16 g 6   medroxyPROGESTERone (DEPO-PROVERA) 150 MG/ML injection Inject into the muscle.     montelukast (SINGULAIR) 10 MG tablet  Take 1 tablet (10 mg total) by mouth at bedtime. 90 tablet 1   NIFEdipine (ADALAT CC) 60 MG 24 hr tablet Take 1 tablet (60 mg total) by mouth daily. (Patient not taking: No sig reported) 30 tablet 1   NIFEdipine (PROCARDIA XL/NIFEDICAL XL) 60 MG 24 hr tablet Take 60 mg by mouth daily. (Patient not taking: Reported on 02/22/2021)     Prenatal Vit-Fe Fumarate-FA (PRENATAL MULTIVITAMIN) TABS tablet Take 1 tablet by mouth daily at 12 noon. 100 tablet 1   sertraline (ZOLOFT) 100 MG tablet Take 100 mg by mouth daily. (Patient not taking: Reported on 02/22/2021)  Spacer/Aero-Holding Chambers (OPTICHAMBER [place]-LG MASK) DEVI See admin instructions. use with inhaler (Patient not taking: No sig reported)  0   albuterol (VENTOLIN HFA) 108 (90 Base) MCG/ACT inhaler Inhale 2 puffs into the lungs every 6 (six) hours as needed for wheezing or shortness of breath. 8 g 2   benzonatate (TESSALON) 100 MG capsule Take 1 capsule (100 mg total) by mouth 3 (three) times daily as needed. 30 capsule 0   No facility-administered medications prior to visit.    No Known Allergies  Review of Systems  Constitutional:  Negative for activity change, appetite change, chills, diaphoresis, fatigue and fever.  HENT:  Positive for congestion. Negative for ear discharge, ear pain, postnasal drip, rhinorrhea, sinus pressure, sinus pain, sneezing, sore throat, tinnitus, trouble swallowing and voice change.   Respiratory:  Positive for cough and wheezing. Negative for apnea, choking, chest tightness, shortness of breath and stridor.   Cardiovascular:  Negative for chest pain, palpitations and leg swelling.  Gastrointestinal:  Negative for abdominal pain, diarrhea, nausea and vomiting.  Musculoskeletal:  Negative for myalgias.  Skin:  Negative for rash.  Neurological:  Negative for dizziness, weakness, light-headedness and headaches.  Hematological:  Negative for adenopathy.  Psychiatric/Behavioral:  Negative for agitation,  behavioral problems and confusion.       Objective:    Physical Exam Constitutional:      General: She is not in acute distress.    Appearance: Normal appearance. She is not ill-appearing, toxic-appearing or diaphoretic.  Pulmonary:     Effort: No respiratory distress.  Neurological:     Mental Status: She is alert and oriented to person, place, and time.  Psychiatric:        Mood and Affect: Mood normal.        Behavior: Behavior normal.        Thought Content: Thought content normal.        Judgment: Judgment normal.    There were no vitals taken for this visit. Wt Readings from Last 3 Encounters:  07/25/20 205 lb (93 kg)  05/26/20 186 lb 12.8 oz (84.7 kg)  05/07/20 183 lb (83 kg)    Health Maintenance Due  Topic Date Due   Pneumococcal Vaccine 63-61 Years old (2 - PCV) 11/26/2005   COVID-19 Vaccine (3 - Booster for Pfizer series) 05/21/2020   PAP-Cervical Cytology Screening  Never done   PAP SMEAR-Modifier  Never done   INFLUENZA VACCINE  03/25/2021    There are no preventive care reminders to display for this patient.   Lab Results  Component Value Date   TSH 1.41 09/03/2018   Lab Results  Component Value Date   WBC 11.8 (H) 07/27/2020   HGB 10.2 (L) 07/27/2020   HCT 29.8 (L) 07/27/2020   MCV 86.1 07/27/2020   PLT 205 07/27/2020   Lab Results  Component Value Date   NA 137 07/27/2020   K 3.6 07/27/2020   CO2 21 (L) 07/27/2020   GLUCOSE 106 (H) 07/27/2020   BUN <5 (L) 07/27/2020   CREATININE 0.68 07/27/2020   BILITOT 0.7 07/27/2020   ALKPHOS 148 (H) 07/27/2020   AST 21 07/27/2020   ALT 10 07/27/2020   PROT 5.5 (L) 07/27/2020   ALBUMIN 2.6 (L) 07/27/2020   CALCIUM 8.8 (L) 07/27/2020   ANIONGAP 9 07/27/2020   Lab Results  Component Value Date   CHOL 187 (H) 12/06/2018   Lab Results  Component Value Date   HDL 50 12/06/2018   Lab Results  Component  Value Date   LDLCALC 114 (H) 12/06/2018   Lab Results  Component Value Date   TRIG 124  (H) 12/06/2018   Lab Results  Component Value Date   CHOLHDL 3.7 12/06/2018   Lab Results  Component Value Date   HGBA1C 5.2 12/06/2018       Assessment & Plan:   Problem List Items Addressed This Visit   None Visit Diagnoses     Mild intermittent asthma with exacerbation    -  Primary   Relevant Medications   albuterol (VENTOLIN HFA) 108 (90 Base) MCG/ACT inhaler   predniSONE (DELTASONE) 20 MG tablet   Cough            Meds ordered this encounter  Medications   albuterol (VENTOLIN HFA) 108 (90 Base) MCG/ACT inhaler    Sig: Inhale 2 puffs into the lungs every 6 (six) hours as needed for wheezing or shortness of breath.    Dispense:  8 g    Refill:  0    Order Specific Question:   Supervising Provider    Answer:   MILLER, BRIAN [[id number]]   benzonatate (TESSALON) 100 MG capsule    Sig: Take 1 capsule (100 mg total) by mouth 2 (two) times daily as needed for cough.    Dispense:  20 capsule    Refill:  0    Order Specific Question:   Supervising Provider    Answer:   MILLER, BRIAN [[id number]]   predniSONE (DELTASONE) 20 MG tablet    Sig: Take 2 tablets (40 mg total) by mouth daily with breakfast.    Dispense:  10 tablet    Refill:  0    Order Specific Question:   Supervising Provider    Answer:   Noemi Chapel [[id number]]    If symptoms continue or if you develop new symptoms, please have a face to face visit for further evaluation. If any worsening, go to the [place], PA-C

## 2021-05-08 ENCOUNTER — Other Ambulatory Visit: Payer: Self-pay | Admitting: Obstetrics and Gynecology

## 2021-05-08 NOTE — Patient Instructions (Signed)
Visit Information  [name]  - as a part of your Medicaid benefit, you are eligible for care management and care coordination services at no cost or copay. I was unable to reach you by phone today but would be happy to help you with your health related needs. Please feel free to call me at [phone].    Kathi Der RN, BSN [place]  [employer] Engineer, production - Managed Medicaid High Risk [phone].

## 2021-05-08 NOTE — Patient Outreach (Cosign Needed)
Care Coordination  05/08/2021  Grace [name] 01-Sep-2000 [id number]   Medicaid Managed Care   Unsuccessful Outreach Note  05/08/2021 Name: Grace [name] MRN: [id number] DOB: [birth year]  Referred by: No primary care provider on file. Reason for referral : High Risk Managed Medicaid (Unsuccessful telephone outreach)   Third unsuccessful telephone outreach was attempted today. The patient was referred to the case management team for assistance with care management and care coordination. The patient's primary care provider has been notified of our unsuccessful attempts to make or maintain contact with the patient. The care management team is pleased to engage with this patient at any time in the future should he/she be interested in assistance from the care management team.   Follow Up Plan: We have been unable to make contact with the patient for follow up. The care management team is available to follow up with the patient after provider conversation with the patient regarding recommendation for care management engagement and subsequent re-referral to the care management team.   Kathi Der RN, BSN [place]  [facility] Care Management Coordinator - Managed [facility] High Risk [phone].

## 2021-05-15 ENCOUNTER — Ambulatory Visit: Payer: Medicaid Other | Admitting: Internal Medicine

## 2021-08-01 ENCOUNTER — Other Ambulatory Visit
Admission: RE | Admit: 2021-08-01 | Discharge: 2021-08-01 | Disposition: A | Discharge status: Home / Self Care | Payer: Medicaid Other | Attending: Internal Medicine | Admitting: Internal Medicine

## 2021-08-01 ENCOUNTER — Other Ambulatory Visit: Payer: Self-pay

## 2021-08-01 ENCOUNTER — Ambulatory Visit (INDEPENDENT_AMBULATORY_CARE_PROVIDER_SITE_OTHER): Payer: Medicaid Other | Admitting: Internal Medicine

## 2021-08-01 ENCOUNTER — Encounter: Payer: Self-pay | Admitting: Internal Medicine

## 2021-08-01 VITALS — BP 150/80 | HR 75 | Ht 66.0 in | Wt 208.0 lb

## 2021-08-01 DIAGNOSIS — R079 Chest pain, unspecified: Secondary | ICD-10-CM | POA: Diagnosis present

## 2021-08-01 DIAGNOSIS — O139 Gestational [pregnancy-induced] hypertension without significant proteinuria, unspecified trimester: Secondary | ICD-10-CM

## 2021-08-01 LAB — CBC
HCT: 36.5 % (ref 36.0–46.0)
Hemoglobin: 12 g/dL (ref 12.0–15.0)
MCH: 27.6 pg (ref 26.0–34.0)
MCHC: 32.9 g/dL (ref 30.0–36.0)
MCV: 84.1 fL (ref 80.0–100.0)
Platelets: 349 10*3/uL (ref 150–400)
RBC: 4.34 MIL/uL (ref 3.87–5.11)
RDW: 14.6 % (ref 11.5–15.5)
WBC: 5.7 10*3/uL (ref 4.0–10.5)
nRBC: 0 % (ref 0.0–0.2)

## 2021-08-01 LAB — COMPREHENSIVE METABOLIC PANEL
ALT: 47 U/L — ABNORMAL HIGH (ref 0–44)
AST: 23 U/L (ref 15–41)
Albumin: 4.2 g/dL (ref 3.5–5.0)
Alkaline Phosphatase: 80 U/L (ref 38–126)
Anion gap: 6 (ref 5–15)
BUN: 7 mg/dL (ref 6–20)
CO2: 25 mmol/L (ref 22–32)
Calcium: 9.1 mg/dL (ref 8.9–10.3)
Chloride: 106 mmol/L (ref 98–111)
Creatinine, Ser: 0.73 mg/dL (ref 0.44–1.00)
GFR, Estimated: >60 mL/min (ref >60)
Glucose, Bld: 93 mg/dL (ref 70–99)
Potassium: 4.1 mmol/L (ref 3.5–5.1)
Sodium: 137 mmol/L (ref 135–145)
Total Bilirubin: 0.7 mg/dL (ref 0.3–1.2)
Total Protein: 7.7 g/dL (ref 6.5–8.1)

## 2021-08-01 LAB — BRAIN NATRIURETIC PEPTIDE: B Natriuretic Peptide: 11 pg/mL (ref 0.0–100.0)

## 2021-08-01 LAB — TROPONIN I (HIGH SENSITIVITY): Troponin I (High Sensitivity): <2 ng/L (ref <18)

## 2021-08-01 MED ORDER — PANTOPRAZOLE SODIUM 40 MG PO TBEC: 40.0000 mg | DELAYED_RELEASE_TABLET | Freq: Every day | ORAL | 1 refills | Status: DC

## 2021-08-01 NOTE — Patient Instructions (Signed)
Medication Instructions:   Your physician has recommended you make the following change in your medication:   START Pantoprazole (Protonix) 40 mg daily   *If you need a refill on your cardiac medications before your next appointment, please call your pharmacy*   Lab Work:  Today: CBC, CMET, BNP, Troponin   -  Please go to the [facility].  -  You will check in at the front desk to the right as you walk into the atrium.   If you have labs (blood work) drawn today and your tests are completely normal, you will receive your results only by: MyChart Message (if you have MyChart) OR A paper copy in the mail If you have any lab test that is abnormal or we need to change your treatment, we will call you to review the results.   Testing/Procedures:  Your physician has requested that you have an echocardiogram NEXT WEEK. Echocardiography is a painless test that uses sound waves to create images of your heart. It provides your doctor with information about the size and shape of your heart and how well your heart's chambers and valves are working. This procedure takes approximately one hour. There are no restrictions for this procedure.    Follow-Up: At [facility], you and your health needs are our priority.  As part of our continuing mission to provide you with exceptional heart care, we have created designated Provider Care Teams.  These Care Teams include your primary Cardiologist (physician) and Advanced Practice Providers (APPs -  Physician Assistants and Nurse Practitioners) who all work together to provide you with the care you need, when you need it.  We recommend signing up for the patient portal called "MyChart".  Sign up information is provided on this After Visit Summary.  MyChart is used to connect with patients for Virtual Visits (Telemedicine).  Patients are able to view lab/test results, encounter notes, upcoming appointments, etc.  Non-urgent messages can be sent to  your provider as well.   To learn more about what you can do with MyChart, go to [email or web address].    Your next appointment:   1 month(s)  The format for your next appointment:   In Person  Provider:   You may see Dr. Cristal Deer End or one of the following Advanced Practice Providers on your designated Care Team:   Nicolasa Ducking, NP Eula Listen, PA-C Cadence Fransico Michael, [place]

## 2021-08-01 NOTE — Progress Notes (Signed)
New Outpatient Visit Date: 08/01/2021  Referring Provider: Lorn Junes, FNP [address] [place],  [place] [address]  Chief Complaint: Chest pain  HPI:  Grace [name] is a 20 y.o. female who is being seen today for the evaluation of chest pain at the request of Grace [name]. She has a history of asthma, anxiety, depression, and PTSD.  Grace [name] reports that for the last 1.5 months, she has had progressive chest pain.  However, on further questioning, it sounds like her chest pain dates back even a few months before this.  She initially noted sharp pain beginning in both shoulders that would intermittently radiate to her chest.  However, the chest pain is now more centrally located and is present almost all the time.  It seems to worsen some with exertion, though it was significantly worse yesterday after she vomited 7 times.  She sometimes also feels as though she has butterflies in her chest.  She has tried taking Tums without significant relief, though she notes in the past that she has had heartburn with spicy food.  Grace [name] reports chronic exertional dyspnea.  She notes that her feet are swollen at times.  She was hypertensive during her pregnancy last year but reports that her blood pressure gradually normalized.  She was started on OCP last month by her gynecologist due to pelvic pain.  She denies a history of heart disease and prior heart testing.  She is concerned because her father developed heart disease in his 92s (potentially heart failure) and has had several MIs beginning around age 12.  --------------------------------------------------------------------------------------------------  Cardiovascular History & Procedures: Cardiovascular Problems: Chest pain  Risk Factors: Obesity and family history  Cath/PCI: None  CV Surgery: None  EP Procedures and Devices: None  Non-Invasive Evaluation(s): None  Recent CV Pertinent Labs: Lab Results  Component Value Date    CHOL 187 (H) 12/06/2018   HDL 50 12/06/2018   LDLCALC 114 (H) 12/06/2018   TRIG 124 (H) 12/06/2018   CHOLHDL 3.7 12/06/2018   K 3.6 07/27/2020   BUN <5 (L) 07/27/2020   CREATININE 0.68 07/27/2020   CREATININE 0.86 12/20/2018    --------------------------------------------------------------------------------------------------  Past Medical History:  Diagnosis Date   Acne    Acute nonintractable headache 06/12/2020   Allergy    ALLERGIC RHINITIS   Anxiety    Asthma    Deliberate self-cutting    Depression    Fibrocystic disease of breast    Head pain    History of depression 12/20/2015   History of self-harm    Knee pain, right    Primary dysmenorrhea    Severe myopia of both eyes     Past Surgical History:  Procedure Laterality Date   LAPAROSCOPY  05/07/2017   Procedure: LAPAROSCOPY DIAGNOSTIC;  Surgeon: Suzy Bouchard, MD;  Location: [facility] ORS;  Service: Gynecology;;    Current Meds  Medication Sig   acetaminophen (TYLENOL) 325 MG tablet Take 2 tablets (650 mg total) by mouth every 4 (four) hours as needed (for pain scale < 4).   albuterol (VENTOLIN HFA) 108 (90 Base) MCG/ACT inhaler Inhale 2 puffs into the lungs every 6 (six) hours as needed for wheezing or shortness of breath.   Blood Pressure KIT 1 kit by Does not apply route daily. Notify provider for blood pressures less than 90/50 or 160/110 or greater   cetirizine (ZYRTEC) 10 MG tablet Take 1 tablet (10 mg total) by mouth daily.   fluticasone (FLONASE) 50 MCG/ACT  nasal spray Place 2 sprays into both nostrils daily.   montelukast (SINGULAIR) 10 MG tablet TAKE ONE TABLET BY MOUTH EVERYDAY AT BEDTIME    Allergies: Patient has no known allergies.  Social History   Tobacco Use   Smoking status: Former    Packs/[name]: 0.25    Years: 2.00    Pack years: 0.50    Types: Cigarettes    Start date: 03/01/2017    Quit date: 07/11/2020    Years since quitting: 1.0   Smokeless tobacco: Never   Tobacco comments:     patient states she vapes once a [name]  Vaping Use   Vaping Use: Every [name]   Start date: 03/06/2017  Substance Use Topics   Alcohol use: Yes    Comment: occassionally   Drug use: Yes    Types: Marijuana    Comment: occassionally    Family History  Problem Relation Age of Onset   Hyperlipidemia Mother    Endometriosis Mother    Anxiety disorder Mother    Depression Mother    Post-traumatic stress disorder Mother    Post-traumatic stress disorder Father    Depression Father    Anxiety disorder Father    Hyperlipidemia Father    Heart disease Father    Heart attack Father    Heart failure Father    Asthma Father    Hypertension Father    ADD / ADHD Brother     Review of Systems: A 12-system review of systems was performed and was negative except as noted in the HPI.  --------------------------------------------------------------------------------------------------  Physical Exam: BP (!) 150/80 (BP Location: Right Arm, Patient Position: Sitting, Cuff Size: Large)   Pulse 75   Ht $R'5\' 6"'Mt$  (1.676 m)   Wt 208 lb (94.3 kg)   SpO2 98%   BMI 33.57 kg/m  Repeat BP: 138/84  General: NAD. HEENT: No conjunctival pallor or scleral icterus. Facemask in place. Neck: Supple without lymphadenopathy, thyromegaly, JVD, or HJR. No carotid bruit. Lungs: Normal work of breathing. Clear to auscultation bilaterally without wheezes or crackles. Heart: Regular rate and rhythm without murmurs, rubs, or gallops. Non-displaced PMI. Abd: Bowel sounds present. Soft, NT/ND without hepatosplenomegaly Ext: No lower extremity edema. Radial, PT, and DP pulses are 2+ bilaterally Skin: Warm and dry without rash. Neuro: CNIII-XII intact. Strength and fine-touch sensation intact in upper and lower extremities bilaterally. Psych: Normal mood and affect.  EKG: Normal sinus rhythm without abnormality.  Lab Results  Component Value Date   WBC 11.8 (H) 07/27/2020   HGB 10.2 (L) 07/27/2020   HCT 29.8 (L)  07/27/2020   MCV 86.1 07/27/2020   PLT 205 07/27/2020    Lab Results  Component Value Date   NA 137 07/27/2020   K 3.6 07/27/2020   CL 107 07/27/2020   CO2 21 (L) 07/27/2020   BUN <5 (L) 07/27/2020   CREATININE 0.68 07/27/2020   GLUCOSE 106 (H) 07/27/2020   ALT 10 07/27/2020    Lab Results  Component Value Date   CHOL 187 (H) 12/06/2018   HDL 50 12/06/2018   LDLCALC 114 (H) 12/06/2018   TRIG 124 (H) 12/06/2018   CHOLHDL 3.7 12/06/2018     --------------------------------------------------------------------------------------------------  ASSESSMENT AND PLAN: Chest pain: Pain has both typical and noncardiac features.  Her young age makes it extremely unlikely that she has ASCVD.  Time course normal EKG today are also not consistent with SCAD.  I am most concerned for GI and or musculoskeletal etiology.  I have recommended  continued use of acetaminophen as needed as well as a trial of pantoprazole 40 mg daily.  We will obtain an echocardiogram to exclude structural abnormalities and to potentially visualize the coronary ostia to exclude anomalous coronary artery.  I will check a CBC, CMP, BNP, and troponin today.  If troponin is elevated or symptoms worsen, Grace [name] will need to go to the emergency department for further evaluation.  Gestational hypertension: Grace [name] reports hypertension during her pregnancy last year, which has subsequently improved.  Initial blood pressure measurements today were mildly elevated, though improved on recheck.  We will defer further intervention at this time.  Polysubstance abuse: Grace [name] reports vaping and smoking marijuana regularly.  Cessation encouraged.  Follow-up: Return to clinic in 1 month.  Nelva Bush, MD 08/01/2021 1:55 PM

## 2021-08-09 ENCOUNTER — Other Ambulatory Visit: Payer: Self-pay

## 2021-08-09 ENCOUNTER — Telehealth: Payer: Self-pay | Admitting: Internal Medicine

## 2021-08-09 ENCOUNTER — Ambulatory Visit (INDEPENDENT_AMBULATORY_CARE_PROVIDER_SITE_OTHER): Payer: Medicaid Other

## 2021-08-09 DIAGNOSIS — R079 Chest pain, unspecified: Secondary | ICD-10-CM | POA: Diagnosis not present

## 2021-08-09 LAB — ECHOCARDIOGRAM COMPLETE
AR max vel: 3.23 cm2
AV Area VTI: 3.37 cm2
AV Area mean vel: 3.28 cm2
AV Mean grad: 3 mmHg
AV Peak grad: 6.5 mmHg
Ao pk vel: 1.27 m/s
Area-P 1/2: 3.58 cm2
Calc EF: 57.3 %
S' Lateral: 2.8 cm
Single Plane A2C EF: 56.7 %
Single Plane A4C EF: 58.4 %

## 2021-08-09 NOTE — Telephone Encounter (Signed)
Recent reassuringly normal troponin in the setting of c/p on 12/8.  That said, if she is having recurrent c/p or c/p of a different character, she likely needs to be eval in the ED.

## 2021-08-09 NOTE — Telephone Encounter (Signed)
I spoke with the patient and made her aware of Ward Givens, NP recommendations.  I misunderstood the patient earlier as I thought she said she had already come in for the echocardiogram today, but this is not scheduled until 4 pm today.   The patient voices understanding or NP recommendations, but did not confirm that she would go to the ER. As I was attempting to ask another question, the patient did go ahead and hang up the phone.  Will follow her chart to see if she reports to the ER.

## 2021-08-09 NOTE — Telephone Encounter (Signed)
The patient did not go to the ER as recommended. She did come to our office this afternoon for her echo appointment. Per Jillyn Hidden, echo tech, the patient advised she was attempting to go to Urgent Care prior to her echo appointment today, but the wait was too long.  Per Jillyn Hidden, no acute findings noted on the patient's echo.  Will await the final report on the echo.  Will forward to Dr. Cheri Guppy, RN as an Lorain Childes.

## 2021-08-09 NOTE — Telephone Encounter (Signed)
Call transferred directly to this RN. The patient saw Dr. Okey Dupre on 08/01/21 with a 1.5 + months history of chest pain. The patient reports today that she has had chest pain every day since her visit with Dr. Okey Dupre.  Her concern today is that the pain "feels different," in that it is more constant and feels like pressure.  The pain feels worse when she talks/ walks, but otherwise is not positional. This can occur at rest.  I inquired if symptoms are worse if she presses on her chest and she advised her chest is already tender to touch.  She is having fatigue/ nausea-- she did take nausea medication that helped for a short time.  Her hands/ feet feel "crampy."  She advised she took a reflux medication x 3-4 days, but stopped as she felt her symptoms were getting worse. She saw her PCP on Monday 12/12 and BP was 155/100 at that visit.  I inquired if they changed any medications based on this. Per the patient, they rechecked her BP and her pressure came down, but she did not provide a #. She did state that her birth control was changed at this visit.  She started the new birth control on Tuesday.   Per the patient, her change in symptoms started 2 days ago.   The patient had an echocardiogram this morning, but results are still pending at this time and the patient has been notified of this.   The patient is aware I will forward to one of our in clinic providers to review and we will call her back with further recommendations.   The patient voices understanding and is agreeable.

## 2021-08-09 NOTE — Telephone Encounter (Signed)
Pt c/o of Chest Pain: STAT if CP now or developed within 24 hours  1. Are you having CP right now? yes  2. Are you experiencing any other symptoms (ex. SOB, nausea, vomiting, sweating)? SOB - nausea has subsided   3. How long have you been experiencing CP? Few weeks or more   4. Is your CP continuous or coming and going? Coming and going   5. Have you taken Nitroglycerin? no ? Patient has an ECHO today at 4p

## 2021-08-12 NOTE — Telephone Encounter (Signed)
Echo shows normal ventricular function with mild mitral and mild-moderate tricuspid regurgitation.  While these findings are not normal, the do not explain her persistent chest pain.  I recommend that we try to work her in this week to see an APP/DOD if she continues to have chest pain that has not improved with acetaminophen and pantoprazole.  If she is feeling better, she can follow-up with me as planned next month.  Yvonne Kendall, MD [facility]

## 2021-08-12 NOTE — Telephone Encounter (Signed)
Left voicemail message to call back for review of provider recommendations.  

## 2021-08-12 NOTE — Telephone Encounter (Signed)
Reviewed provider recommendations and she states that the pain is persistent and does not come or go. She has been taking the medication with no relief of her symptoms. Dr. Okey Dupre requested that if her symptoms persist then to get her in to see DOD this week. Scheduled with provider this week and confirmed with her time and date. She verbalized understanding with no further questions at this time.

## 2021-08-12 NOTE — Telephone Encounter (Signed)
Patient returning call.

## 2021-08-14 ENCOUNTER — Encounter: Payer: Self-pay | Admitting: Cardiovascular Disease

## 2021-08-14 ENCOUNTER — Other Ambulatory Visit: Payer: Self-pay

## 2021-08-14 ENCOUNTER — Ambulatory Visit (INDEPENDENT_AMBULATORY_CARE_PROVIDER_SITE_OTHER): Payer: Medicaid Other | Admitting: Cardiovascular Disease

## 2021-08-14 VITALS — BP 120/90 | HR 81 | Ht 66.0 in | Wt 207.0 lb

## 2021-08-14 DIAGNOSIS — R079 Chest pain, unspecified: Secondary | ICD-10-CM

## 2021-08-14 NOTE — Progress Notes (Signed)
Cardiology Office Note   Date:  08/14/2021   ID:  Grace [name], DOB 12-15-2000, MRN [id number]  PCP:  Lorn Junes, FNP  Cardiologist:   Dr. Okey Dupre  Chief Complaint  Patient presents with   Other    1 month f/u c/o on going chest pain/pressure/tightness and sob. Meds reviewed verbally with pt.      History of Present Illness: Grace [name] is a 20 y.o. female who was added to my schedule today for evaluation of chest pain.  She was seen by Dr. Okey Dupre for this on December 8.  She has known history of asthma, anxiety, depression and PTSD.  She reports continuous chest pain which is constant and has been happening for about 2 to 3 months.  The pain does not subside.  She describes 2 different qualities.  There is a sharp discomfort in the left side of the sternal which seems to be reproducible.  In addition, she reports tightness feeling that worsens with exertion.  She had troponin done which was normal.  EKG showed no ischemic changes.  She was referred for an echocardiogram which showed normal LV systolic function with mild mitral regurgitation and mild to moderate tricuspid regurgitation with no evidence of pulmonary hypertension. She reports that she has been trying to avoid getting out of bed every day because of the chest pain, fatigue and shortness of breath.  She vapes regularly and uses marijuana.    Past Medical History:  Diagnosis Date   Acne    Acute nonintractable headache 06/12/2020   Allergy    ALLERGIC RHINITIS   Anxiety    Asthma    Deliberate self-cutting    Depression    Fibrocystic disease of breast    Head pain    History of depression 12/20/2015   History of self-harm    Knee pain, right    Primary dysmenorrhea    Severe myopia of both eyes     Past Surgical History:  Procedure Laterality Date   LAPAROSCOPY  05/07/2017   Procedure: LAPAROSCOPY DIAGNOSTIC;  Surgeon: Suzy Bouchard, MD;  Location: [facility] ORS;  Service: Gynecology;;      Current Outpatient Medications  Medication Sig Dispense Refill   acetaminophen (TYLENOL) 325 MG tablet Take 2 tablets (650 mg total) by mouth every 4 (four) hours as needed (for pain scale < 4).     albuterol (VENTOLIN HFA) 108 (90 Base) MCG/ACT inhaler Inhale 2 puffs into the lungs every 6 (six) hours as needed for wheezing or shortness of breath. 8 g 0   Blood Pressure KIT 1 kit by Does not apply route daily. Notify provider for blood pressures less than 90/50 or 160/110 or greater 1 kit 0   cetirizine (ZYRTEC) 10 MG tablet Take 1 tablet (10 mg total) by mouth daily. 90 tablet 1   fluticasone (FLONASE) 50 MCG/ACT nasal spray Place 2 sprays into both nostrils daily. 16 g 6   levonorgestrel-ethinyl estradiol (SEASONALE) 0.15-0.03 MG tablet Take 1 tablet by mouth daily.     montelukast (SINGULAIR) 10 MG tablet TAKE ONE TABLET BY MOUTH EVERYDAY AT BEDTIME 90 tablet 1   pantoprazole (PROTONIX) 40 MG tablet Take 1 tablet (40 mg total) by mouth daily. 30 tablet 1   No current facility-administered medications for this visit.    Allergies:   Patient has no known allergies.    Social History:  The patient  reports that she quit smoking about 13 months ago. Her smoking use  included cigarettes and e-cigarettes. She started smoking about 4 years ago. She has a 0.50 pack-year smoking history. She has never used smokeless tobacco. She reports current alcohol use. She reports current drug use. Frequency: 7.00 times per week. Drug: Marijuana.   Family History:  The patient's family history includes ADD / ADHD in her brother; Anxiety disorder in her father and mother; Asthma in her father; Depression in her father and mother; Endometriosis in her mother; Heart attack (age of onset: 67) in her father; Heart disease in her father; Heart failure in her father; Hyperlipidemia in her father and mother; Hypertension in her father; Post-traumatic stress disorder in her father and mother.    ROS:  Please see  the history of present illness.   Otherwise, review of systems are positive for none.   All other systems are reviewed and negative.    PHYSICAL EXAM: VS:  BP 120/90 (BP Location: Left Arm, Patient Position: Sitting, Cuff Size: Normal)    Pulse 81    Ht $R'5\' 6"'ZK$  (1.676 m)    Wt 207 lb (93.9 kg)    SpO2 98%    BMI 33.41 kg/m  , BMI Body mass index is 33.41 kg/m. GEN: Well nourished, well developed, in no acute distress  HEENT: normal  Neck: no JVD, carotid bruits, or masses Cardiac: RRR; no murmurs, rubs, or gallops,no edema  Respiratory:  clear to auscultation bilaterally, normal work of breathing GI: soft, nontender, nondistended, + BS MS: no deformity or atrophy  Skin: warm and dry, no rash Neuro:  Strength and sensation are intact Psych: euthymic mood, full affect   EKG:  EKG is ordered today. The ekg ordered today demonstrates normal sinus rhythm with no significant ST or T wave changes   Recent Labs: 08/01/2021: ALT 47; B Natriuretic Peptide 11.0; BUN 7; Creatinine, Ser 0.73; Hemoglobin 12.0; Platelets 349; Potassium 4.1; Sodium 137    Lipid Panel    Component Value Date/Time   CHOL 187 (H) 12/06/2018 1126   TRIG 124 (H) 12/06/2018 1126   HDL 50 12/06/2018 1126   CHOLHDL 3.7 12/06/2018 1126   LDLCALC 114 (H) 12/06/2018 1126      Wt Readings from Last 3 Encounters:  08/14/21 207 lb (93.9 kg)  08/01/21 208 lb (94.3 kg)  07/25/20 205 lb (93 kg)       PAD Screen 08/01/2021  Previous PAD dx? No  Previous surgical procedure? No  Pain with walking? No  Feet/toe relief with dangling? No  Painful, non-healing ulcers? No  Extremities discolored? No      ASSESSMENT AND PLAN:  1.  Chest pain: She has tenderness at the left side of the sternum suggestive of costochondritis.  I asked her to use NSAIDs as needed for this.  In addition, she reports a different kind of discomfort described as tightness feeling that occasionally worsen with exertion.  Due to that, I requested  a treadmill stress test.  Suspicion for atherosclerosis is very low considering her young age but we will be able to evaluate ischemia for other less common causes like SCAD or coronary anomalies.  2.  History of gestational hypertension: Diastolic blood pressure is mildly elevated today, continue to monitor and consider adding antihypertensive medications if needed.     Disposition:   Proceed with treadmill stress test and keep follow-up with Dr. Saunders Revel as planned.  Signed,  [name], MD  08/14/2021 4:03 PM    [facility]

## 2021-08-14 NOTE — Patient Instructions (Signed)
Medication Instructions:  Your physician recommends that you continue on your current medications as directed. Please refer to the Current Medication list given to you today.  *If you need a refill on your cardiac medications before your next appointment, please call your pharmacy*   Lab Work: None ordered  If you have labs (blood work) drawn today and your tests are completely normal, you will receive your results only by: MyChart Message (if you have MyChart) OR A paper copy in the mail If you have any lab test that is abnormal or we need to change your treatment, we will call you to review the results.   Testing/Procedures: Your physician has requested that you have an exercise tolerance test. For further information please visit [email or web address] Please also follow instruction sheet, as given.    Follow-Up: At [facility], you and your health needs are our priority.  As part of our continuing mission to provide you with exceptional heart care, we have created designated Provider Care Teams.  These Care Teams include your primary Cardiologist (physician) and Advanced Practice Providers (APPs -  Physician Assistants and Nurse Practitioners) who all work together to provide you with the care you need, when you need it.  We recommend signing up for the patient portal called "MyChart".  Sign up information is provided on this After Visit Summary.  MyChart is used to connect with patients for Virtual Visits (Telemedicine).  Patients are able to view lab/test results, encounter notes, upcoming appointments, etc.  Non-urgent messages can be sent to your provider as well.   To learn more about what you can do with MyChart, go to [email or web address].    Your next appointment:   As planned with Dr. Okey Dupre  The format for your next appointment:   In Person  Provider:   You may see Yvonne Kendall, MD or one of the following Advanced Practice Providers on your designated Care Team:    Nicolasa Ducking, NP Eula Listen, PA-C Cadence Fransico Michael, PA-C{    Other Instructions  Please follow the pre-test instructions below for your stress test  - you may eat a light breakfast/ lunch prior to your procedure - no caffeine for 24 hours prior to your test (coffee, tea, soft drinks, or chocolate)  - no smoking/ vaping for 4 hours prior to your test - you may take your regular medications the day of your test except for: - bring any inhalers with you to your test - wear comfortable clothing & tennis/ non-skid shoes to walk on the treadmill

## 2021-08-15 NOTE — Telephone Encounter (Signed)
Reviewed the patient's chart- she was seen and evaluated in the office yesterday by Dr. Kirke Corin.   See MD note from 08/14/21. The patient is currently scheduled for a GXT on 08/21/21 7 to follow up with Dr. Okey Dupre on 09/12/21.

## 2021-08-20 ENCOUNTER — Telehealth: Payer: Self-pay

## 2021-08-20 NOTE — Telephone Encounter (Signed)
Was able to reach out to Grace [name] to confirm her  exercise tolerance test schedule for tomorrow 12/28 at 2:00 PM.   Reviewed instructions with patient - you may eat a light breakfast/ lunch prior to your procedure - no caffeine for 24 hours prior to your test (coffee, tea, soft drinks, or chocolate)  - no smoking/ vaping for 4 hours prior to your test - you may take your regular medications the day of your test - wear comfortable clothing & tennis/ non-skid shoes to walk on the treadmill (no house slippers, sandals, or flipflop)   Grace [name] verbalized understanding, questions and concerns address, pt aware to arrive at least 15 mins early to get check-in and register.

## 2021-08-25 NOTE — L&D Delivery Note (Signed)
Obstetrical Delivery Note   Date of Delivery:   04/24/2022 Primary OB:   [facility] Gestational Age/EDD: [redacted]w[redacted]d (Dated by L and 7wkUS) Reason for Admission: IOL secondary to IUGR and GHTN Antepartum complications: intrauterine growth restriction and GHTN, Depression, family stress, MJ use   Delivered By:   Siri Cole, CNM   Delivery Type:   spontaneous vaginal delivery  Delivery Details:   Made minimal progress during the day of 8/30 and overnight.  FB and Cytotec placed on 8/31 around 0920.  Labor progressed linearly from there. She received multiple doses of Fentanyl. She received a labor epidural, AROM for clear at 1400. Felt intense pressure around 1720, found to be an anterior lip. Breathed through contractions and began to spontaneously push at 1745.  SVB of viable female at 48. Infant birthed OA to ROA through nuchal cord, to mother's abd, small but loud cry, HR> 100 but poor tone noted, transition nurse requested cord to be clamped and cut, infant taken to warmer for assessment. Placenta delivered with maternal effort. On inspection of perineum small abrasion noted on left labia, 2 stiches placed with 3-0 Vicryl placed, area hemostatic. Infant placed skin to skin. Both stable.  Anesthesia:    epidural Intrapartum complications: None GBS:    Negative Laceration:    labial Episiotomy:    none Rectal exam:   deferred Placenta:    Delivered and expressed via active management. Intact: yes. To pathology: no.  Delayed Cord Clamping: > 1 minute  Estimated Blood Loss:  100  Baby:    Liveborn female, APGARs 6/9, weight 2480gm  Carie Caddy, CNM  [facility], [facility]  @TODAY @  7:01 PM

## 2021-08-28 ENCOUNTER — Other Ambulatory Visit (HOSPITAL_COMMUNITY)
Admission: RE | Admit: 2021-08-28 | Discharge: 2021-08-28 | Disposition: A | Discharge status: Home / Self Care | Payer: Medicaid Other | Source: Ambulatory Visit | Attending: Obstetrics and Gynecology | Admitting: Obstetrics and Gynecology

## 2021-08-28 ENCOUNTER — Encounter: Payer: Self-pay | Admitting: Obstetrics and Gynecology

## 2021-08-28 ENCOUNTER — Ambulatory Visit (INDEPENDENT_AMBULATORY_CARE_PROVIDER_SITE_OTHER): Payer: Medicaid Other | Admitting: Obstetrics and Gynecology

## 2021-08-28 ENCOUNTER — Other Ambulatory Visit: Payer: Self-pay

## 2021-08-28 VITALS — BP 140/98 | Wt 210.0 lb

## 2021-08-28 DIAGNOSIS — N644 Mastodynia: Secondary | ICD-10-CM

## 2021-08-28 DIAGNOSIS — Z01419 Encounter for gynecological examination (general) (routine) without abnormal findings: Secondary | ICD-10-CM

## 2021-08-28 DIAGNOSIS — Z0000 Encounter for general adult medical examination without abnormal findings: Secondary | ICD-10-CM

## 2021-08-28 DIAGNOSIS — Z3009 Encounter for other general counseling and advice on contraception: Secondary | ICD-10-CM

## 2021-08-28 DIAGNOSIS — Z3202 Encounter for pregnancy test, result negative: Secondary | ICD-10-CM | POA: Diagnosis not present

## 2021-08-28 DIAGNOSIS — Z124 Encounter for screening for malignant neoplasm of cervix: Secondary | ICD-10-CM

## 2021-08-28 DIAGNOSIS — N926 Irregular menstruation, unspecified: Secondary | ICD-10-CM

## 2021-08-28 LAB — POCT URINE PREGNANCY: Preg Test, Ur: NEGATIVE

## 2021-08-28 NOTE — Progress Notes (Signed)
Bunnie Pion, FNP   Chief Complaint  Patient presents with   Breast Problem   Establish Care        HPI:      [name] is a 21 y.o. G2P1011 whose LMP was Patient's last menstrual period was 07/23/2021 (exact date)., presents today for NP eval of breast pain and to establish care, referred by PCP. Due for annual. Pt with hx of fibrocystic breasts. Has had sharp pains LT breast for past 2 months, but improving. No discrete mass but feels lumpy in general. Minimal to no caffeine use. Sx come and go, no aggrav/allev factors. Not necessarily related to menses. Was on OCPs and then changed to POPs 12/22. Pt's period is 10 days late, no recent UPT. No FH breast/ovar cancer that she knows of.  Pt was on estrogen OCPs with late missed pills, but did have monthly menses, sometimes with BTB. 07/23/21 bleeding was BTB on active pills, had pos UPT (with neg serum beta 12/22), then changed to POPs mid pill pack for 1 wk. Hx of monthly menses prior to OCPs. Has mood changes with hormones so concerned about using them. Has appt with [facility] coming up.   Neg STD testing 11/22; no evid of pap done She is sex active, not using condoms. Unsure of BC currently.   Pt vapes daily, occas alcohol and marijuana use.  She does get Ca/Vit D supp.    Patient Active Problem List   Diagnosis Date Noted   Chest pain 08/01/2021   Elevated blood pressure affecting pregnancy in third trimester, antepartum 07/25/2020   Labor and delivery indication for care or intervention 07/25/2020   Gestational hypertension, antepartum 07/01/2020   Supervision of high risk pregnancy in third trimester 01/12/2020   Mood disorder ([place]) 11/15/2018   PTSD (post-traumatic stress disorder) 11/15/2018   Class 1 obesity due to excess calories without serious comorbidity with body mass index (BMI) of 30.0 to 30.9 in adult 11/15/2018   Neutropenia ([place]) 08/06/2018   Major depression, recurrent, chronic ([place])  08/06/2018   GAD (generalized anxiety disorder) 08/06/2018   Leukopenia 11/11/2016   Allergy to nuts 09/15/2016   Panic attack 01/22/2016   Chronic allergic rhinitis 12/20/2015   Asthma, moderate persistent, poorly-controlled 12/20/2015   History of depression 12/20/2015   Primary dysmenorrhea 12/20/2015    Past Surgical History:  Procedure Laterality Date   LAPAROSCOPY  05/07/2017   Procedure: LAPAROSCOPY DIAGNOSTIC;  Surgeon: Schermerhorn, Gwen Her, MD;  Location: [facility] ORS;  Service: Gynecology;;    Family History  Problem Relation Age of Onset   Hyperlipidemia Mother    Endometriosis Mother    Anxiety disorder Mother    Depression Mother    Post-traumatic stress disorder Mother    Post-traumatic stress disorder Father    Depression Father    Anxiety disorder Father    Hyperlipidemia Father    Heart disease Father    Heart attack Father 36   Heart failure Father    Asthma Father    Hypertension Father    ADD / ADHD Brother     Social History   Socioeconomic History   Marital status: Single    Spouse name: Not on file   Number of children: 1   Years of education: 14   Highest education level: Some college, no degree  Occupational History   Occupation: unemployed  Tobacco Use   Smoking status: Former    Packs/day: 0.25    Years:  2.00    Pack years: 0.50    Types: Cigarettes, E-cigarettes    Start date: 03/01/2017    Quit date: 07/11/2020    Years since quitting: 1.1   Smokeless tobacco: Never   Tobacco comments:    patient states she vapes once a day  Vaping Use   Vaping Use: Every day   Start date: 03/06/2017  Substance and Sexual Activity   Alcohol use: Yes    Comment: occassionally   Drug use: Yes    Frequency: 7.0 times per week    Types: Marijuana    Comment: occassionally   Sexual activity: Yes    Partners: Male    Birth control/protection: None    Comment: heterosexual   Other Topics Concern   Not on file  Social History Narrative    Relationship with parents is a little better   Social Determinants of Radio broadcast assistant Strain: Low Risk    Difficulty of Paying Living Expenses: Not very hard  Food Insecurity: Landscape architect Present   Worried About Charity fundraiser in the Last Year: Sometimes true   Arboriculturist in the Last Year: Sometimes true  Transportation Needs: Public librarian (Medical): Yes   Lack of Transportation (Non-Medical): Yes  Physical Activity: Not on file  Stress: Stress Concern Present   Feeling of Stress : Rather much  Social Connections: Not on file  Intimate Partner Violence: Not At Risk   Fear of Current or Ex-Partner: No   Emotionally Abused: No   Physically Abused: No   Sexually Abused: No    Outpatient Medications Prior to Visit  Medication Sig Dispense Refill   acetaminophen (TYLENOL) 325 MG tablet Take 2 tablets (650 mg total) by mouth every 4 (four) hours as needed (for pain scale < 4).     albuterol (VENTOLIN HFA) 108 (90 Base) MCG/ACT inhaler Inhale 2 puffs into the lungs every 6 (six) hours as needed for wheezing or shortness of breath. 8 g 0   Blood Pressure KIT 1 kit by Does not apply route daily. Notify provider for blood pressures less than 90/50 or 160/110 or greater 1 kit 0   cetirizine (ZYRTEC) 10 MG tablet Take 1 tablet (10 mg total) by mouth daily. 90 tablet 1   fluticasone (FLONASE) 50 MCG/ACT nasal spray Place 2 sprays into both nostrils daily. 16 g 6   montelukast (SINGULAIR) 10 MG tablet TAKE ONE TABLET BY MOUTH EVERYDAY AT BEDTIME 90 tablet 1   pantoprazole (PROTONIX) 40 MG tablet Take 1 tablet (40 mg total) by mouth daily. 30 tablet 1   levonorgestrel-ethinyl estradiol (SEASONALE) 0.15-0.03 MG tablet Take 1 tablet by mouth daily.     No facility-administered medications prior to visit.     ROS:  Review of Systems  Constitutional:  Negative for fatigue, fever and unexpected weight change.  Respiratory:   Positive for shortness of breath. Negative for cough and wheezing.   Cardiovascular:  Positive for chest pain. Negative for palpitations and leg swelling.  Gastrointestinal:  Negative for blood in stool, constipation, diarrhea, nausea and vomiting.  Endocrine: Negative for cold intolerance, heat intolerance and polyuria.  Genitourinary:  Positive for menstrual problem. Negative for dyspareunia, dysuria, flank pain, frequency, genital sores, hematuria, pelvic pain, urgency, vaginal bleeding, vaginal discharge and vaginal pain.  Musculoskeletal:  Negative for back pain, joint swelling and myalgias.  Skin:  Negative for rash.  Neurological:  Positive for headaches. Negative for  dizziness, syncope, light-headedness and numbness.  Hematological:  Negative for adenopathy.  Psychiatric/Behavioral:  Positive for agitation and dysphoric mood. Negative for confusion, sleep disturbance and suicidal ideas. The patient is not nervous/anxious.   BREAST: tenderness   OBJECTIVE:   Vitals:  BP (!) 140/98 (BP Location: Left Arm, Patient Position: Sitting, Cuff Size: Large)    Wt 210 lb (95.3 kg)    LMP 07/23/2021 (Exact Date)    BMI 33.89 kg/m   Physical Exam Vitals reviewed.  Constitutional:      Appearance: She is well-developed.  Pulmonary:     Effort: Pulmonary effort is normal.  Chest:  Breasts:    Breasts are symmetrical.     Right: No inverted nipple, mass, nipple discharge, skin change or tenderness.     Left: No inverted nipple, mass, nipple discharge, skin change or tenderness.  Genitourinary:    General: Normal vulva.     Pubic Area: No rash.      Labia:        Right: No rash, tenderness or lesion.        Left: No rash, tenderness or lesion.      Vagina: Normal. No vaginal discharge, erythema or tenderness.     Cervix: Normal.     Uterus: Normal. Not enlarged and not tender.      Adnexa: Right adnexa normal and left adnexa normal.       Right: No mass or tenderness.         Left: No  mass or tenderness.    Musculoskeletal:        General: Normal range of motion.     Cervical back: Normal range of motion.  Skin:    General: Skin is warm and dry.  Neurological:     General: No focal deficit present.     Mental Status: She is alert and oriented to person, place, and time.     Cranial Nerves: No cranial nerve deficit.  Psychiatric:        Mood and Affect: Mood normal.        Behavior: Behavior normal.        Thought Content: Thought content normal.        Judgment: Judgment normal.    Results: Results for orders placed or performed in visit on 08/28/21 (from the past 24 hour(s))  POCT urine pregnancy     Status: Normal   Collection Time: 08/28/21  3:13 PM  Result Value Ref Range   Preg Test, Ur Negative Negative     Assessment/Plan:  Encounter for annual routine gynecological examination  Cervical cancer screening - Plan: Cytology - PAP  Encounter for other general counseling or advice on contraception--discussed paragard IUD for non-hormonal BC as well as condoms. F/u prn.   Breast tenderness-- No masses on exam; sx improving off BCPs. See if sx improve once period starts. F/u prn.    Irregular menses - Plan: POCT urine pregnancy; neg UPT. See if menses resumes in a couple wks due to irregular use of BCPs. F/u prn.     Return in about 1 year (around 01/24/8314) for annual.  Warren Lindahl B. Macklin Jacquin, PA-C 08/28/2021 3:40 PM

## 2021-08-29 ENCOUNTER — Telehealth: Payer: Self-pay

## 2021-08-29 NOTE — Telephone Encounter (Signed)
Left a VM for the patient per DPR on file, with a reminder for her exercise tolerance test schedule for tomorrow Jan. 6, 2023 at 10:00, and to arrive 15 mins early. Also left the following detailed instructions for her. Encouraged her to call back if she is unable to make it, or if she has any questions.  Reviewed instructions with patient  - you may eat a light breakfast/ lunch prior to your procedure - no caffeine for 24 hours prior to your test (coffee, tea, soft drinks, or chocolate)  - no smoking/ vaping for 4 hours prior to your test - you may take your regular medications the day of your test - wear comfortable clothing & tennis/ non-skid shoes to walk on the treadmill (no house slippers, sandals, or flipflop)   Reminded patient to bring her Inhaler with her to the appointment.

## 2021-09-04 ENCOUNTER — Encounter: Payer: Self-pay | Admitting: Obstetrics and Gynecology

## 2021-09-04 LAB — CYTOLOGY - PAP
Comment: NEGATIVE
Diagnosis: UNDETERMINED — AB
High risk HPV: POSITIVE — AB

## 2021-09-04 NOTE — Telephone Encounter (Signed)
Done on results note  

## 2021-09-09 ENCOUNTER — Encounter: Payer: Self-pay | Admitting: Obstetrics and Gynecology

## 2021-09-09 ENCOUNTER — Other Ambulatory Visit: Payer: Self-pay | Admitting: Obstetrics and Gynecology

## 2021-09-09 MED ORDER — FLUCONAZOLE 150 MG PO TABS: 150.0000 mg | ORAL_TABLET | Freq: Once | ORAL | 0 refills | Status: AC

## 2021-09-09 NOTE — Progress Notes (Signed)
Rx diflucan for yeast vag on pap with sx.

## 2021-09-12 ENCOUNTER — Ambulatory Visit: Payer: Medicaid Other | Admitting: Internal Medicine

## 2021-09-12 NOTE — Progress Notes (Deleted)
Follow-up Outpatient Visit Date: 09/12/2021  Primary Care Provider: Bunnie Pion, [place] [address]  Chief Complaint: ***  HPI:  [name] is a 21 y.o. female with history of asthma, anxiety, depression, and PTSD, who presents for follow-up of chest pain.  I met her in early December for evaluation of chest pain as well as chronic exertional dyspnea.  Pain was felt to be most consistent with GI or musculoskeletal etiology.  I recommended a trial of acetaminophen and pantoprazole.  Echocardiogram showed normal LVEF with mild-moderate mitral and tricuspid regurgitation.  Due to continued chest pain, she was seen in late December by Dr. Fletcher Anon.  At that visit, she reported avoiding getting out of bed every day because of the chest pain, fatigue, and dyspnea.  It was noted that she vapes and uses marijuana on a regular basis.  She was advised to try an NSAID for possible costochondritis.  She was referred for exercise tolerance test, which is still pending.  --------------------------------------------------------------------------------------------------  Cardiovascular History & Procedures: Cardiovascular Problems: Chest pain   Risk Factors: Obesity and family history   Cath/PCI: None   CV Surgery: None   EP Procedures and Devices: None   Non-Invasive Evaluation(s): TTE (08/09/2021): Normal LV size and wall thickness.  LVEF 60-65% with normal wall motion.  Normal GLS and diastolic function.  Normal RV size and function.  Normal PA pressure.  Normal biatrial size.  Mild mitral regurgitation.  Mild-moderate tricuspid regurgitation.  CVP ~8 mmHg.  Recent CV Pertinent Labs: Lab Results  Component Value Date   CHOL 187 (H) 12/06/2018   HDL 50 12/06/2018   LDLCALC 114 (H) 12/06/2018   TRIG 124 (H) 12/06/2018   CHOLHDL 3.7 12/06/2018   BNP 11.0 08/01/2021   K 4.1 08/01/2021   BUN 7 08/01/2021   CREATININE 0.73 08/01/2021   CREATININE 0.86 12/20/2018     Past medical and surgical history were reviewed and updated in EPIC.  No outpatient medications have been marked as taking for the 09/12/21 encounter (Appointment) with Teighlor Korson, Harrell Gave, MD.    Allergies: Patient has no known allergies.  Social History   Tobacco Use   Smoking status: Former    Packs/day: 0.25    Years: 2.00    Pack years: 0.50    Types: Cigarettes, E-cigarettes    Start date: 03/01/2017    Quit date: 07/11/2020    Years since quitting: 1.1   Smokeless tobacco: Never   Tobacco comments:    patient states she vapes once a day  Vaping Use   Vaping Use: Every day   Start date: 03/06/2017  Substance Use Topics   Alcohol use: Yes    Comment: occassionally   Drug use: Yes    Frequency: 7.0 times per week    Types: Marijuana    Comment: occassionally    Family History  Problem Relation Age of Onset   Hyperlipidemia Mother    Endometriosis Mother    Anxiety disorder Mother    Depression Mother    Post-traumatic stress disorder Mother    Post-traumatic stress disorder Father    Depression Father    Anxiety disorder Father    Hyperlipidemia Father    Heart disease Father    Heart attack Father 62   Heart failure Father    Asthma Father    Hypertension Father    ADD / ADHD Brother     Review of Systems: A 12-system review of systems was performed and  was negative except as noted in the HPI.  --------------------------------------------------------------------------------------------------  Physical Exam: There were no vitals taken for this visit.  General:  NAD. Neck: No JVD or HJR. Lungs: Clear to auscultation bilaterally without wheezes or crackles. Heart: Regular rate and rhythm without murmurs, rubs, or gallops. Abdomen: Soft, nontender, nondistended. Extremities: No lower extremity edema.  EKG:  ***  Lab Results  Component Value Date   WBC 5.7 08/01/2021   HGB 12.0 08/01/2021   HCT 36.5 08/01/2021   MCV 84.1 08/01/2021   PLT 349  08/01/2021    Lab Results  Component Value Date   NA 137 08/01/2021   K 4.1 08/01/2021   CL 106 08/01/2021   CO2 25 08/01/2021   BUN 7 08/01/2021   CREATININE 0.73 08/01/2021   GLUCOSE 93 08/01/2021   ALT 47 (H) 08/01/2021    Lab Results  Component Value Date   CHOL 187 (H) 12/06/2018   HDL 50 12/06/2018   LDLCALC 114 (H) 12/06/2018   TRIG 124 (H) 12/06/2018   CHOLHDL 3.7 12/06/2018    --------------------------------------------------------------------------------------------------  ASSESSMENT AND PLAN: Harrell Gave Miller Limehouse, MD 09/12/2021 7:20 AM

## 2021-09-13 ENCOUNTER — Encounter: Payer: Self-pay | Admitting: Internal Medicine

## 2021-09-15 ENCOUNTER — Other Ambulatory Visit: Payer: Self-pay

## 2021-09-15 ENCOUNTER — Emergency Department: Payer: Medicaid Other

## 2021-09-15 ENCOUNTER — Encounter: Payer: Self-pay | Admitting: Emergency Medicine

## 2021-09-15 DIAGNOSIS — J45909 Unspecified asthma, uncomplicated: Secondary | ICD-10-CM | POA: Diagnosis not present

## 2021-09-15 DIAGNOSIS — O26891 Other specified pregnancy related conditions, first trimester: Secondary | ICD-10-CM | POA: Diagnosis present

## 2021-09-15 DIAGNOSIS — Z3A01 Less than 8 weeks gestation of pregnancy: Secondary | ICD-10-CM | POA: Insufficient documentation

## 2021-09-15 DIAGNOSIS — O200 Threatened abortion: Secondary | ICD-10-CM | POA: Diagnosis not present

## 2021-09-15 DIAGNOSIS — O209 Hemorrhage in early pregnancy, unspecified: Secondary | ICD-10-CM

## 2021-09-15 LAB — COMPREHENSIVE METABOLIC PANEL
ALT: 12 U/L (ref 0–44)
AST: 12 U/L — ABNORMAL LOW (ref 15–41)
Albumin: 4.1 g/dL (ref 3.5–5.0)
Alkaline Phosphatase: 82 U/L (ref 38–126)
Anion gap: 5 (ref 5–15)
BUN: 9 mg/dL (ref 6–20)
CO2: 26 mmol/L (ref 22–32)
Calcium: 9.2 mg/dL (ref 8.9–10.3)
Chloride: 105 mmol/L (ref 98–111)
Creatinine, Ser: 0.71 mg/dL (ref 0.44–1.00)
GFR, Estimated: >60 mL/min (ref >60)
Glucose, Bld: 89 mg/dL (ref 70–99)
Potassium: 3.8 mmol/L (ref 3.5–5.1)
Sodium: 136 mmol/L (ref 135–145)
Total Bilirubin: 0.6 mg/dL (ref 0.3–1.2)
Total Protein: 7.5 g/dL (ref 6.5–8.1)

## 2021-09-15 LAB — CBC WITH DIFFERENTIAL/PLATELET
Abs Immature Granulocytes: 0.01 10*3/uL (ref 0.00–0.07)
Basophils Absolute: 0.1 10*3/uL (ref 0.0–0.1)
Basophils Relative: 1 %
Eosinophils Absolute: 0.3 10*3/uL (ref 0.0–0.5)
Eosinophils Relative: 4 %
HCT: 37.4 % (ref 36.0–46.0)
Hemoglobin: 12.1 g/dL (ref 12.0–15.0)
Immature Granulocytes: 0 %
Lymphocytes Relative: 41 %
Lymphs Abs: 3.2 10*3/uL (ref 0.7–4.0)
MCH: 27.8 pg (ref 26.0–34.0)
MCHC: 32.4 g/dL (ref 30.0–36.0)
MCV: 85.8 fL (ref 80.0–100.0)
Monocytes Absolute: 0.5 10*3/uL (ref 0.1–1.0)
Monocytes Relative: 7 %
Neutro Abs: 3.8 10*3/uL (ref 1.7–7.7)
Neutrophils Relative %: 47 %
Platelets: 332 10*3/uL (ref 150–400)
RBC: 4.36 MIL/uL (ref 3.87–5.11)
RDW: 14.4 % (ref 11.5–15.5)
WBC: 8 10*3/uL (ref 4.0–10.5)
nRBC: 0 % (ref 0.0–0.2)

## 2021-09-15 LAB — URINALYSIS, ROUTINE W REFLEX MICROSCOPIC
Bilirubin Urine: NEGATIVE
Glucose, UA: NEGATIVE mg/dL
Hgb urine dipstick: NEGATIVE
Ketones, ur: NEGATIVE mg/dL
Leukocytes,Ua: NEGATIVE
Nitrite: NEGATIVE
Protein, ur: NEGATIVE mg/dL
Specific Gravity, Urine: 1.019 (ref 1.005–1.030)
pH: 6 (ref 5.0–8.0)

## 2021-09-15 LAB — HCG, QUANTITATIVE, PREGNANCY: hCG, Beta Chain, Quant, S: 9348 m[IU]/mL — ABNORMAL HIGH (ref <5)

## 2021-09-15 LAB — PREGNANCY, URINE: Preg Test, Ur: POSITIVE — AB

## 2021-09-15 NOTE — ED Triage Notes (Signed)
G2, P1, LMP 07/23/21.  Pt had positive home test on Thurs.  Yesterday had mild cramping and today had a small amount of spotting throughout the day.  Pt does endorse nausea and diarrhea.

## 2021-09-16 ENCOUNTER — Emergency Department: Payer: Medicaid Other

## 2021-09-16 ENCOUNTER — Ambulatory Visit (INDEPENDENT_AMBULATORY_CARE_PROVIDER_SITE_OTHER): Payer: Medicaid Other | Admitting: Licensed Practical Nurse

## 2021-09-16 ENCOUNTER — Ambulatory Visit: Payer: Medicaid Other | Admitting: Licensed Practical Nurse

## 2021-09-16 ENCOUNTER — Encounter: Payer: Self-pay | Admitting: Licensed Practical Nurse

## 2021-09-16 ENCOUNTER — Emergency Department
Admission: EM | Admit: 2021-09-16 | Discharge: 2021-09-16 | Disposition: A | Discharge status: Home / Self Care | Payer: Medicaid Other | Attending: Emergency Medicine | Admitting: Emergency Medicine

## 2021-09-16 VITALS — BP 126/84 | Wt 208.0 lb

## 2021-09-16 DIAGNOSIS — O200 Threatened abortion: Secondary | ICD-10-CM

## 2021-09-16 DIAGNOSIS — O209 Hemorrhage in early pregnancy, unspecified: Secondary | ICD-10-CM

## 2021-09-16 NOTE — ED Provider Notes (Signed)
[facility] Provider Note    Event Date/Time   First MD Initiated Contact with Patient 09/16/21 0020     (approximate)   History   Vaginal Bleeding and Possible Pregnancy   HPI  Grace [name] is a 21 y.o. female who presents for evaluation of cramping and spotting.  Patient reports having a positive pregnancy test at home this week.  Her last menstrual period was 7 weeks ago.  She is complaining of suprapubic mild cramping that feels like a period.  Has had some spotting of dark brown blood.  She has not seen her OB for this pregnancy yet.  No nausea or vomiting, no vaginal discharge     Past Medical History:  Diagnosis Date   Acne    Acute nonintractable headache 06/12/2020   Allergy    ALLERGIC RHINITIS   Anxiety    Asthma    Deliberate self-cutting    Depression    Fibrocystic disease of breast    Head pain    History of depression 12/20/2015   History of self-harm    Knee pain, right    Primary dysmenorrhea    Severe myopia of both eyes     Past Surgical History:  Procedure Laterality Date   LAPAROSCOPY  05/07/2017   Procedure: LAPAROSCOPY DIAGNOSTIC;  Surgeon: Suzy Bouchard, MD;  Location: [facility] ORS;  Service: Gynecology;;     Physical Exam   Triage Vital Signs: ED Triage Vitals [09/15/21 2142]  Enc Vitals Group     BP 139/85     Pulse Rate 82     Resp 20     Temp 98.9 F (37.2 C)     Temp Source Oral     SpO2 99 %     Weight      Height      Head Circumference      Peak Flow      Pain Score 1     Pain Loc      Pain Edu?      Excl. in GC?     Most recent vital signs: Vitals:   09/15/21 2142 09/16/21 0042  BP: 139/85 132/86  Pulse: 82 88  Resp: 20 19  Temp: 98.9 F (37.2 C)   SpO2: 99% 99%     Constitutional: Alert and oriented. Well appearing and in no apparent distress. HEENT:      Head: Normocephalic and atraumatic.         Eyes: Conjunctivae are normal. Sclera is non-icteric.        Mouth/Throat: Mucous membranes are moist.       Neck: Supple with no signs of meningismus. Cardiovascular: Regular rate and rhythm. No murmurs, gallops, or rubs. 2+ symmetrical distal pulses are present in all extremities.  Respiratory: Normal respiratory effort. Lungs are clear to auscultation bilaterally.  Gastrointestinal: Soft, non tender, and non distended with positive bowel sounds. No rebound or guarding. Genitourinary: No CVA tenderness. Musculoskeletal:  No edema, cyanosis, or erythema of extremities. Neurologic: Normal speech and language. Face is symmetric. Moving all extremities. No gross focal neurologic deficits are appreciated. Skin: Skin is warm, dry and intact. No rash noted. Psychiatric: Mood and affect are normal. Speech and behavior are normal.  ED Results / Procedures / Treatments   Labs (all labs ordered are listed, but only abnormal results are displayed) Labs Reviewed  PREGNANCY, URINE - Abnormal; Notable for the following components:      Result Value   Preg  Test, Ur POSITIVE (*)    All other components within normal limits  URINALYSIS, ROUTINE W REFLEX MICROSCOPIC - Abnormal; Notable for the following components:   Color, Urine YELLOW (*)    APPearance HAZY (*)    All other components within normal limits  COMPREHENSIVE METABOLIC PANEL - Abnormal; Notable for the following components:   AST 12 (*)    All other components within normal limits  HCG, QUANTITATIVE, PREGNANCY - Abnormal; Notable for the following components:   hCG, Beta Chain, Quant, S 9,348 (*)    All other components within normal limits  CBC WITH DIFFERENTIAL/PLATELET  ABO/RH     EKG  none   RADIOLOGY I, Nita Sicklearolina Luxe Cuadros, attending MD, have personally viewed and interpreted the images obtained during this visit as below:  Gestational sac seen on transvaginal ultrasound with no fetal pole   ___________________________________________________ Interpretation by Radiologist:  US OB  LESS THAN 14 WEEKS WITH OB TRANSVAGINAL  Result Date: 09/16/2021 CLINICAL DATA:  Vaginal bleeding. LMP: 07/23/2021 corresponding to an estimated gestational age of [redacted] weeks, 6 days. EXAM: OBSTETRIC <14 WK US AND TRANSVAGINAL OB US TECHNIQUE: Both transabdominal and transvaginal ultrasound examinations were performed for complete evaluation of the gestation as well as the maternal uterus, adnexal regions, and pelvic cul-de-sac. Transvaginal technique was performed to assess early pregnancy. COMPARISON:  None. FINDINGS: Intrauterine gestational sac: Single intrauterine gestational sac. Yolk sac:  Seen Embryo:  Not identified with certainty at this time. Cardiac Activity: N/A Heart Rate: N/A MSD: 9 mm   5 w   5 d Subchorionic hemorrhage:  None visualized. Maternal uterus/adnexae: The right ovary is not visualized. The left ovary is unremarkable. IMPRESSION: Single intrauterine gestational sac with an estimated gestational age of [redacted] weeks, 5 days. No fetal pole identified at this time. Follow-up with ultrasound in 7-11 days, or earlier if clinically indicated, recommended. Electronically Signed   By: Elgie CollardArash  Radparvar M.D.   On: 09/16/2021 00:51      PROCEDURES:  Critical Care performed: No  Procedures    IMPRESSION / MDM / ASSESSMENT AND PLAN / ED COURSE  I reviewed the triage vital signs and the nursing notes.  21 y.o. female who presents for evaluation of cramping and spotting after positive pregnancy test at home.  On exam she is extremely well-appearing in no distress with normal vital signs, abdomen soft and nontender.  Ddx: Bleeding for trimester versus subchorionic hemorrhage versus ectopic pregnancy versus miscarriage versus STD   Plan: Transvaginal ultrasound, chemistry panel, CBC, a BHR oh, hCG, urinalysis   MEDICATIONS GIVEN IN ED: Medications - No data to display   ED COURSE: Transvaginal ultrasound showing a 5-week gestational sac with no fetal pole.  Patient is hemodynamically  stable with a hemoglobin of 12.1 and no signs of acute blood loss anemia.  Blood type B+ no indication for RhoGAM.  Beta quant elevated appropriately at 9000.  Discussed recommendations of a repeat ultrasound in 7 to 10 days.  [facility]  She will call this morning for an appointment.  We discussed management at home of her symptoms and indications for return to the emergency room.  No indication for admission at this time with small amount of bleeding, no signs of ectopic pregnancy, and hemodynamically stability.   Consults: None   EMR reviewed including patient's last visit with her OB/GYN from 20 days ago.       FINAL CLINICAL IMPRESSION(S) / ED DIAGNOSES   Final diagnoses:  Vaginal  bleeding affecting early pregnancy  Threatened miscarriage in early pregnancy     Rx / DC Orders   ED Discharge Orders     None        Note:  This document was prepared using Dragon voice recognition software and may include unintentional dictation errors.   Don Perking, [place], MD 09/16/21 0120

## 2021-09-16 NOTE — Progress Notes (Signed)
NOB today. LMP 07/23/2021

## 2021-09-16 NOTE — Progress Notes (Signed)
Patient ID: Grace [name], female   DOB: 2001-05-31, 21 y.o.   MRN: [id number]  Reason for Consult: Routine Prenatal Visit   Referred by Bunnie Pion, FNP  Subjective:     Grace [name] here to establish prenatal care, but reports being seen in the ED yesterday had an inconclusive Korea, visit converted to problem visit.   HPI: Grace [name] was using OCP, but admits to not always being consistent with timing of taking her pill. Reports an LMP of 07/23/2021.  Is unsure about how she feels about being pregnant as they have a 22 year old  at home. Was experiencing cramping and spotting so she went to the ED. Korea in the ED  showed FINDINGS: Intrauterine gestational sac: Single intrauterine gestational sac. Yolk sac:  Seen Embryo:  Not identified with certainty at this time. Cardiac Activity: N/A Heart Rate: N/A MSD: 9 mm   5 w   5 d Subchorionic hemorrhage:  None visualized. Maternal uterus/adnexae: The right ovary is not visualized. The left ovary is unremarkable. IMPRESSION: Single intrauterine gestational sac with an estimated gestational age of [redacted] weeks, 5 days. No fetal pole identified at this time  Today Grace [name] denies any cramping or bleeding, declines PE as she just had one in the beginning of this month.   Grace [name] is a 21 y.o. female with a threatened abortion.   Gynecological History  Patient's last menstrual period was 07/23/2021 (exact date).  She has had  HPV vaccination in the past.  Last Pap: Results were: 2023  ASC-US, HPV positive     Obstetrical History G3P1011 SVB 07/26/2020 at [redacted]w[redacted]d Reports 1 early miscarriage without any complications  Past Medical History:  Diagnosis Date   Acne    Acute nonintractable headache 06/12/2020   Allergy    ALLERGIC RHINITIS   Anxiety    Asthma    Deliberate self-cutting    Depression    Fibrocystic disease of breast    Head pain    History of depression 12/20/2015   History of self-harm    Knee pain, right    Primary dysmenorrhea     Severe myopia of both eyes    Family History  Problem Relation Age of Onset   Hyperlipidemia Mother    Endometriosis Mother    Anxiety disorder Mother    Depression Mother    Post-traumatic stress disorder Mother    Post-traumatic stress disorder Father    Depression Father    Anxiety disorder Father    Hyperlipidemia Father    Heart disease Father    Heart attack Father 470  Heart failure Father    Asthma Father    Hypertension Father    ADD / ADHD Brother    Past Surgical History:  Procedure Laterality Date   LAPAROSCOPY  05/07/2017   Procedure: LAPAROSCOPY DIAGNOSTIC;  Surgeon: Schermerhorn, TGwen Her MD;  Location: [facility] ORS;  Service: Gynecology;;    Short Social History:  Social History   Tobacco Use   Smoking status: Former    Packs/day: 0.25    Years: 2.00    Pack years: 0.50    Types: Cigarettes, E-cigarettes    Start date: 03/01/2017    Quit date: 07/11/2020    Years since quitting: 1.1   Smokeless tobacco: Never   Tobacco comments:    patient states she vapes once a day  Substance Use Topics   Alcohol use: Yes    Comment: occassionally    No Known Allergies  Current Outpatient Medications  Medication Sig Dispense Refill   acetaminophen (TYLENOL) 325 MG tablet Take 2 tablets (650 mg total) by mouth every 4 (four) hours as needed (for pain scale < 4).     albuterol (VENTOLIN HFA) 108 (90 Base) MCG/ACT inhaler Inhale 2 puffs into the lungs every 6 (six) hours as needed for wheezing or shortness of breath. 8 [name] 0   cetirizine (ZYRTEC) 10 MG tablet Take 1 tablet (10 mg total) by mouth daily. 90 tablet 1   montelukast (SINGULAIR) 10 MG tablet TAKE ONE TABLET BY MOUTH EVERYDAY AT BEDTIME 90 tablet 1   pantoprazole (PROTONIX) 40 MG tablet Take 1 tablet (40 mg total) by mouth daily. 30 tablet 1   Blood Pressure KIT 1 kit by Does not apply route daily. Notify provider for blood pressures less than 90/50 or 160/110 or greater (Patient not taking: Reported on  09/16/2021) 1 kit 0   fluticasone (FLONASE) 50 MCG/ACT nasal spray Place 2 sprays into both nostrils daily. (Patient not taking: Reported on 09/16/2021) 16 [name] 6   levonorgestrel-ethinyl estradiol (SEASONALE) 0.15-0.03 MG tablet Take 1 tablet by mouth daily.     No current facility-administered medications for this visit.    REVIEW OF SYSTEMS brown spotting yesterday, cramping over the last few days      Objective:  Objective   Vitals:   09/16/21 1447  BP: 126/84  Weight: 208 lb (94.3 kg)   Body mass index is 33.57 kg/m.  Physical Exam HENT:     Mouth/Throat:     Mouth: Mucous membranes are moist.  Pulmonary:     Effort: Pulmonary effort is normal.  Neurological:     Mental Status: She is alert and oriented to person, place, and time.  Psychiatric:        Mood and Affect: Mood normal.     Comments: Affect a little flat, but also reports not knowing how she feels about current situation     Assessment/Plan:   Threatened Abortion  Repeat US in 2 weeks -Discussed options if this is a MAB -Plan routine prenatal care if this is a viable pregnancy -Warning signs given    Roberto Scales, [place], [facility]  09/16/21  3:28 PM

## 2021-09-24 ENCOUNTER — Telehealth: Payer: Self-pay

## 2021-09-24 ENCOUNTER — Ambulatory Visit (INDEPENDENT_AMBULATORY_CARE_PROVIDER_SITE_OTHER): Payer: Medicaid Other | Admitting: Licensed Practical Nurse

## 2021-09-24 ENCOUNTER — Other Ambulatory Visit: Payer: Self-pay

## 2021-09-24 DIAGNOSIS — Z3A09 9 weeks gestation of pregnancy: Secondary | ICD-10-CM

## 2021-09-24 DIAGNOSIS — O9A311 Physical abuse complicating pregnancy, first trimester: Secondary | ICD-10-CM
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
## 2021-10-01 ENCOUNTER — Ambulatory Visit
Admission: RE | Admit: 2021-10-01 | Discharge: 2021-10-01 | Disposition: A | Discharge status: Home / Self Care | Payer: Medicaid Other | Source: Ambulatory Visit | Attending: Licensed Practical Nurse | Admitting: Licensed Practical Nurse

## 2021-10-01 ENCOUNTER — Other Ambulatory Visit: Payer: Self-pay

## 2021-10-01 DIAGNOSIS — O200 Threatened abortion: Secondary | ICD-10-CM | POA: Insufficient documentation

## 2021-10-03 ENCOUNTER — Other Ambulatory Visit: Payer: Medicaid Other

## 2021-10-07 ENCOUNTER — Other Ambulatory Visit: Payer: Self-pay

## 2021-10-07 ENCOUNTER — Encounter: Payer: Self-pay | Admitting: Licensed Practical Nurse

## 2021-10-07 ENCOUNTER — Ambulatory Visit (INDEPENDENT_AMBULATORY_CARE_PROVIDER_SITE_OTHER): Payer: Medicaid Other | Admitting: Licensed Practical Nurse

## 2021-10-07 VITALS — BP 120/80 | Wt 205.0 lb

## 2021-10-07 DIAGNOSIS — Z3480 Encounter for supervision of other normal pregnancy, unspecified trimester: Secondary | ICD-10-CM

## 2021-10-07 DIAGNOSIS — Z3A08 8 weeks gestation of pregnancy: Secondary | ICD-10-CM

## 2021-10-07 DIAGNOSIS — Z113 Encounter for screening for infections with a predominantly sexual mode of transmission: Secondary | ICD-10-CM

## 2021-10-07 HISTORY — DX: Encounter for supervision of other normal pregnancy, unspecified trimester: Z34.80
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
## 2021-10-08 NOTE — Telephone Encounter (Signed)
This encounter was created in error - please disregard.

## 2021-10-09 LAB — GC/CHLAMYDIA PROBE AMP
Chlamydia trachomatis, NAA: NEGATIVE
Neisseria Gonorrhoeae by PCR: NEGATIVE

## 2021-10-09 LAB — URINE CULTURE

## 2021-10-13 LAB — URINE DRUG PANEL 7
Amphetamines, Urine: NEGATIVE ng/mL
Barbiturate Quant, Ur: NEGATIVE ng/mL
Benzodiazepine Quant, Ur: NEGATIVE ng/mL
Cannabinoid Quant, Ur: POSITIVE — AB
Cocaine (Metab.): NEGATIVE ng/mL
Opiate Quant, Ur: NEGATIVE ng/mL
PCP Quant, Ur: NEGATIVE ng/mL

## 2021-10-15 ENCOUNTER — Encounter: Payer: Self-pay | Admitting: Licensed Practical Nurse

## 2021-10-17 ENCOUNTER — Other Ambulatory Visit: Payer: Self-pay | Admitting: Licensed Practical Nurse

## 2021-10-18 ENCOUNTER — Other Ambulatory Visit: Payer: Self-pay

## 2021-10-18 ENCOUNTER — Other Ambulatory Visit: Payer: Self-pay | Admitting: Licensed Practical Nurse

## 2021-10-18 ENCOUNTER — Other Ambulatory Visit: Payer: Medicaid Other

## 2021-10-18 DIAGNOSIS — Z3A10 10 weeks gestation of pregnancy: Secondary | ICD-10-CM

## 2021-10-18 DIAGNOSIS — Z3480 Encounter for supervision of other normal pregnancy, unspecified trimester: Secondary | ICD-10-CM

## 2021-10-18 DIAGNOSIS — F17210 Nicotine dependence, cigarettes, uncomplicated: Secondary | ICD-10-CM

## 2021-10-18 DIAGNOSIS — Z1379 Encounter for other screening for genetic and chromosomal anomalies: Secondary | ICD-10-CM

## 2021-10-18 LAB — OB RESULTS CONSOLE VARICELLA ZOSTER ANTIBODY, IGG: Varicella: IMMUNE

## 2021-10-18 MED ORDER — NICOTINE 21 MG/24HR TD PT24
21.0000 mg | MEDICATED_PATCH | Freq: Every day | TRANSDERMAL | 0 refills | Status: AC
Start: 2021-10-18 — End: 2021-11-29

## 2021-10-21 ENCOUNTER — Other Ambulatory Visit: Payer: Medicaid Other

## 2021-10-23 ENCOUNTER — Encounter: Payer: Self-pay | Admitting: Licensed Practical Nurse

## 2021-10-23 LAB — MATERNIT 21 PLUS CORE, BLOOD
Fetal Fraction: 12
Result (T21): NEGATIVE
Trisomy 13 (Patau syndrome): NEGATIVE
Trisomy 18 (Edwards syndrome): NEGATIVE
Trisomy 21 (Down syndrome): NEGATIVE

## 2021-10-23 LAB — CBC/D/PLT+RPR+RH+ABO+RUBIGG...
Antibody Screen: NEGATIVE
Basophils Absolute: 0.1 10*3/uL (ref 0.0–0.2)
Basos: 1 %
EOS (ABSOLUTE): 0.1 10*3/uL (ref 0.0–0.4)
Eos: 1 %
HCV Ab: NONREACTIVE
HIV Screen 4th Generation wRfx: NONREACTIVE
Hematocrit: 37.6 % (ref 34.0–46.6)
Hemoglobin: 12.3 g/dL (ref 11.1–15.9)
Hepatitis B Surface Ag: NEGATIVE
Immature Grans (Abs): 0 10*3/uL (ref 0.0–0.1)
Immature Granulocytes: 0 %
Lymphocytes Absolute: 2.5 10*3/uL (ref 0.7–3.1)
Lymphs: 45 %
MCH: 28 pg (ref 26.6–33.0)
MCHC: 32.7 g/dL (ref 31.5–35.7)
MCV: 86 fL (ref 79–97)
Monocytes Absolute: 0.3 10*3/uL (ref 0.1–0.9)
Monocytes: 6 %
Neutrophils Absolute: 2.6 10*3/uL (ref 1.4–7.0)
Neutrophils: 47 %
Platelets: 316 10*3/uL (ref 150–450)
RBC: 4.4 x10E6/uL (ref 3.77–5.28)
RDW: 14.2 % (ref 11.7–15.4)
RPR Ser Ql: NONREACTIVE
Rh Factor: POSITIVE
Rubella Antibodies, IGG: 2.52 index (ref >0.99)
Varicella zoster IgG: 348 index (ref >165)
WBC: 5.4 10*3/uL (ref 3.4–10.8)

## 2021-10-23 LAB — HGB FRACTIONATION CASCADE
Hgb A2: 3 % (ref 1.8–3.2)
Hgb A: 96.7 % (ref 96.4–98.8)
Hgb F: 0.3 % (ref 0.0–2.0)
Hgb S: 0 %

## 2021-10-23 LAB — HEMOGLOBIN A1C
Est. average glucose Bld gHb Est-mCnc: 105 mg/dL
Hgb A1c MFr Bld: 5.3 % (ref 4.8–5.6)

## 2021-10-23 LAB — HCV INTERPRETATION

## 2021-10-26 IMAGING — US US OB < 14 WEEKS - US OB TV
1 series · 14 of 28 positions shown · non-contrast
Comparison: None.

CLINICAL DATA: Pelvic pain.

EXAM:
OBSTETRIC <14 WK US AND TRANSVAGINAL OB US
TECHNIQUE: Both transabdominal and transvaginal ultrasound examinations were
performed for complete evaluation of the gestation as well as the
maternal uterus, adnexal regions, and pelvic cul-de-sac.
Transvaginal technique was performed to assess early pregnancy.

[Series 1: ob us · 14 of 80 slices shown]
[im 3/80]
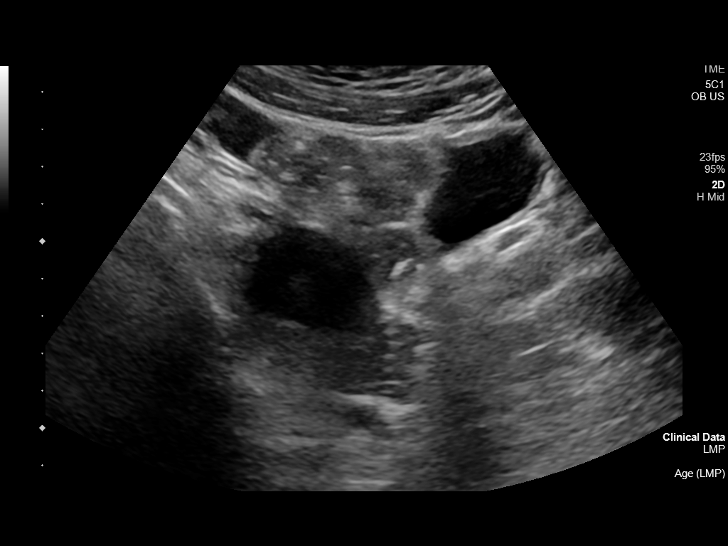
[im 9/80]
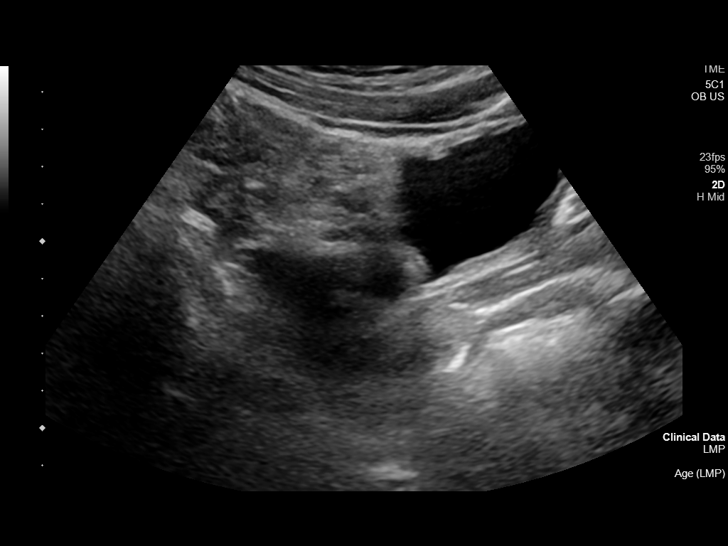
[im 15/80]
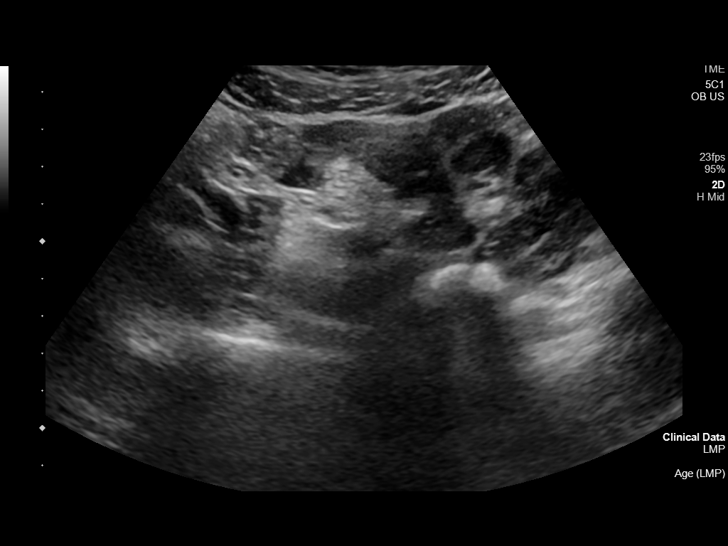
[im 21/80]
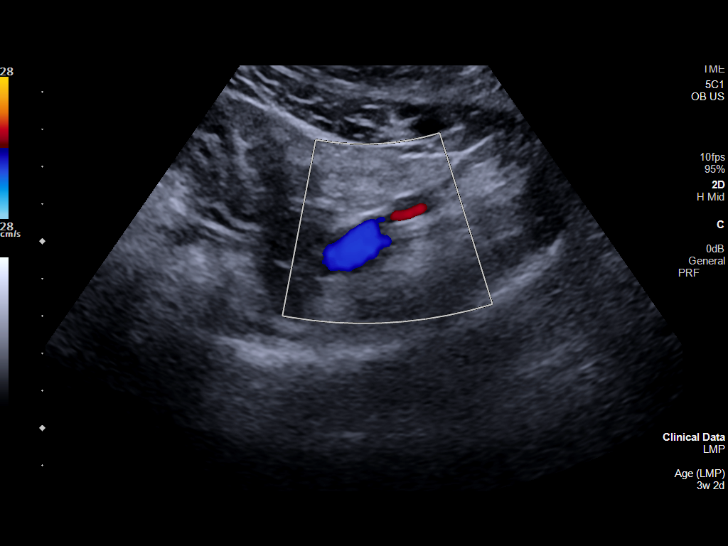
[im 27/80]
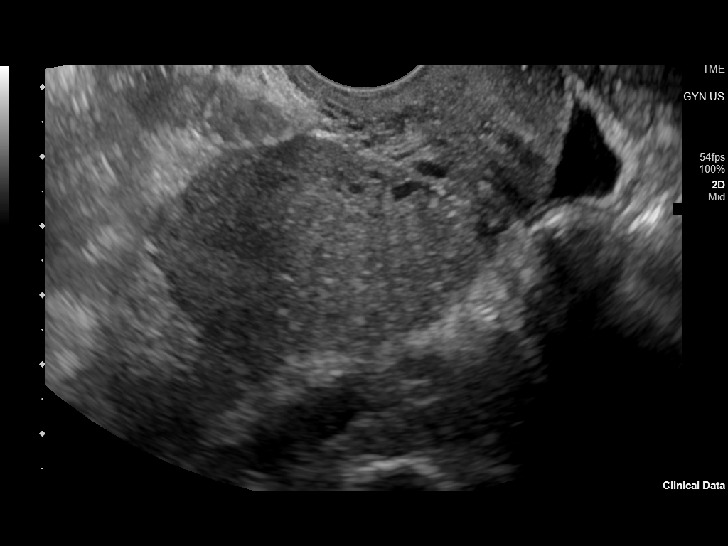
[im 33/80]
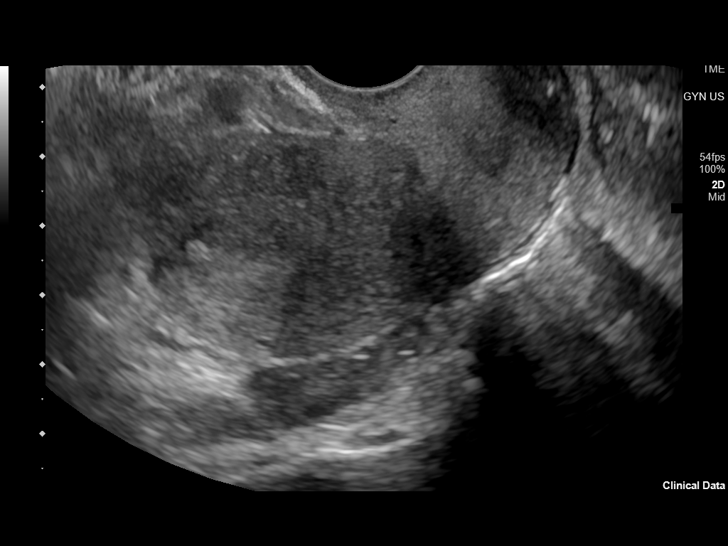
[im 39/80]
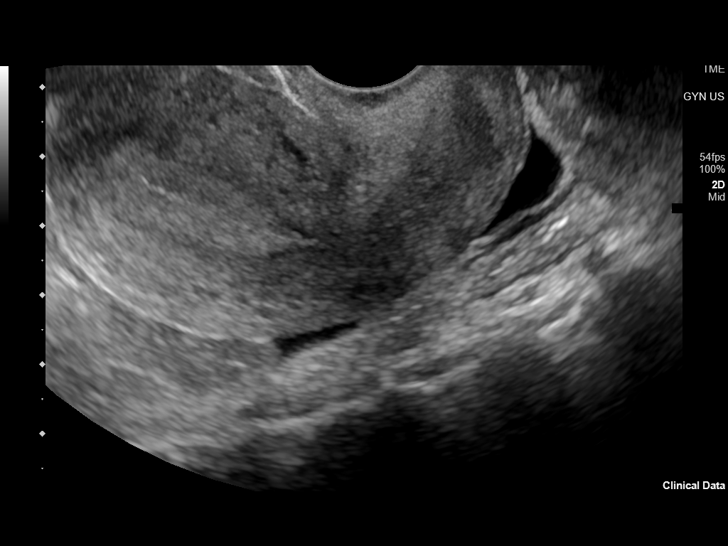
[im 44/80]
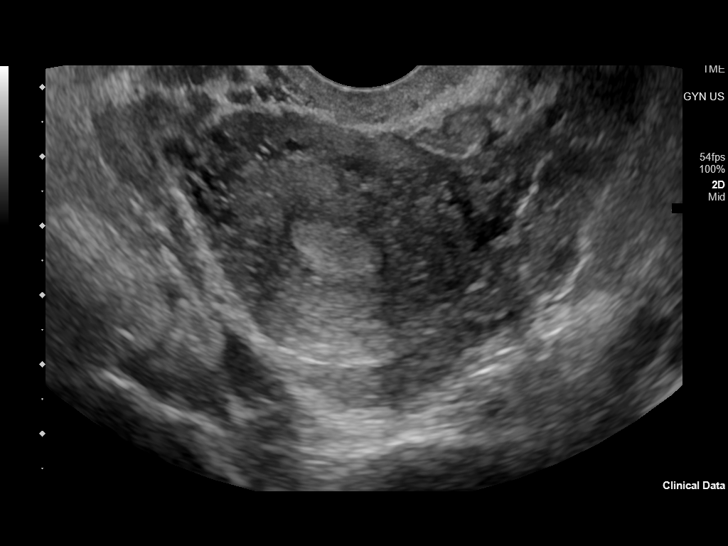
[im 50/80]
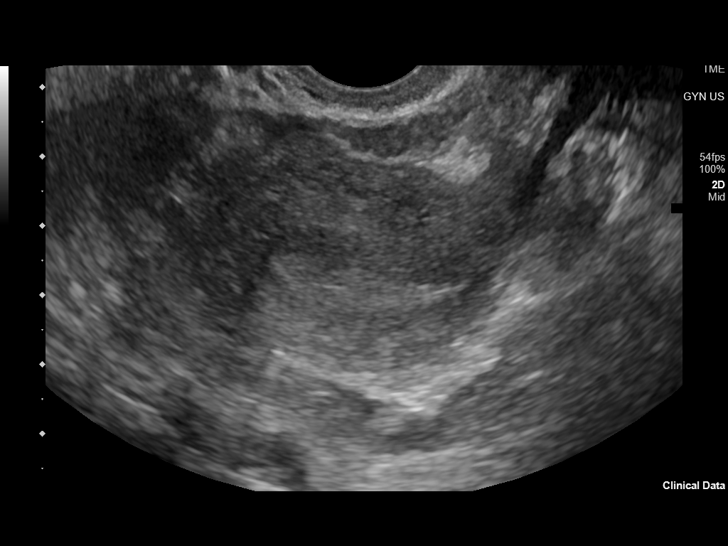
[im 56/80]
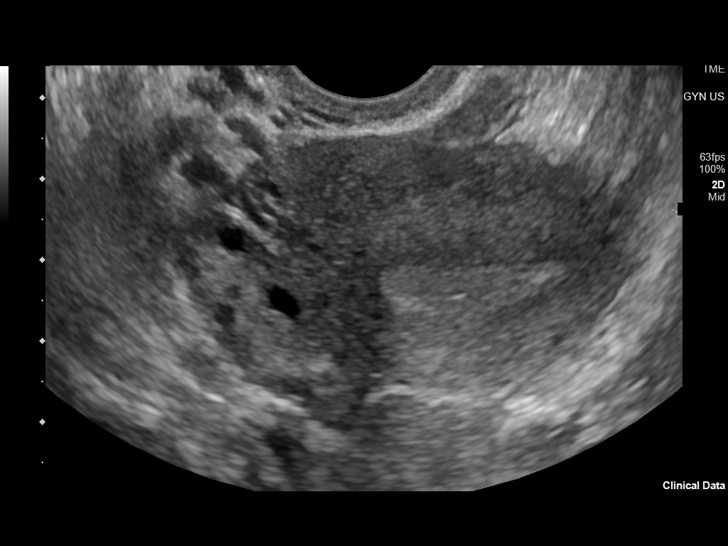
[im 62/80]
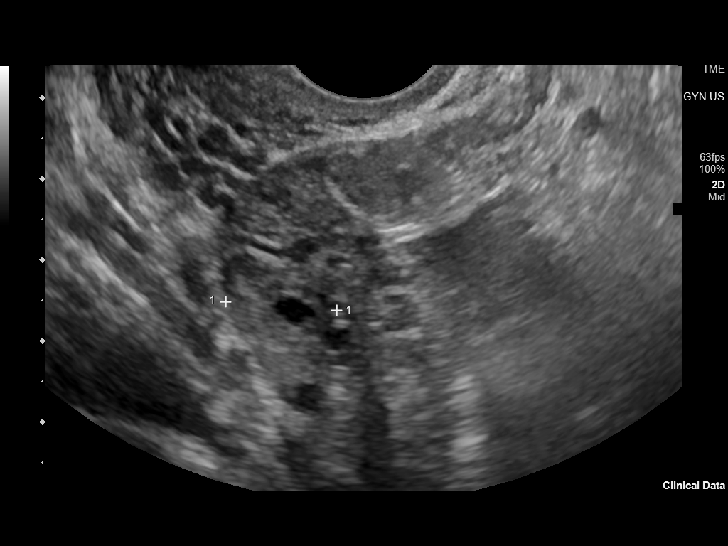
[im 68/80]
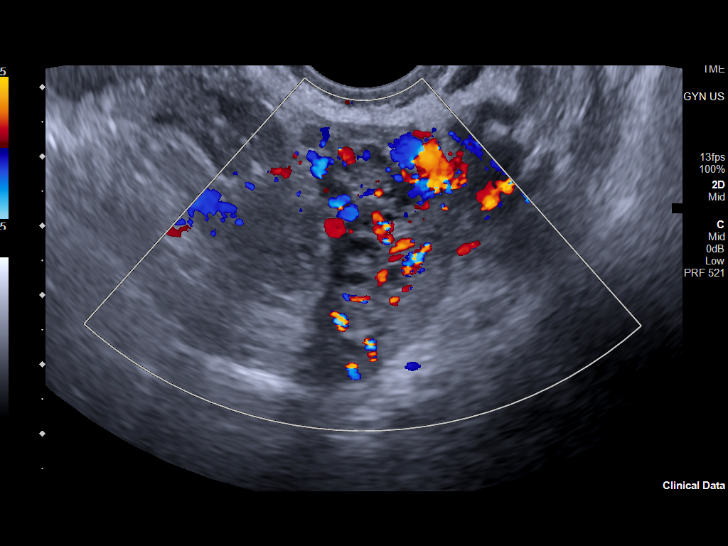
[im 74/80]
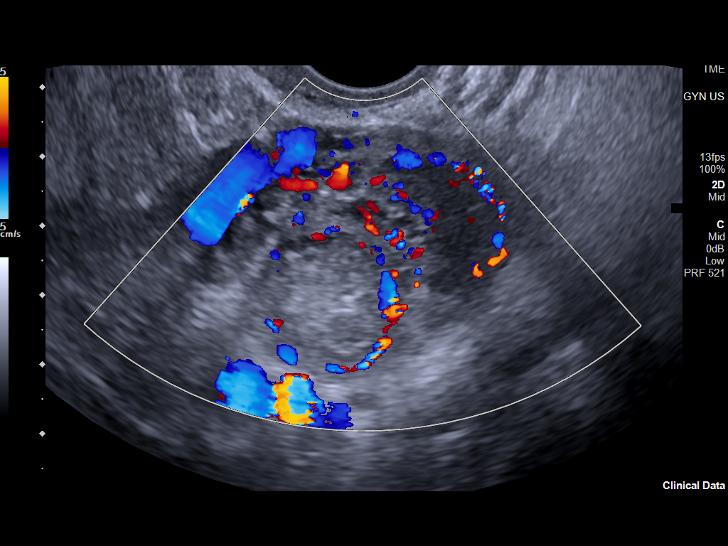
[im 80/80]
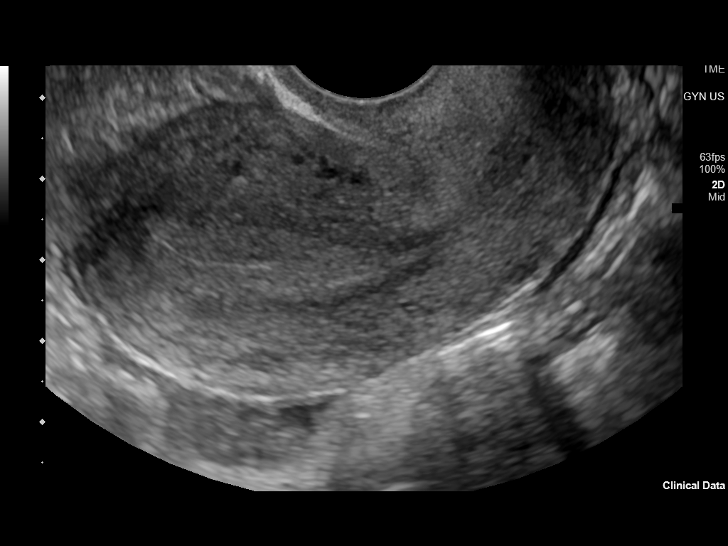

[14 of 28 positions shown; findings below may reference images not displayed]

FINDINGS: Intrauterine gestational sac: None

Yolk sac:  Not Visualized.

Embryo:  Not Visualized.

Cardiac Activity: Not Visualized.

Heart Rate: N/A  bpm

Subchorionic hemorrhage:  None visualized.

Maternal uterus/adnexae: The bilateral ovaries are normal in
appearance.

A trace amount of pelvic fluid is seen.
IMPRESSION: No evidence to suggest the presence of an intrauterine pregnancy.
This may be due to early gestational age. As a result, correlation
with follow-up pelvic ultrasound is recommended.

## 2021-11-04 ENCOUNTER — Other Ambulatory Visit: Payer: Self-pay

## 2021-11-04 ENCOUNTER — Ambulatory Visit (INDEPENDENT_AMBULATORY_CARE_PROVIDER_SITE_OTHER): Payer: Medicaid Other | Admitting: Licensed Practical Nurse

## 2021-11-04 VITALS — BP 120/72 | Wt 206.0 lb

## 2021-11-04 DIAGNOSIS — Z3A12 12 weeks gestation of pregnancy: Secondary | ICD-10-CM

## 2021-11-04 DIAGNOSIS — Z3480 Encounter for supervision of other normal pregnancy, unspecified trimester: Secondary | ICD-10-CM

## 2021-11-04 LAB — POCT URINALYSIS DIPSTICK OB
Glucose, UA: NEGATIVE
POC,PROTEIN,UA: NEGATIVE

## 2021-11-04 NOTE — Progress Notes (Signed)
Routine Prenatal Care Visit ? ?Subjective  ?Grace [name] is a 21 y.o. [phone] at [redacted]w[redacted]d being seen today for ongoing prenatal care.  She is currently monitored for the following issues for this low-risk pregnancy and has Chronic allergic rhinitis; Asthma, moderate persistent, poorly-controlled; Panic attack; Neutropenia ([place]); Major depression, recurrent, chronic ([place]); GAD (generalized anxiety disorder); Mood disorder ([place]); PTSD (post-traumatic stress disorder); Class 1 obesity due to excess calories without serious comorbidity with body mass index (BMI) of 30.0 to 30.9 in adult; Gestational hypertension, antepartum; Elevated blood pressure affecting pregnancy in third trimester, antepartum; Labor and delivery indication for care or intervention; Allergy to nuts; History of depression; Primary dysmenorrhea; Leukopenia; Chest pain; and Supervision of other normal pregnancy, antepartum on their problem list.  ?----------------------------------------------------------------------------------- ?Patient reports no complaints.  Doing well, nausea starting to improving. Using nicoderm patch, has not smoke a cigarette since putting on patch, now is sensitive to the smoke-lives with her parents  that smoke they too are trying to quit smoking.  ?Mood has been up and down, she is easily irritable, she is able to go to her room and be by herself when this happens.  ?Contractions: Not present. Vag. Bleeding: None.  Movement: Absent. Leaking Fluid denies.  ?----------------------------------------------------------------------------------- ?The following portions of the patient's history were reviewed and updated as appropriate: allergies, current medications, past family history, past medical history, past social history, past surgical history and problem list. Problem list updated. ? ?Objective  ?Blood pressure 120/72, weight 206 lb (93.4 kg), last menstrual period 07/23/2021, unknown if currently breastfeeding. ?Pregravid  weight 208 lb (94.3 kg) Total Weight Gain -2 lb (-0.907 kg) ?Urinalysis: Urine Protein    Urine Glucose   ? ?Fetal Status: Fetal Heart Rate (bpm): 155   Movement: Absent    ? ?General:  Alert, oriented and cooperative. Patient is in no acute distress.  ?Skin: Skin is warm and dry. No rash noted.   ?Cardiovascular: Normal heart rate noted  ?Respiratory: Normal respiratory effort, no problems with respiration noted  ?Abdomen: Soft, gravid, appropriate for gestational age. Pain/Pressure: Absent     ?Pelvic:  Cervical exam deferred        ?Extremities: Normal range of motion.     ?Mental Status: Normal mood and affect. Normal behavior. Normal judgment and thought content.  ? ?Assessment  ? ?21 y.o. [id number] at [redacted]w[redacted]d by  05/16/2022, by Ultrasound presenting for routine prenatal visit ? ?Plan  ? ?pregnancy Problems (from 09/16/21 to present)   ? ? Problem Noted Resolved  ? Supervision of other normal pregnancy, antepartum 10/07/2021 by Allen Derry, CNM No  ? Overview Signed 11/04/2021 10:32 AM by Allen Derry, CNM  ?   ?Nursing Staff Provider  ?Office Location  [facility] Dating  [redacted]w[redacted]d on 2/7  ?Language  English  Anatomy US    ?Flu Vaccine   Genetic Screen  NIPS: neg, XX  ?TDaP vaccine    Hgb A1C or  ?GTT Early : ?Third trimester :   ?Covid    LAB RESULTS   ?Rhogam  NA Blood Type A/Positive/-- (02/24 1114)   ?Feeding Plan  Antibody Negative (02/24 1114)  ?Contraception  Rubella 2.52 (02/24 1114)  ?Circumcision NA RPR Non Reactive (02/24 1114)   ?Pediatrician  Jefm Bryant [place]  HBsAg Negative (02/24 1114)   ?Support Person  HIV Non Reactive (02/24 1114)  ?Prenatal Classes multip  Varicella Immune  ?  GBS  (For PCN allergy, check sensitivities)   ?BTL Consent     ?VBAC  Consent  Pap  ASC-US HPV pos   ?  Hgb Electro    ?Pelvis Tested  CF   ?   SMA   ?     ? ? ?  ?  ? ?  ?  ? ?general obstetric precautions including but not limited to vaginal bleeding, contractions, leaking of fluid and fetal movement were reviewed in  detail with the patient. ?Please refer to After Visit Summary for other counseling recommendations.  ? ?No follow-ups on file. ? ?Anatomy US ordered  ? ?Roberto Scales, CNM  ?Mosetta Pigeon, [place]  ?11/04/21  ?10:36 AM  ? ? ?

## 2021-11-25 ENCOUNTER — Ambulatory Visit (INDEPENDENT_AMBULATORY_CARE_PROVIDER_SITE_OTHER): Payer: Medicaid Other | Admitting: Licensed Practical Nurse

## 2021-11-25 ENCOUNTER — Encounter: Payer: Self-pay | Admitting: Licensed Practical Nurse

## 2021-11-25 VITALS — BP 120/62 | Wt 204.0 lb

## 2021-11-25 DIAGNOSIS — Z3480 Encounter for supervision of other normal pregnancy, unspecified trimester: Secondary | ICD-10-CM

## 2021-11-25 DIAGNOSIS — F17210 Nicotine dependence, cigarettes, uncomplicated: Secondary | ICD-10-CM | POA: Diagnosis not present

## 2021-11-25 DIAGNOSIS — K5900 Constipation, unspecified: Secondary | ICD-10-CM

## 2021-11-25 DIAGNOSIS — Z3A15 15 weeks gestation of pregnancy: Secondary | ICD-10-CM | POA: Diagnosis not present

## 2021-11-25 LAB — POCT URINALYSIS DIPSTICK OB
Glucose, UA: NEGATIVE
POC,PROTEIN,UA: NEGATIVE

## 2021-11-25 MED ORDER — NICOTINE 14 MG/24HR TD PT24: 14.0000 mg | MEDICATED_PATCH | Freq: Every day | TRANSDERMAL | 0 refills | Status: AC

## 2021-11-25 NOTE — Progress Notes (Signed)
Routine Prenatal Care Visit ? ?Subjective  ?Grace [name] is a 21 y.o. [phone] at [redacted]w[redacted]d being seen today for ongoing prenatal care.  She is currently monitored for the following issues for this low-risk pregnancy and has Chronic allergic rhinitis; Asthma, moderate persistent, poorly-controlled; Panic attack; Neutropenia (HCC); Major depression, recurrent, chronic (HCC); GAD (generalized anxiety disorder); Mood disorder (HCC); PTSD (post-traumatic stress disorder); Class 1 obesity due to excess calories without serious comorbidity with body mass index (BMI) of 30.0 to 30.9 in adult; Gestational hypertension, antepartum; Elevated blood pressure affecting pregnancy in third trimester, antepartum; Labor and delivery indication for care or intervention; Allergy to nuts; History of depression; Primary dysmenorrhea; Leukopenia; Chest pain; and Supervision of other normal pregnancy, antepartum on their problem list.  ?----------------------------------------------------------------------------------- ?Patient reports  has had right sided pain x 1 week .  The pain is dull and constant, it was been there daily.  It feels best at rest, the other day she touched her side and thought she felt something hard.  She had losses BM 's today, admits she does struggle with constipation. Has been able to sleep.  Has had nausea throughout the pregnancy.  Denies any fevers.  ?-abd soft, some tenderness in LRQ with palpation, denies pain when right foot tapped. No tenderness in all others areas of the abd.  ?Contractions: Not present. Vag. Bleeding: None.  Movement: Present. Leaking Fluid denies.  ?----------------------------------------------------------------------------------- ?The following portions of the patient's history were reviewed and updated as appropriate: allergies, current medications, past family history, past medical history, past social history, past surgical history and problem list. Problem list updated. ? ?Objective   ?Blood pressure 120/62, weight 204 lb (92.5 kg), last menstrual period 07/23/2021, unknown if currently breastfeeding. ?Pregravid weight 208 lb (94.3 kg) Total Weight Gain -4 lb (-1.814 kg) ?Urinalysis: Urine Protein Negative  Urine Glucose Negative ? ?Fetal Status: Fetal Heart Rate (bpm): 143   Movement: Present    ? ?General:  Alert, oriented and cooperative. Patient is in no acute distress.  ?Skin: Skin is warm and dry. No rash noted.   ?Cardiovascular: Normal heart rate noted  ?Respiratory: Normal respiratory effort, no problems with respiration noted  ?Abdomen: Soft, gravid, appropriate for gestational age. Pain/Pressure: Absent     ?Pelvic:  Cervical exam deferred        ?Extremities: Normal range of motion.     ?Mental Status: Normal mood and affect. Normal behavior. Normal judgment and thought content.  ? ?Assessment  ? ?21 y.o. [id number] at [redacted]w[redacted]d by  05/16/2022, by Ultrasound presenting for work-in prenatal visit ? ?Plan  ? ?pregnancy Problems (from 09/16/21 to present)   ? ? Problem Noted Resolved  ? Supervision of other normal pregnancy, antepartum 10/07/2021 by Ellwood Sayers, CNM No  ? Overview Signed 11/04/2021 10:32 AM by Ellwood Sayers, CNM  ?   ?Nursing Staff Provider  ?Office Location  [facility] Dating  [redacted]w[redacted]d on 2/7  ?Language  English  Anatomy US    ?Flu Vaccine   Genetic Screen  NIPS: neg, XX  ?TDaP vaccine    Hgb A1C or  ?GTT Early : ?Third trimester :   ?Covid    LAB RESULTS   ?Rhogam  NA Blood Type A/Positive/-- (02/24 1114)   ?Feeding Plan  Antibody Negative (02/24 1114)  ?Contraception  Rubella 2.52 (02/24 1114)  ?Circumcision NA RPR Non Reactive (02/24 1114)   ?Pediatrician  Gavin Potters [place]  HBsAg Negative (02/24 1114)   ?Support Person  HIV Non Reactive (02/24 1114)  ?  Prenatal Classes multip  Varicella Immune  ?  GBS  (For PCN allergy, check sensitivities)   ?BTL Consent     ?VBAC Consent  Pap  ASC-US HPV pos   ?  Hgb Electro    ?Pelvis Tested  CF   ?   SMA   ?     ? ? ?  ?  ? ?  ?   ? ?general obstetric precautions including but not limited to vaginal bleeding, contractions, leaking of fluid and fetal movement were reviewed in detail with the patient. ?Please refer to After Visit Summary for other counseling recommendations.  ? ?Return for keep next ROB. ? ?Handout for B6 and unisom given  ? ?Has anatomy US scheduled  ? ?Will do comfort measures for constipation, if pain still present at next visit, consider abd [place].  ? ?Instructed to directly to Ed if pain worsens, wakes you from your sleep or causes you to vomit, and.or fever develops.  ? ?Carie Caddy, CNM  ?Domingo Pulse, [facility]  ?11/25/21  ?4:46 PM  ? ? ?

## 2021-11-26 NOTE — Telephone Encounter (Signed)
Pt seen Grace [name] yesterday.

## 2021-11-27 ENCOUNTER — Encounter: Payer: Self-pay | Admitting: Licensed Practical Nurse

## 2021-12-02 ENCOUNTER — Encounter: Payer: Self-pay | Admitting: Licensed Practical Nurse

## 2021-12-02 ENCOUNTER — Other Ambulatory Visit (HOSPITAL_COMMUNITY)
Admission: RE | Admit: 2021-12-02 | Discharge: 2021-12-02 | Disposition: A | Discharge status: Home / Self Care | Payer: Medicaid Other | Source: Ambulatory Visit | Attending: Licensed Practical Nurse | Admitting: Licensed Practical Nurse

## 2021-12-02 ENCOUNTER — Ambulatory Visit (INDEPENDENT_AMBULATORY_CARE_PROVIDER_SITE_OTHER): Payer: Medicaid Other | Admitting: Licensed Practical Nurse

## 2021-12-02 VITALS — BP 122/62 | Wt 203.0 lb

## 2021-12-02 DIAGNOSIS — N920 Excessive and frequent menstruation with regular cycle: Secondary | ICD-10-CM

## 2021-12-02 DIAGNOSIS — N898 Other specified noninflammatory disorders of vagina: Secondary | ICD-10-CM

## 2021-12-02 DIAGNOSIS — F17210 Nicotine dependence, cigarettes, uncomplicated: Secondary | ICD-10-CM

## 2021-12-02 DIAGNOSIS — Z3480 Encounter for supervision of other normal pregnancy, unspecified trimester: Secondary | ICD-10-CM | POA: Insufficient documentation

## 2021-12-02 DIAGNOSIS — R109 Unspecified abdominal pain: Secondary | ICD-10-CM

## 2021-12-02 DIAGNOSIS — Z3A16 16 weeks gestation of pregnancy: Secondary | ICD-10-CM

## 2021-12-02 LAB — POCT URINALYSIS DIPSTICK OB
Glucose, UA: NEGATIVE
POC,PROTEIN,UA: NEGATIVE

## 2021-12-02 NOTE — Progress Notes (Signed)
Routine Prenatal Care Visit ? ?Subjective  ?Grace [name] is a 21 y.o. [phone] at [redacted]w[redacted]d being seen today for ongoing prenatal care.  She is currently monitored for the following issues for this low-risk pregnancy and has Chronic allergic rhinitis; Asthma, moderate persistent, poorly-controlled; Panic attack; Neutropenia (HCC); Major depression, recurrent, chronic (HCC); GAD (generalized anxiety disorder); Mood disorder (HCC); PTSD (post-traumatic stress disorder); Class 1 obesity due to excess calories without serious comorbidity with body mass index (BMI) of 30.0 to 30.9 in adult; Gestational hypertension, antepartum; Elevated blood pressure affecting pregnancy in third trimester, antepartum; Labor and delivery indication for care or intervention; Allergy to nuts; History of depression; Primary dysmenorrhea; Leukopenia; Chest pain; and Supervision of other normal pregnancy, antepartum on their problem list.  ?----------------------------------------------------------------------------------- ?Patient reports  noted a "snot like discharge" x 1 week, and today there was a spot of blood mixed in with it. Continues to have lower right sided pain, has increased her fruits/veggies water intake and walking and now has more regular BM's, but the pain is still there, it is worse at night. .  She has had nausea since the beginning of pregnancy.  ?Grace [name] stopped smoking but vapes, is no longer using the patch, reports she is stressed (has lots of family drama), vaping helps with stress, is trying to stop MJ but has not fully stopped.  Desires to stop both vaping and MJ.  ?Experienced numbness in her face last week, it only lasted about a minute, when it happened her head spontaneously turned to the side, she was in the passenger side of a car when it happened.  No recent injury or excessive exercise. Today her face is symmetrical not deficits noted, she is able to turn her head both directions.  She is seeing her PCP next  week about this.  ?Contractions: Not present. Vag. Bleeding: None.  Movement: Present. Leaking Fluid denies.  ?----------------------------------------------------------------------------------- ?The following portions of the patient's history were reviewed and updated as appropriate: allergies, current medications, past family history, past medical history, past social history, past surgical history and problem list. Problem list updated. ? ?Objective  ?Blood pressure 122/62, weight 203 lb (92.1 kg), last menstrual period 07/23/2021, unknown if currently breastfeeding. ?Pregravid weight 208 lb (94.3 kg) Total Weight Gain -5 lb (-2.268 kg) ?Urinalysis: Urine Protein Negative  Urine Glucose Negative ? ?Fetal Status: Fetal Heart Rate (bpm): 140   Movement: Present    ? ?General:  Alert, oriented and cooperative. Patient is in no acute distress.  ?Skin: Skin is warm and dry. No rash noted.   ?Cardiovascular: Normal heart rate noted  ?Respiratory: Normal respiratory effort, no problems with respiration noted  ?Abdomen: Soft, gravid, appropriate for gestational age. Pain/Pressure: Present     ?Pelvic:  Cervical exam deferred      spec exam, cervix visually closed, white mucus like discharge present  ?Extremities: Normal range of motion.     ?Mental Status: Normal mood and affect. Normal behavior. Normal judgment and thought content.  ? ?Assessment  ? ?21 y.o. [id number] at [redacted]w[redacted]d by  05/16/2022, by Ultrasound presenting for routine prenatal visit ? ?Plan  ? ?pregnancy Problems (from 09/16/21 to present)   ? ? Problem Noted Resolved  ? Supervision of other normal pregnancy, antepartum 10/07/2021 by Ellwood Sayers, CNM No  ? Overview Signed 11/04/2021 10:32 AM by Ellwood Sayers, CNM  ?   ?Nursing Staff Provider  ?Office Location  [facility] Dating  [redacted]w[redacted]d on 2/7  ?Language  English  Anatomy US    ?  Flu Vaccine   Genetic Screen  NIPS: neg, XX  ?TDaP vaccine    Hgb A1C or  ?GTT Early : ?Third trimester :   ?Covid    LAB  RESULTS   ?Rhogam  NA Blood Type A/Positive/-- (02/24 1114)   ?Feeding Plan  Antibody Negative (02/24 1114)  ?Contraception  Rubella 2.52 (02/24 1114)  ?Circumcision NA RPR Non Reactive (02/24 1114)   ?Pediatrician  Gavin Potters [place]  HBsAg Negative (02/24 1114)   ?Support Person  HIV Non Reactive (02/24 1114)  ?Prenatal Classes multip  Varicella Immune  ?  GBS  (For PCN allergy, check sensitivities)   ?BTL Consent     ?VBAC Consent  Pap  ASC-US HPV pos   ?  Hgb Electro    ?Pelvis Tested  CF   ?   SMA   ?     ? ? ?  ?  ? ?  ?  ? ?general obstetric precautions including but not limited to vaginal bleeding, contractions, leaking of fluid and fetal movement were reviewed in detail with the patient. ?Please refer to After Visit Summary for other counseling recommendations.  ? ?Return in about 4 weeks (around 12/30/2021) for ROB. ? ?Aptima swab sent ? ?Pelvic US ordered  ? ?Anatomy US scheduled 4/26 ? ?Carie Caddy, CNM  ?Domingo Pulse, [facility]  ?12/02/21  ?10:47 AM  ? ? ?

## 2021-12-02 NOTE — Addendum Note (Signed)
Addended byCarie Caddy on: 12/02/2021 12:08 PM ? ? Modules accepted: Orders ? ?

## 2021-12-04 LAB — CERVICOVAGINAL ANCILLARY ONLY
Bacterial Vaginitis (gardnerella): NEGATIVE
Candida Glabrata: NEGATIVE
Candida Vaginitis: POSITIVE — AB
Chlamydia: NEGATIVE
Comment: NEGATIVE
Comment: NEGATIVE
Comment: NEGATIVE
Comment: NEGATIVE
Comment: NEGATIVE
Comment: NORMAL
Neisseria Gonorrhea: NEGATIVE
Trichomonas: NEGATIVE

## 2021-12-05 ENCOUNTER — Encounter: Payer: Self-pay | Admitting: Licensed Practical Nurse

## 2021-12-05 ENCOUNTER — Other Ambulatory Visit: Payer: Self-pay | Admitting: Licensed Practical Nurse

## 2021-12-05 DIAGNOSIS — B3731 Acute candidiasis of vulva and vagina: Secondary | ICD-10-CM

## 2021-12-05 MED ORDER — FLUCONAZOLE 150 MG PO TABS: 150.0000 mg | ORAL_TABLET | ORAL | 0 refills | Status: AC

## 2021-12-05 NOTE — Progress Notes (Signed)
Vaginal yeast on swab, pt has had reaction to monistat in the past. Script sent to pharmacy for Diflucan ?Carie Caddy, CNM  ?Domingo Pulse, [facility]  ?12/05/21  ?4:57 PM  ? ?

## 2021-12-11 ENCOUNTER — Ambulatory Visit: Payer: Medicaid Other

## 2021-12-11 ENCOUNTER — Telehealth: Payer: Self-pay

## 2021-12-11 ENCOUNTER — Other Ambulatory Visit: Payer: Self-pay

## 2021-12-11 ENCOUNTER — Ambulatory Visit
Admission: RE | Admit: 2021-12-11 | Discharge: 2021-12-11 | Disposition: A | Discharge status: Home / Self Care | Payer: Medicaid Other | Source: Ambulatory Visit | Attending: Licensed Practical Nurse | Admitting: Licensed Practical Nurse

## 2021-12-11 DIAGNOSIS — R109 Unspecified abdominal pain: Secondary | ICD-10-CM | POA: Diagnosis present

## 2021-12-11 NOTE — Telephone Encounter (Signed)
Attempted to reach patient to remind her of her exercise tolerance test scheduled for tomorrow April 20th, 2023 at 10:00 AM, and to arrive 15 mins early.  However, patient has a VM that is full and I was unable to leave any message. ?

## 2021-12-12 ENCOUNTER — Ambulatory Visit: Payer: Medicaid Other

## 2021-12-12 NOTE — Telephone Encounter (Signed)
Patient arrived for her scheduled ETT. Test was originally ordered in Dec 2022 (prior to her pregnancy). Patient is currently [redacted] weeks pregnant. ?Patient has no showed and rescheduled the ETT several times. Several attempts to reach the patient by telephone have been unsuccessful. ? ?Discussed with Dr. Azucena Cecil. ?We should cancel today's ETT and schedule the patient an in office visit with Dr. Okey Dupre to reassess symptoms and determine if ETT or other testing would be appropriate. ? ?Patient made aware and is agreeable. She will keep her scheduled 01/10/22 appt with Dr. Okey Dupre. ? ?

## 2021-12-18 ENCOUNTER — Ambulatory Visit
Admission: RE | Admit: 2021-12-18 | Discharge: 2021-12-18 | Disposition: A | Discharge status: Home / Self Care | Payer: Medicaid Other | Source: Ambulatory Visit | Attending: Licensed Practical Nurse | Admitting: Licensed Practical Nurse

## 2021-12-18 ENCOUNTER — Ambulatory Visit: Payer: Medicaid Other

## 2021-12-18 DIAGNOSIS — Z3480 Encounter for supervision of other normal pregnancy, unspecified trimester: Secondary | ICD-10-CM | POA: Insufficient documentation

## 2021-12-18 DIAGNOSIS — Z3689 Encounter for other specified antenatal screening: Secondary | ICD-10-CM | POA: Diagnosis present

## 2021-12-18 DIAGNOSIS — Z3A18 18 weeks gestation of pregnancy: Secondary | ICD-10-CM | POA: Diagnosis not present

## 2021-12-20 ENCOUNTER — Ambulatory Visit: Payer: Medicaid Other | Admitting: Internal Medicine

## 2021-12-30 ENCOUNTER — Encounter: Payer: Medicaid Other | Admitting: Obstetrics

## 2022-01-01 ENCOUNTER — Encounter: Payer: Medicaid Other | Admitting: Obstetrics

## 2022-01-08 ENCOUNTER — Ambulatory Visit (INDEPENDENT_AMBULATORY_CARE_PROVIDER_SITE_OTHER): Payer: Medicaid Other | Admitting: Licensed Practical Nurse

## 2022-01-08 ENCOUNTER — Encounter: Payer: Self-pay | Admitting: Licensed Practical Nurse

## 2022-01-08 VITALS — BP 120/70 | Wt 203.0 lb

## 2022-01-08 DIAGNOSIS — Z3A21 21 weeks gestation of pregnancy: Secondary | ICD-10-CM

## 2022-01-08 DIAGNOSIS — Z3480 Encounter for supervision of other normal pregnancy, unspecified trimester: Secondary | ICD-10-CM

## 2022-01-08 DIAGNOSIS — Z0283 Encounter for blood-alcohol and blood-drug test: Secondary | ICD-10-CM

## 2022-01-08 LAB — POCT URINALYSIS DIPSTICK OB
Glucose, UA: NEGATIVE
POC,PROTEIN,UA: NEGATIVE

## 2022-01-08 NOTE — Progress Notes (Signed)
Routine Prenatal Care Visit ? ?Subjective  ?Grace [name] is a 21 y.o. [phone] at [redacted]w[redacted]d being seen today for ongoing prenatal care.  She is currently monitored for the following issues for this high-risk pregnancy and has Chronic allergic rhinitis; Asthma, moderate persistent, poorly-controlled; Panic attack; Neutropenia ([place]); Major depression, recurrent, chronic ([place]); GAD (generalized anxiety disorder); Mood disorder ([place]); PTSD (post-traumatic stress disorder); Class 1 obesity due to excess calories without serious comorbidity with body mass index (BMI) of 30.0 to 30.9 in adult; Gestational hypertension, antepartum; Elevated blood pressure affecting pregnancy in third trimester, antepartum; Labor and delivery indication for care or intervention; Allergy to nuts; History of depression; Primary dysmenorrhea; Leukopenia; Chest pain; and Supervision of other normal pregnancy, antepartum on their problem list.  ?----------------------------------------------------------------------------------- ?Patient reports Here with partner and son. Doing ok, has occasional nausea that is improving.  She has concerns with her mood-did not elaborate, but has an apt with behavioral health this month.  Plans to start yoga and increase her exercise.  Only eats 1 meal a day, has a decreased appetite, admits her depression could be influencing her appetite. Encouraged her to carry caloric, nutrient dense snack with her so that sh ecan snack frequently when she does have an appetite.  ?Contractions: Not present. Vag. Bleeding: None.  Movement: Present. Leaking Fluid denies.  ?----------------------------------------------------------------------------------- ?The following portions of the patient's history were reviewed and updated as appropriate: allergies, current medications, past family history, past medical history, past social history, past surgical history and problem list. Problem list updated. ? ?Objective  ?Blood pressure  120/70, weight 203 lb (92.1 kg), last menstrual period 07/23/2021, unknown if currently breastfeeding. ?Pregravid weight 208 lb (94.3 kg) Total Weight Gain -5 lb (-2.268 kg) ?Urinalysis: Urine Protein Negative  Urine Glucose Negative ? ?Fetal Status: Fetal Heart Rate (bpm): 143   Movement: Present    ? ?General:  Alert, oriented and cooperative. Patient is in no acute distress.  ?Skin: Skin is warm and dry. No rash noted.   ?Cardiovascular: Normal heart rate noted  ?Respiratory: Normal respiratory effort, no problems with respiration noted  ?Abdomen: Soft, gravid, appropriate for gestational age. Pain/Pressure: Absent     ?Pelvic:  Cervical exam deferred        ?Extremities: Normal range of motion.     ?Mental Status: Normal mood and affect. Normal behavior. Normal judgment and thought content.  ? ?Assessment  ? ?21 y.o. [id number] at [redacted]w[redacted]d by  05/16/2022, by Ultrasound presenting for routine prenatal visit ? ?Plan  ? ?pregnancy Problems (from 09/16/21 to present)   ? ? Problem Noted Resolved  ? Supervision of other normal pregnancy, antepartum 10/07/2021 by Allen Derry, CNM No  ? Overview Signed 11/04/2021 10:32 AM by Allen Derry, CNM  ?   ?Nursing Staff Provider  ?Office Location  [facility] Dating  [redacted]w[redacted]d on 2/7  ?Language  English  Anatomy US    ?Flu Vaccine   Genetic Screen  NIPS: neg, XX  ?TDaP vaccine    Hgb A1C or  ?GTT Early : ?Third trimester :   ?Covid    LAB RESULTS   ?Rhogam  NA Blood Type A/Positive/-- (02/24 1114)   ?Feeding Plan  Antibody Negative (02/24 1114)  ?Contraception  Rubella 2.52 (02/24 1114)  ?Circumcision NA RPR Non Reactive (02/24 1114)   ?Pediatrician  Jefm Bryant [place]  HBsAg Negative (02/24 1114)   ?Support Person  HIV Non Reactive (02/24 1114)  ?Prenatal Classes multip  Varicella Immune  ?  GBS  (For  PCN allergy, check sensitivities)   ?BTL Consent     ?VBAC Consent  Pap  ASC-US HPV pos   ?  Hgb Electro    ?Pelvis Tested  CF   ?   SMA   ?     ? ? ?  ?  ? ?  ?  ? ?Preterm labor  symptoms and general obstetric precautions including but not limited to vaginal bleeding, contractions, leaking of fluid and fetal movement were reviewed in detail with the patient. ?Please refer to After Visit Summary for other counseling recommendations.  ? ?Return in about 4 weeks (around 02/05/2022) for [place]. ? ?Roberto Scales, CNM  ?Mosetta Pigeon, [place]  ?01/08/22  ?10:40 AM  ? ? ?

## 2022-01-10 ENCOUNTER — Ambulatory Visit: Payer: Medicaid Other | Admitting: Internal Medicine

## 2022-01-10 NOTE — Progress Notes (Deleted)
   Follow-up Outpatient Visit Date: 01/10/2022  Primary Care Provider: Lorn Junes, FNP [address] [place] [address]  Chief Complaint: ***  HPI:  Grace [name] is a 21 y.o. female with history of asthma, depression, and PTSD, who presents for follow-up of ***.  --------------------------------------------------------------------------------------------------  ***  Recent CV Pertinent Labs: Lab Results  Component Value Date   CHOL 187 (H) 12/06/2018   HDL 50 12/06/2018   LDLCALC 114 (H) 12/06/2018   TRIG 124 (H) 12/06/2018   CHOLHDL 3.7 12/06/2018   BNP 11.0 08/01/2021   K 3.8 09/15/2021   BUN 9 09/15/2021   CREATININE 0.71 09/15/2021   CREATININE 0.86 12/20/2018    Past medical and surgical history were reviewed and updated in EPIC.  No outpatient medications have been marked as taking for the 01/10/22 encounter (Appointment) with Ted Leonhart, Cristal Deer, MD.    Allergies: Patient has no known allergies.  Social History   Tobacco Use   Smoking status: Former    Packs/day: 0.25    Years: 2.00    Pack years: 0.50    Types: Cigarettes, E-cigarettes    Start date: 03/01/2017    Quit date: 07/11/2020    Years since quitting: 1.5   Smokeless tobacco: Never   Tobacco comments:    patient states she vapes once a day  Vaping Use   Vaping Use: Every day   Start date: 03/06/2017  Substance Use Topics   Alcohol use: Yes    Comment: occassionally   Drug use: Yes    Frequency: 7.0 times per week    Types: Marijuana    Comment: occassionally    Family History  Problem Relation Age of Onset   Hyperlipidemia Mother    Endometriosis Mother    Anxiety disorder Mother    Depression Mother    Post-traumatic stress disorder Mother    Post-traumatic stress disorder Father    Depression Father    Anxiety disorder Father    Hyperlipidemia Father    Heart disease Father    Heart attack Father 76   Heart failure Father    Asthma Father    Hypertension Father     ADD / ADHD Brother     Review of Systems: A 12-system review of systems was performed and was negative except as noted in the HPI.  --------------------------------------------------------------------------------------------------  Physical Exam: LMP 07/23/2021 (Exact Date)   General:  NAD. Neck: No JVD or HJR. Lungs: Clear to auscultation bilaterally without wheezes or crackles. Heart: Regular rate and rhythm without murmurs, rubs, or gallops. Abdomen: Soft, nontender, nondistended. Extremities: No lower extremity edema.  EKG:  ***  Lab Results  Component Value Date   WBC 5.4 10/18/2021   HGB 12.3 10/18/2021   HCT 37.6 10/18/2021   MCV 86 10/18/2021   PLT 316 10/18/2021    Lab Results  Component Value Date   NA 136 09/15/2021   K 3.8 09/15/2021   CL 105 09/15/2021   CO2 26 09/15/2021   BUN 9 09/15/2021   CREATININE 0.71 09/15/2021   GLUCOSE 89 09/15/2021   ALT 12 09/15/2021    Lab Results  Component Value Date   CHOL 187 (H) 12/06/2018   HDL 50 12/06/2018   LDLCALC 114 (H) 12/06/2018   TRIG 124 (H) 12/06/2018   CHOLHDL 3.7 12/06/2018    --------------------------------------------------------------------------------------------------  ASSESSMENT AND PLAN: Cristal Deer Hilde Churchman, MD 01/10/2022 7:07 AM

## 2022-01-13 ENCOUNTER — Encounter: Payer: Self-pay | Admitting: Internal Medicine

## 2022-01-13 ENCOUNTER — Telehealth: Payer: Self-pay

## 2022-01-13 NOTE — Telephone Encounter (Signed)
Carina called triage line asking if we could call in a prescription to her [facility] office for some Ensures since she is still losing weight.

## 2022-01-14 LAB — URINE DRUG PANEL 7
Amphetamines, Urine: NEGATIVE ng/mL
Barbiturate Quant, Ur: NEGATIVE ng/mL
Benzodiazepine Quant, Ur: NEGATIVE ng/mL
Cannabinoid Quant, Ur: POSITIVE — AB
Cocaine (Metab.): NEGATIVE ng/mL
Opiate Quant, Ur: NEGATIVE ng/mL
PCP Quant, Ur: NEGATIVE ng/mL

## 2022-01-17 NOTE — Telephone Encounter (Signed)
Called Grace [name] to let her know we faxed a WIC form in today.

## 2022-01-30 ENCOUNTER — Encounter: Payer: Self-pay | Admitting: Licensed Practical Nurse

## 2022-01-31 ENCOUNTER — Encounter: Payer: Self-pay | Admitting: Licensed Practical Nurse

## 2022-02-04 ENCOUNTER — Other Ambulatory Visit: Payer: Self-pay | Admitting: Family Medicine

## 2022-02-04 ENCOUNTER — Ambulatory Visit (INDEPENDENT_AMBULATORY_CARE_PROVIDER_SITE_OTHER): Payer: Medicaid Other | Admitting: Family Medicine

## 2022-02-04 VITALS — BP 130/80 | Wt 212.0 lb

## 2022-02-04 DIAGNOSIS — Z3480 Encounter for supervision of other normal pregnancy, unspecified trimester: Secondary | ICD-10-CM

## 2022-02-04 DIAGNOSIS — O26899 Other specified pregnancy related conditions, unspecified trimester: Secondary | ICD-10-CM

## 2022-02-04 DIAGNOSIS — Z3A25 25 weeks gestation of pregnancy: Secondary | ICD-10-CM

## 2022-02-04 DIAGNOSIS — Z8759 Personal history of other complications of pregnancy, childbirth and the puerperium: Secondary | ICD-10-CM

## 2022-02-04 DIAGNOSIS — R519 Headache, unspecified: Secondary | ICD-10-CM

## 2022-02-04 DIAGNOSIS — Z3A29 29 weeks gestation of pregnancy: Secondary | ICD-10-CM

## 2022-02-04 LAB — POCT URINALYSIS DIPSTICK OB
Glucose, UA: NEGATIVE
POC,PROTEIN,UA: NEGATIVE

## 2022-02-04 MED ORDER — CYCLOBENZAPRINE HCL 10 MG PO TABS: 10.0000 mg | ORAL_TABLET | Freq: Three times a day (TID) | ORAL | 1 refills | Status: DC | PRN

## 2022-02-04 NOTE — Progress Notes (Signed)
[facility]  PRENATAL VISIT NOTE  Subjective:  Grace [name] is a 21 y.o. [phone] at [redacted]w[redacted]d being seen today for ongoing prenatal care.  She is currently monitored for the following issues for this low-risk pregnancy and has Chronic allergic rhinitis; Major depression, recurrent, chronic (HCC); GAD (generalized anxiety disorder); Mood disorder (HCC); PTSD (post-traumatic stress disorder); Allergy to nuts; Primary dysmenorrhea; and Supervision of other normal pregnancy, antepartum on their problem list.  Patient reports headache- reports HA 2 times per week, does not improve with tylenol but does with rest. Reports no blurry vision. Also has been checking her BP at home and noted 150s/80s. These were when she had dizziness. Denies RUQ pain, scotomata. Reports swelling in legs and hands improves with sleep/overnight.  Contractions: Irritability. Vag. Bleeding: None.  Movement: Present. Denies leaking of fluid/ROM.   The following portions of the patient's history were reviewed and updated as appropriate: allergies, current medications, past family history, past medical history, past social history, past surgical history and problem list. Problem list updated.  Objective:   Vitals:   02/04/22 0842  BP: 130/80  Weight: 212 lb (96.2 kg)    Fetal Status: Fetal Heart Rate (bpm): 135 Fundal Height: 26 cm Movement: Present     General:  Alert, oriented and cooperative. Patient is in no acute distress.  Skin: Skin is warm and dry. No rash noted.   Cardiovascular: Normal heart rate noted  Respiratory: Normal respiratory effort, no problems with respiration noted  Abdomen: Soft, gravid, appropriate for gestational age.  Pain/Pressure: Present     Pelvic: Cervical exam deferred        Extremities: Normal range of motion.  Edema: Trace  Mental Status: Normal mood and affect. Normal behavior. Normal judgment and thought content.   Assessment and Plan:   Pregnancy: G4P1021 at [redacted]w[redacted]d  1. [redacted] weeks gestation of pregnancy - POC Urinalysis Dipstick OB  2. Supervision of other normal pregnancy, antepartum Up to date Referral to [facility] - Comprehensive metabolic panel - CBC - Protein / creatinine ratio, urine - 28 Week RH+Panel; Future  3. History of gestational hypertension Elevated BP at home, WNL today Patient was not started on ASA - Comprehensive metabolic panel - CBC - Protein / creatinine ratio, urine  4. Headache in pregnancy, antepartum - Comprehensive metabolic panel - CBC - Protein / creatinine ratio, urine - cyclobenzaprine (FLEXERIL) 10 MG tablet; Take 1 tablet (10 mg total) by mouth every 8 (eight) hours as needed (headaches).  Dispense: 30 tablet; Refill: 1  5. [redacted] weeks gestation of pregnancy - 28 Week RH+Panel; Future    Preterm labor symptoms and general obstetric precautions including but not limited to vaginal bleeding, contractions, leaking of fluid and fetal movement were reviewed in detail with the patient. Please refer to After Visit Summary for other counseling recommendations.  Return in about 4 weeks (around 03/04/2022) for Routine prenatal care, 28 wk labs (placed as future labs).  Future Appointments  Date Time Provider Department Center  03/04/2022  9:00 AM [facility] None  03/04/2022  9:35 AM Federico Flake, MD [facility] None    Federico Flake, MD

## 2022-02-05 ENCOUNTER — Encounter: Payer: Medicaid Other | Admitting: Licensed Practical Nurse

## 2022-02-05 ENCOUNTER — Encounter: Payer: Medicaid Other | Admitting: Obstetrics and Gynecology

## 2022-02-05 ENCOUNTER — Encounter: Payer: Medicaid Other | Admitting: Obstetrics

## 2022-02-05 LAB — COMPREHENSIVE METABOLIC PANEL
ALT: 7 IU/L (ref 0–32)
AST: 10 IU/L (ref 0–40)
Albumin/Globulin Ratio: 1.4 (ref 1.2–2.2)
Albumin: 3.7 g/dL — ABNORMAL LOW (ref 3.9–5.0)
Alkaline Phosphatase: 100 IU/L (ref 44–121)
BUN/Creatinine Ratio: 4 — ABNORMAL LOW (ref 9–23)
BUN: 2 mg/dL — ABNORMAL LOW (ref 6–20)
Bilirubin Total: <0.2 mg/dL (ref 0.0–1.2)
CO2: 21 mmol/L (ref 20–29)
Calcium: 9 mg/dL (ref 8.7–10.2)
Chloride: 103 mmol/L (ref 96–106)
Creatinine, Ser: 0.55 mg/dL — ABNORMAL LOW (ref 0.57–1.00)
Globulin, Total: 2.7 g/dL (ref 1.5–4.5)
Glucose: 84 mg/dL (ref 70–99)
Potassium: 4.2 mmol/L (ref 3.5–5.2)
Sodium: 135 mmol/L (ref 134–144)
Total Protein: 6.4 g/dL (ref 6.0–8.5)
eGFR: 133 mL/min/{1.73_m2} (ref >59)

## 2022-02-05 LAB — CBC
Hematocrit: 35.4 % (ref 34.0–46.6)
Hemoglobin: 11.8 g/dL (ref 11.1–15.9)
MCH: 29.4 pg (ref 26.6–33.0)
MCHC: 33.3 g/dL (ref 31.5–35.7)
MCV: 88 fL (ref 79–97)
Platelets: 297 10*3/uL (ref 150–450)
RBC: 4.01 x10E6/uL (ref 3.77–5.28)
RDW: 13.5 % (ref 11.7–15.4)
WBC: 6.8 10*3/uL (ref 3.4–10.8)

## 2022-02-05 LAB — PROTEIN / CREATININE RATIO, URINE
Creatinine, Urine: 31 mg/dL
Protein, Ur: 11.5 mg/dL
Protein/Creat Ratio: 371 mg/g creat — ABNORMAL HIGH (ref 0–200)

## 2022-02-06 ENCOUNTER — Encounter: Payer: Self-pay | Admitting: Family Medicine

## 2022-02-08 ENCOUNTER — Encounter: Payer: Self-pay | Admitting: Family Medicine

## 2022-02-08 DIAGNOSIS — O1213 Gestational proteinuria, third trimester: Secondary | ICD-10-CM

## 2022-02-08 HISTORY — DX: Gestational proteinuria, third trimester: O12.13

## 2022-02-11 ENCOUNTER — Inpatient Hospital Stay (HOSPITAL_BASED_OUTPATIENT_CLINIC_OR_DEPARTMENT_OTHER): Payer: Medicaid Other

## 2022-02-11 ENCOUNTER — Encounter (HOSPITAL_COMMUNITY): Payer: Self-pay | Admitting: Obstetrics and Gynecology

## 2022-02-11 ENCOUNTER — Inpatient Hospital Stay (HOSPITAL_COMMUNITY)
Admission: AD | Admit: 2022-02-11 | Discharge: 2022-02-11 | Disposition: A | Discharge status: Home / Self Care | Payer: Medicaid Other | Attending: Obstetrics and Gynecology | Admitting: Obstetrics and Gynecology

## 2022-02-11 DIAGNOSIS — O42913 Preterm premature rupture of membranes, unspecified as to length of time between rupture and onset of labor, third trimester: Secondary | ICD-10-CM | POA: Diagnosis not present

## 2022-02-11 DIAGNOSIS — J45909 Unspecified asthma, uncomplicated: Secondary | ICD-10-CM | POA: Insufficient documentation

## 2022-02-11 DIAGNOSIS — Z0371 Encounter for suspected problem with amniotic cavity and membrane ruled out: Secondary | ICD-10-CM

## 2022-02-11 DIAGNOSIS — R051 Acute cough: Secondary | ICD-10-CM | POA: Insufficient documentation

## 2022-02-11 DIAGNOSIS — F4310 Post-traumatic stress disorder, unspecified: Secondary | ICD-10-CM

## 2022-02-11 DIAGNOSIS — Z3A26 26 weeks gestation of pregnancy: Secondary | ICD-10-CM | POA: Insufficient documentation

## 2022-02-11 DIAGNOSIS — O26892 Other specified pregnancy related conditions, second trimester: Secondary | ICD-10-CM | POA: Insufficient documentation

## 2022-02-11 DIAGNOSIS — R519 Headache, unspecified: Secondary | ICD-10-CM | POA: Insufficient documentation

## 2022-02-11 DIAGNOSIS — O212 Late vomiting of pregnancy: Secondary | ICD-10-CM | POA: Diagnosis present

## 2022-02-11 DIAGNOSIS — R1011 Right upper quadrant pain: Secondary | ICD-10-CM | POA: Diagnosis not present

## 2022-02-11 DIAGNOSIS — Z91018 Allergy to other foods: Secondary | ICD-10-CM

## 2022-02-11 DIAGNOSIS — J309 Allergic rhinitis, unspecified: Secondary | ICD-10-CM

## 2022-02-11 DIAGNOSIS — F339 Major depressive disorder, recurrent, unspecified: Secondary | ICD-10-CM

## 2022-02-11 DIAGNOSIS — Z3480 Encounter for supervision of other normal pregnancy, unspecified trimester: Secondary | ICD-10-CM

## 2022-02-11 DIAGNOSIS — R030 Elevated blood-pressure reading, without diagnosis of hypertension: Secondary | ICD-10-CM | POA: Insufficient documentation

## 2022-02-11 DIAGNOSIS — F39 Unspecified mood [affective] disorder: Secondary | ICD-10-CM

## 2022-02-11 DIAGNOSIS — O99512 Diseases of the respiratory system complicating pregnancy, second trimester: Secondary | ICD-10-CM | POA: Insufficient documentation

## 2022-02-11 DIAGNOSIS — N944 Primary dysmenorrhea: Secondary | ICD-10-CM

## 2022-02-11 DIAGNOSIS — O1213 Gestational proteinuria, third trimester: Secondary | ICD-10-CM

## 2022-02-11 DIAGNOSIS — F411 Generalized anxiety disorder: Secondary | ICD-10-CM

## 2022-02-11 LAB — PROTEIN / CREATININE RATIO, URINE
Creatinine, Urine: 15.23 mg/dL
Total Protein, Urine: <6 mg/dL

## 2022-02-11 LAB — WET PREP, GENITAL
Clue Cells Wet Prep HPF POC: NONE SEEN
Sperm: NONE SEEN
Trich, Wet Prep: NONE SEEN
WBC, Wet Prep HPF POC: <10 (ref <10)
Yeast Wet Prep HPF POC: NONE SEEN

## 2022-02-11 LAB — COMPREHENSIVE METABOLIC PANEL
ALT: 8 U/L (ref 0–44)
AST: 14 U/L — ABNORMAL LOW (ref 15–41)
Albumin: 3 g/dL — ABNORMAL LOW (ref 3.5–5.0)
Alkaline Phosphatase: 98 U/L (ref 38–126)
Anion gap: 9 (ref 5–15)
BUN: <5 mg/dL — ABNORMAL LOW (ref 6–20)
CO2: 23 mmol/L (ref 22–32)
Calcium: 9.1 mg/dL (ref 8.9–10.3)
Chloride: 106 mmol/L (ref 98–111)
Creatinine, Ser: 0.55 mg/dL (ref 0.44–1.00)
GFR, Estimated: >60 mL/min (ref >60)
Glucose, Bld: 89 mg/dL (ref 70–99)
Potassium: 3.7 mmol/L (ref 3.5–5.1)
Sodium: 138 mmol/L (ref 135–145)
Total Bilirubin: 0.6 mg/dL (ref 0.3–1.2)
Total Protein: 6.3 g/dL — ABNORMAL LOW (ref 6.5–8.1)

## 2022-02-11 LAB — AMNISURE RUPTURE OF MEMBRANE (ROM) NOT AT ARMC
Amnisure ROM: NEGATIVE
Amnisure ROM: NEGATIVE

## 2022-02-11 LAB — CBC
HCT: 33.8 % — ABNORMAL LOW (ref 36.0–46.0)
Hemoglobin: 11.7 g/dL — ABNORMAL LOW (ref 12.0–15.0)
MCH: 30.3 pg (ref 26.0–34.0)
MCHC: 34.6 g/dL (ref 30.0–36.0)
MCV: 87.6 fL (ref 80.0–100.0)
Platelets: 283 10*3/uL (ref 150–400)
RBC: 3.86 MIL/uL — ABNORMAL LOW (ref 3.87–5.11)
RDW: 13.9 % (ref 11.5–15.5)
WBC: 7.2 10*3/uL (ref 4.0–10.5)
nRBC: 0 % (ref 0.0–0.2)

## 2022-02-11 LAB — POCT FERN TEST: POCT Fern Test: POSITIVE

## 2022-02-11 MED ORDER — LORATADINE 10 MG PO TABS
10.0000 mg | ORAL_TABLET | Freq: Every day | ORAL | Status: DC
  Administered 2022-02-11: 10 mg via ORAL
  Filled 2022-02-11: qty 1

## 2022-02-11 MED ORDER — LORATADINE 10 MG PO TABS: 10.0000 mg | ORAL_TABLET | Freq: Every day | ORAL | 1 refills | Status: DC

## 2022-02-11 NOTE — MAU Provider Note (Cosign Needed Addendum)
History     CSN: [id number]  Arrival date and time: 02/11/22 1104   Event Date/Time   First Provider Initiated Contact with Patient 02/11/22 1129      Chief Complaint  Patient presents with   Rupture of Membranes   HPI Patient is a 21 year old G4 P1-0-2-1 at [redacted]w[redacted]d who presents for nausea vomiting, leakage of fluid, and headaches.  Nausea and vomiting: Patient states that her nausea and vomiting has been consistent throughout her pregnancy.  Patient states that this is similar to her last pregnancy as well.  States that she has nausea and vomiting 1-2 times per day and does not consistently take anything for.  Leakage of fluid: Patient states that approximately 6 PM on 6/18, she developed a sudden gush of clear fluid.  Initially she thought that she had been resolved and changed and then it happened again.  Patient states that it is happened multiple times and happens whenever she is coughing.  Patient has seasonal allergies as well as asthma which has exacerbated her nonproductive cough.  She states that this happened multiple times throughout the day and since she is unable to smell she had her partner smell the fluid of food stated that he did not have any odor.  Patient denies any fevers, chills, night sweats, chest pain, shortness of breath, bloody discharge, dysuria, urgency, frequency.  Patient has tried to fluid restrict and this has not helped either  Headaches Patient states that she does not typically have headaches although it had an intractable headache last week.  She was seen and evaluated and had labs done at that time that showed proteinuria.  Patient was supposed on her blood pressure at that time.  Since this time, the patient states that she still having intermittent headaches without blurred vision.  He does endorse right upper quadrant pain although states that it is much worse when she is vomiting.  Denies any bloody vaginal discharge or significant leg swelling  recently  OB History     Gravida  4   Para  1   Term  1   Preterm  0   AB  2   Living  1      SAB  2   IAB  0   Ectopic  0   Multiple  0   Live Births  1           Past Medical History:  Diagnosis Date   Acne    Acute nonintractable headache 06/12/2020   Allergy    ALLERGIC RHINITIS   Anxiety    Asthma    Deliberate self-cutting    Depression    Fibrocystic disease of breast    Head pain    History of depression 12/20/2015   History of self-harm    Knee pain, right    Primary dysmenorrhea    Severe myopia of both eyes     Past Surgical History:  Procedure Laterality Date   LAPAROSCOPY  05/07/2017   Procedure: LAPAROSCOPY DIAGNOSTIC;  Surgeon: Schermerhorn, Ihor Austin, MD;  Location: [facility] ORS;  Service: Gynecology;;    Family History  Problem Relation Age of Onset   Hyperlipidemia Mother    Endometriosis Mother    Anxiety disorder Mother    Depression Mother    Post-traumatic stress disorder Mother    Post-traumatic stress disorder Father    Depression Father    Anxiety disorder Father    Hyperlipidemia Father    Heart disease Father  Heart attack Father 40   Heart failure Father    Asthma Father    Hypertension Father    ADD / ADHD Brother     Social History   Tobacco Use   Smoking status: Former    Packs/day: 0.25    Years: 2.00    Total pack years: 0.50    Types: Cigarettes, E-cigarettes    Start date: 03/01/2017    Quit date: 07/11/2020    Years since quitting: 1.5   Smokeless tobacco: Never   Tobacco comments:    patient states she vapes once a day  Vaping Use   Vaping Use: Every day   Start date: 03/06/2017  Substance Use Topics   Alcohol use: Yes    Comment: occassionally   Drug use: Yes    Frequency: 7.0 times per week    Types: Marijuana    Comment: last used 02/10/22    Allergies: No Known Allergies  No medications prior to admission.    Review of Systems  Constitutional: Negative.   HENT: Negative.     Eyes: Negative.   Respiratory:  Positive for cough (has asthma and staying in a house with a lot of mold; which is worsening asthma and cough. Have not been taking allergy medications).   Cardiovascular: Negative.   Gastrointestinal:  Positive for nausea and vomiting.  Endocrine: Negative.   Genitourinary:  Positive for vaginal discharge (gushed out fluid 2 days ago and have continued to trickle out fluid all day).  Musculoskeletal: Negative.   Skin: Negative.   Allergic/Immunologic: Negative.   Neurological: Negative.   Hematological: Negative.   Psychiatric/Behavioral: Negative.     Physical Exam   Blood pressure 124/67, pulse 85, temperature 97.6 F (36.4 C), temperature source Oral, resp. rate 18, height 5\' 6"  (1.676 m), weight 96.2 kg, last menstrual period 07/23/2021, unknown if currently breastfeeding.  Physical Exam Vitals reviewed. Exam conducted with a chaperone present.  Constitutional:      General: She is not in acute distress.    Appearance: She is not ill-appearing or toxic-appearing.  HENT:     Head: Normocephalic and atraumatic.     Nose: Congestion present.  Eyes:     General: No scleral icterus.    Extraocular Movements: Extraocular movements intact.     Conjunctiva/sclera: Conjunctivae normal.  Cardiovascular:     Rate and Rhythm: Normal rate and regular rhythm.     Heart sounds: Normal heart sounds.  Pulmonary:     Effort: Pulmonary effort is normal. No respiratory distress.     Breath sounds: Normal breath sounds. No wheezing, rhonchi or rales.  Abdominal:     General: Bowel sounds are normal.     Palpations: Abdomen is soft.  Genitourinary:    General: Normal vulva.     Vagina: Normal. No erythema or bleeding.     Cervix: Normal.  Musculoskeletal:        General: Normal range of motion.     Cervical back: Normal range of motion.     Right lower leg: No edema.     Left lower leg: No edema.  Skin:    General: Skin is warm and dry.      Coloration: Skin is not jaundiced.  Neurological:     General: No focal deficit present.     Mental Status: She is oriented to person, place, and time. Mental status is at baseline.  Psychiatric:        Mood and Affect: Mood normal.  Behavior: Behavior normal.    MAU Course  Procedures  MDM Patient is a 21 year old G4 P1-0-2-1 at [redacted]w[redacted]d who presents for nausea vomiting, leakage of fluid, and HTN with HA. As patient has multiple concerns, will address them individually as below.   #Nausea and vomiting Patient states that there has been no increase in her symptoms since starting her pregnancy  #Leakage of fluid Concern for rupture of membranes.  Speculum exam performed and patient had significant amounts of fluid in the posterior vaginal vault as well as saturated pad.  Ferning and Amnisure performed. -Ferning was overtly positive although Amnisure was negative. Discussed with Dr. Para March. Will pursue OB U/S for AFI and consider repeat lab work when she returns. -AFI normal, Amnisure negative   #Headache with elevated blood pressure BP 136/80 in triage. Patient describes intermittent headaches that respond well to acetaminophen.  Unfortunate she also has significant amounts of protein in her urine with unknown baseline.  Recently had CBC, CMP, protein/creatinine ratio performed in clinic.  We will repeat labs today. -Repeat BP WNL. CBC without anemia or thrombocytopenia. CMP without renal or hepatic dysfunction. PCR within normal limits. Repeat BP WNL  Assessment and Plan  No leakage of amniotic fluid into vagina - Reassurance given that after 2 Negative Amnisure and fern slides that her water is not broken. Water leaking may be a result of coughing.  [redacted] weeks gestation of pregnancy  Chronic allergic rhinitis  - Rx for Claritin 10 mg daily  Patient presented with leakage of fluid. Ferning X 1 was positive although repeat ferning X 3 negative. Amnisure X 2 negative. OB U/S showed  normal AFI. Therefore, it was concluded that patient's membranes are still intact. Discussed results with the patient. All questions answered.   Gilles Chiquito, MD Family Medicine Resident PGY-2 02/11/2022, 11:38 AM   Attestation of Supervision of Student:  I confirm that I have verified the information documented in the  resident 's note and that I have also personally reperformed the history, physical exam and all medical decision making activities.  I have verified that all services and findings are accurately documented in this student's note; and I agree with management and plan as outlined in the documentation. I have also made any necessary editorial changes.  REACTIVE NST - FHR: 135 bpm / moderate variability / accels present / decels absent / TOCO: UI noted   Raelyn Mora, [facility] [employer], [facility] 02/11/2022 7:20 PM

## 2022-02-11 NOTE — MAU Note (Signed)
.  Grace [name] is a 21 y.o. at [redacted]w[redacted]d here in MAU via EMS arrival.  Patient reports coughing with vomiting that started last night and this AM.  When coughing occurs gushing-vaginal leaking with no smell. Patient has a history of GHTN with previous pregnancy but no concerns at this time.    Vitals:   02/11/22 1110  BP: 136/80  Pulse: 89  Resp: 20  Temp: 97.6 F (36.4 C)     FHT:135 Lab orders placed from triage:  UA

## 2022-02-12 LAB — GC/CHLAMYDIA PROBE AMP (~~LOC~~) NOT AT ARMC
Chlamydia: NEGATIVE
Comment: NEGATIVE
Comment: NORMAL
Neisseria Gonorrhea: NEGATIVE

## 2022-02-16 ENCOUNTER — Telehealth: Payer: Medicaid Other | Admitting: Family

## 2022-02-16 DIAGNOSIS — H109 Unspecified conjunctivitis: Secondary | ICD-10-CM

## 2022-02-16 MED ORDER — POLYMYXIN B-TRIMETHOPRIM 10000-0.1 UNIT/ML-% OP SOLN: 1.0000 [drp] | Freq: Four times a day (QID) | OPHTHALMIC | 0 refills | Status: DC

## 2022-02-18 ENCOUNTER — Ambulatory Visit (INDEPENDENT_AMBULATORY_CARE_PROVIDER_SITE_OTHER): Payer: Medicaid Other | Admitting: Licensed Practical Nurse

## 2022-02-18 VITALS — BP 128/86 | Wt 201.0 lb

## 2022-02-18 DIAGNOSIS — Z0130 Encounter for examination of blood pressure without abnormal findings: Secondary | ICD-10-CM | POA: Diagnosis not present

## 2022-02-18 DIAGNOSIS — Z3480 Encounter for supervision of other normal pregnancy, unspecified trimester: Secondary | ICD-10-CM

## 2022-02-18 DIAGNOSIS — Z3A27 27 weeks gestation of pregnancy: Secondary | ICD-10-CM | POA: Diagnosis not present

## 2022-02-18 LAB — POCT URINALYSIS DIPSTICK
Glucose, UA: NEGATIVE
Protein, UA: NEGATIVE

## 2022-02-22 ENCOUNTER — Other Ambulatory Visit: Payer: Self-pay

## 2022-02-22 ENCOUNTER — Encounter: Payer: Self-pay | Admitting: Obstetrics and Gynecology

## 2022-02-22 ENCOUNTER — Observation Stay
Admission: EM | Admit: 2022-02-22 | Discharge: 2022-02-22 | Disposition: A | Discharge status: Home / Self Care | Payer: Medicaid Other | Attending: Certified Nurse Midwife | Admitting: Certified Nurse Midwife

## 2022-02-22 DIAGNOSIS — O212 Late vomiting of pregnancy: Secondary | ICD-10-CM | POA: Diagnosis not present

## 2022-02-22 DIAGNOSIS — O1213 Gestational proteinuria, third trimester: Secondary | ICD-10-CM

## 2022-02-22 DIAGNOSIS — Z3480 Encounter for supervision of other normal pregnancy, unspecified trimester: Secondary | ICD-10-CM

## 2022-02-22 DIAGNOSIS — Z3A28 28 weeks gestation of pregnancy: Secondary | ICD-10-CM | POA: Diagnosis not present

## 2022-02-22 DIAGNOSIS — Z79899 Other long term (current) drug therapy: Secondary | ICD-10-CM | POA: Diagnosis not present

## 2022-02-22 DIAGNOSIS — O219 Vomiting of pregnancy, unspecified: Secondary | ICD-10-CM

## 2022-02-22 LAB — URINALYSIS, ROUTINE W REFLEX MICROSCOPIC
Bilirubin Urine: NEGATIVE
Glucose, UA: NEGATIVE mg/dL
Hgb urine dipstick: NEGATIVE
Ketones, ur: NEGATIVE mg/dL
Leukocytes,Ua: NEGATIVE
Nitrite: NEGATIVE
Protein, ur: NEGATIVE mg/dL
Specific Gravity, Urine: 1.004 — ABNORMAL LOW (ref 1.005–1.030)
pH: 7 (ref 5.0–8.0)

## 2022-02-22 MED ORDER — ONDANSETRON 4 MG PO TBDP: 4.0000 mg | ORAL_TABLET | Freq: Three times a day (TID) | ORAL | 0 refills | Status: DC | PRN

## 2022-02-22 MED ORDER — ONDANSETRON HCL 4 MG/2ML IJ SOLN
8.0000 mg | Freq: Three times a day (TID) | INTRAMUSCULAR | Status: DC | PRN
  Administered 2022-02-22: 8 mg via INTRAVENOUS
  Filled 2022-02-22: qty 4

## 2022-02-22 MED ORDER — LACTATED RINGERS IV SOLN: INTRAVENOUS | Status: DC

## 2022-02-22 MED ORDER — ACETAMINOPHEN 325 MG PO TABS: 650.0000 mg | ORAL_TABLET | ORAL | Status: DC | PRN

## 2022-02-22 MED ORDER — LACTATED RINGERS IV SOLN
500.0000 mL | INTRAVENOUS | Status: DC | PRN
  Administered 2022-02-22: 1000 mL via INTRAVENOUS

## 2022-02-22 MED ORDER — LIDOCAINE HCL (PF) 1 % IJ SOLN: 30.0000 mL | INTRAMUSCULAR | Status: DC | PRN

## 2022-02-22 NOTE — OB Triage Note (Signed)
Pt states she has had nausea and vomiting "this whole pregnancy." C/O worsening the past few days. Denies diarrhea. She complains of general not feeling well. + FM audible and visual. Kimberlyn Quiocho, [place]ene

## 2022-02-22 NOTE — Progress Notes (Signed)
L&D OB Triage Note  SUBJECTIVE Grace [name] is a 21 y.o. [id number] female at [redacted]w[redacted]d, EDD Estimated Date of Delivery: 05/16/22 who presented to triage with complaints of nausea and vomiting.. PT states she has had the whole pregnancy but has been worse the past few days. State she vomited several times today with the last time at noon. She state she does not have nausea medication at home to take. She denies diarrhea and fever. She feels good fetal movement, denies contractions, loss of fluid and vaginal bleeding.    OB History  Gravida Para Term Preterm AB Living  $Remov'4 1 1 'cGhKAu$ 0 2 1  SAB IAB Ectopic Multiple Live Births  2 0 0 0 1    # Outcome Date GA Lbr Len/2nd Weight Sex Delivery Anes PTL Lv  4 Current           3 Term 07/26/20 [redacted]w[redacted]d / 00:48 2750 g M Vag-Spont EPI  LIV     Name: [name],[name]     Apgar1: 8  Apgar5: 9  2 SAB           1 SAB             Medications Prior to Admission  Medication Sig Dispense Refill Last Dose   acetaminophen (TYLENOL) 325 MG tablet Take 2 tablets (650 mg total) by mouth every 4 (four) hours as needed (for pain scale < 4).   02/21/2022   albuterol (VENTOLIN HFA) 108 (90 Base) MCG/ACT inhaler Inhale 2 puffs into the lungs every 6 (six) hours as needed for wheezing or shortness of breath. 8 g 0 02/21/2022   Blood Pressure KIT 1 kit by Does not apply route daily. Notify provider for blood pressures less than 90/50 or 160/110 or greater 1 kit 0 02/21/2022   cetirizine (ZYRTEC) 10 MG tablet Take 1 tablet (10 mg total) by mouth daily. 90 tablet 1 02/21/2022   loratadine (CLARITIN) 10 MG tablet Take 1 tablet (10 mg total) by mouth daily. 30 tablet 1 Past Week   montelukast (SINGULAIR) 10 MG tablet TAKE ONE TABLET BY MOUTH EVERYDAY AT BEDTIME 90 tablet 1 Past Week   pantoprazole (PROTONIX) 40 MG tablet Take 1 tablet (40 mg total) by mouth daily. 30 tablet 1 02/21/2022   trimethoprim-polymyxin b (POLYTRIM) ophthalmic solution Place 1 drop into the left eye every 6  (six) hours. 10 mL 0 02/21/2022   cyclobenzaprine (FLEXERIL) 10 MG tablet Take 1 tablet (10 mg total) by mouth every 8 (eight) hours as needed (headaches). (Patient not taking: Reported on 02/22/2022) 30 tablet 1 Not Taking   fluticasone (FLONASE) 50 MCG/ACT nasal spray Place 2 sprays into both nostrils daily. (Patient not taking: Reported on 02/22/2022) 16 g 6 Not Taking     OBJECTIVE  Nursing Evaluation:   Ht $R'5\' 6"'jA$  (1.676 m)   Wt 91.2 kg   LMP 07/23/2021 (Exact Date)   BMI 32.44 kg/m    Findings:   N&V pregnancy       NST was performed and has been reviewed by me.  NST INTERPRETATION: Category I  Mode: External Baseline Rate (A): 145 bpm Variability: Moderate Accelerations: 15 x 15 Decelerations: None     Contraction Frequency (min): none  ASSESSMENT Impression:  1.  Pregnancy:  [id number] at [redacted]w[redacted]d , EDD Estimated Date of Delivery: 05/16/22 2.  Reassuring fetal and maternal status 3.  N&V pregnancy, IV hydration and IV Zofran given. After several hours of observation. Patient state she feel  better and request to go home. She declines trial of PO liquids prior to discharge.   PLAN 1. Current condition and above findings reviewed.  Reassuring fetal and maternal condition. 2. Discharge home with standard labor precautions given to return to L&D or call the office for problems. Orders placed for Zofran.  3. Continue routine prenatal care  as scheduled in office or as needed.    Philip Aspen, CNM

## 2022-02-22 NOTE — OB Triage Note (Signed)
Discharge home. Declined food or drink. Left floor ambulatory. Grace [name]

## 2022-03-04 ENCOUNTER — Ambulatory Visit (INDEPENDENT_AMBULATORY_CARE_PROVIDER_SITE_OTHER): Payer: Medicaid Other | Admitting: Family Medicine

## 2022-03-04 ENCOUNTER — Other Ambulatory Visit: Payer: Medicaid Other

## 2022-03-04 ENCOUNTER — Other Ambulatory Visit (HOSPITAL_COMMUNITY)
Admission: RE | Admit: 2022-03-04 | Discharge: 2022-03-04 | Disposition: A | Discharge status: Home / Self Care | Payer: Medicaid Other | Source: Ambulatory Visit | Attending: Family Medicine | Admitting: Family Medicine

## 2022-03-04 VITALS — BP 126/74 | Wt 197.6 lb

## 2022-03-04 DIAGNOSIS — N898 Other specified noninflammatory disorders of vagina: Secondary | ICD-10-CM | POA: Insufficient documentation

## 2022-03-04 DIAGNOSIS — Z3A29 29 weeks gestation of pregnancy: Secondary | ICD-10-CM

## 2022-03-04 DIAGNOSIS — Z3480 Encounter for supervision of other normal pregnancy, unspecified trimester: Secondary | ICD-10-CM

## 2022-03-04 DIAGNOSIS — O1213 Gestational proteinuria, third trimester: Secondary | ICD-10-CM

## 2022-03-04 DIAGNOSIS — O26893 Other specified pregnancy related conditions, third trimester: Secondary | ICD-10-CM | POA: Insufficient documentation

## 2022-03-04 DIAGNOSIS — F39 Unspecified mood [affective] disorder: Secondary | ICD-10-CM

## 2022-03-04 NOTE — Progress Notes (Signed)
    PRENATAL VISIT NOTE  Subjective:  Grace [name] is a 21 y.o. [phone] at [redacted]w[redacted]d being seen today for ongoing prenatal care.  She is currently monitored for the following issues for this low-risk pregnancy and has Chronic allergic rhinitis; Major depression, recurrent, chronic (HCC); GAD (generalized anxiety disorder); Mood disorder (HCC); PTSD (post-traumatic stress disorder); Allergy to nuts; Primary dysmenorrhea; Supervision of other normal pregnancy, antepartum; and Proteinuria affecting pregnancy in third trimester on their problem list.  Patient reports  pain for the past 2 weeks, reports pressure in lower abdomen with cramping and contractions. She is just "hurting all the time, everyday". She reports occasional fluid/discharge that is intermittent. Never soaks were underwear but does make her underwear wet, also for past 2 weeks .  Contractions: Irritability. Vag. Bleeding: None.  Movement: Present. Denies leaking of fluid.   The following portions of the patient's history were reviewed and updated as appropriate: allergies, current medications, past family history, past medical history, past social history, past surgical history and problem list.   Objective:   Vitals:   03/04/22 0905  BP: 126/74  Weight: 197 lb 9.6 oz (89.6 kg)    Fetal Status: Fetal Heart Rate (bpm): 135 Fundal Height: 30 cm Movement: Present     General:  Alert, oriented and cooperative. Patient is in no acute distress.  Skin: Skin is warm and dry. No rash noted.   Cardiovascular: Normal heart rate noted  Respiratory: Normal respiratory effort, no problems with respiration noted  Abdomen: Soft, gravid, appropriate for gestational age.  Pain/Pressure: Absent     Pelvic: Cervical exam performed in the presence of a chaperone Dilation: Closed Effacement (%): Thick Station: Ballotable. Sterile speculum exam showed closed cervix, white discharge, no pooling.   Extremities: Normal range of motion.  Edema: None   Mental Status: Normal mood and affect. Normal behavior. Normal judgment and thought content.   Assessment and Plan:  Pregnancy: G4P1021 at [redacted]w[redacted]d 1. [redacted] weeks gestation of pregnancy - POC Urinalysis Dipstick OB  2. Supervision of other normal pregnancy, antepartum UTD Patient is here with 21 yo.   3. Mood disorder (HCC) Poor eye contact, was actively parenting her 21 yo  4. Proteinuria affecting pregnancy in third trimester Last was 371mg   5. Vaginal discharge during pregnancy in third trimester Given pressure/pain and complaint there was a concern about PTL - Sterile spec exam showed white discharge, no pooling and FERN neg on my slide - likely BV but will r/o other vaginal infections - Cervical exam-- internal os is closed I do not think think patient is in PTL currently. Counseled to go to the hospital if symptoms worsen.     Preterm labor symptoms and general obstetric precautions including but not limited to vaginal bleeding, contractions, leaking of fluid and fetal movement were reviewed in detail with the patient. Please refer to After Visit Summary for other counseling recommendations.   Return in about 2 weeks (around 03/18/2022) for Routine prenatal care.  Future Appointments  Date Time Provider Department Center  03/18/2022  9:35 AM 03/20/2022, MD [facility] None  04/01/2022  9:35 AM 06/01/2022, CNM [facility] None  04/15/2022  9:35 AM 04/17/2022, CNM [facility] None    Tresea Mall, MD

## 2022-03-05 LAB — 28 WEEK RH+PANEL
Basophils Absolute: 0 10*3/uL (ref 0.0–0.2)
Basos: 1 %
EOS (ABSOLUTE): 0 10*3/uL (ref 0.0–0.4)
Eos: 1 %
Gestational Diabetes Screen: 69 mg/dL — ABNORMAL LOW (ref 70–139)
HIV Screen 4th Generation wRfx: NONREACTIVE
Hematocrit: 34.1 % (ref 34.0–46.6)
Hemoglobin: 11.8 g/dL (ref 11.1–15.9)
Immature Grans (Abs): 0 10*3/uL (ref 0.0–0.1)
Immature Granulocytes: 0 %
Lymphocytes Absolute: 1.8 10*3/uL (ref 0.7–3.1)
Lymphs: 39 %
MCH: 29.9 pg (ref 26.6–33.0)
MCHC: 34.6 g/dL (ref 31.5–35.7)
MCV: 87 fL (ref 79–97)
Monocytes Absolute: 0.4 10*3/uL (ref 0.1–0.9)
Monocytes: 8 %
Neutrophils Absolute: 2.3 10*3/uL (ref 1.4–7.0)
Neutrophils: 51 %
Platelets: 278 10*3/uL (ref 150–450)
RBC: 3.94 x10E6/uL (ref 3.77–5.28)
RDW: 13 % (ref 11.7–15.4)
RPR Ser Ql: NONREACTIVE
WBC: 4.4 10*3/uL (ref 3.4–10.8)

## 2022-03-05 LAB — CERVICOVAGINAL ANCILLARY ONLY
Bacterial Vaginitis (gardnerella): NEGATIVE
Candida Glabrata: NEGATIVE
Candida Vaginitis: NEGATIVE
Chlamydia: NEGATIVE
Comment: NEGATIVE
Comment: NEGATIVE
Comment: NEGATIVE
Comment: NEGATIVE
Comment: NEGATIVE
Comment: NORMAL
Neisseria Gonorrhea: NEGATIVE
Trichomonas: NEGATIVE

## 2022-03-10 ENCOUNTER — Encounter: Payer: Self-pay | Admitting: Licensed Practical Nurse

## 2022-03-14 ENCOUNTER — Telehealth: Payer: Self-pay

## 2022-03-14 NOTE — Telephone Encounter (Signed)
Erskine Squibb and I advised Grace [name] to send pt to ER.

## 2022-03-14 NOTE — Telephone Encounter (Signed)
Patient contacted office stating that she would like to be fit in today for appointment to have her cervix checked. Patient states that last night around 10PM she began have pain and pressure in her pelvic region and describes pain as a shooting pain. Patient states "pain is on and off but I feel like I have to poop." Patient denied any signs of contractions and states that she has shooting pain when moving that can last up to 30 seconds but when at rest pain is resolved. We have no openings in office please advise. KW

## 2022-03-18 ENCOUNTER — Ambulatory Visit (INDEPENDENT_AMBULATORY_CARE_PROVIDER_SITE_OTHER): Payer: Medicaid Other | Admitting: Family Medicine

## 2022-03-18 VITALS — BP 114/70 | Wt 197.0 lb

## 2022-03-18 DIAGNOSIS — Z23 Encounter for immunization: Secondary | ICD-10-CM | POA: Diagnosis not present

## 2022-03-18 DIAGNOSIS — F339 Major depressive disorder, recurrent, unspecified: Secondary | ICD-10-CM

## 2022-03-18 DIAGNOSIS — F39 Unspecified mood [affective] disorder: Secondary | ICD-10-CM

## 2022-03-18 DIAGNOSIS — O1213 Gestational proteinuria, third trimester: Secondary | ICD-10-CM

## 2022-03-18 DIAGNOSIS — Z3480 Encounter for supervision of other normal pregnancy, unspecified trimester: Secondary | ICD-10-CM

## 2022-03-18 LAB — POCT URINALYSIS DIPSTICK OB
Glucose, UA: NEGATIVE
POC,PROTEIN,UA: NEGATIVE

## 2022-03-18 NOTE — Progress Notes (Signed)
ROB- TDAP and Blood transfusion consent signed today, no concerns

## 2022-03-18 NOTE — Progress Notes (Signed)
   PRENATAL VISIT NOTE  Subjective:  Grace [name] is a 21 y.o. [phone] at [redacted]w[redacted]d being seen today for ongoing prenatal care.  She is currently monitored for the following issues for this high-risk pregnancy and has Chronic allergic rhinitis; Major depression, recurrent, chronic (HCC); GAD (generalized anxiety disorder); Mood disorder (HCC); PTSD (post-traumatic stress disorder); Allergy to nuts; Primary dysmenorrhea; Supervision of other normal pregnancy, antepartum; and Proteinuria affecting pregnancy in third trimester on their problem list.  Patient reports  pelvic pressure and low BP. Reports she was in class over the weekend and her BP was 90/60 and 108/50. Reports fatigue and trouble sleeping .  Contractions: Irregular. Vag. Bleeding: None.  Movement: Present. Denies leaking of fluid.   The following portions of the patient's history were reviewed and updated as appropriate: allergies, current medications, past family history, past medical history, past social history, past surgical history and problem list.   Objective:   Vitals:   03/18/22 0951  BP: 114/70  Weight: 197 lb (89.4 kg)    Fetal Status:     Movement: Present     General:  Alert, oriented and cooperative. Patient is in no acute distress.  Skin: Skin is warm and dry. No rash noted.   Cardiovascular: Normal heart rate noted  Respiratory: Normal respiratory effort, no problems with respiration noted  Abdomen: Soft, gravid, appropriate for gestational age.  Pain/Pressure: Present     Pelvic: Cervical exam deferred        Extremities: Normal range of motion.     Mental Status: Normal mood and affect. Normal behavior. Normal judgment and thought content.   Assessment and Plan:  Pregnancy: G4P1021 at [redacted]w[redacted]d 1. Proteinuria affecting pregnancy in third trimester  2. Supervision of other normal pregnancy, antepartum Reviewed her BP and normal lowering in the 2nd trimester Discussed her pelvic pressure-- Reviewed  positioning, stretches and pelvic release  3. Mood disorder (HCC) Continues to have mood sx  4. Major depression, recurrent, chronic (HCC) Stable  Preterm labor symptoms and general obstetric precautions including but not limited to vaginal bleeding, contractions, leaking of fluid and fetal movement were reviewed in detail with the patient. Please refer to After Visit Summary for other counseling recommendations.   Return in about 2 weeks (around 04/01/2022) for Routine prenatal care.  Future Appointments  Date Time Provider Department Center  04/01/2022  9:35 AM Mirna Mires, CNM [facility] None  04/15/2022  9:35 AM Tresea Mall, CNM [facility] None    Federico Flake, MD

## 2022-03-18 NOTE — Addendum Note (Signed)
Addended by: Donnetta Hail on: 03/18/2022 01:13 PM   Modules accepted: Orders

## 2022-03-28 ENCOUNTER — Encounter: Payer: Self-pay | Admitting: Obstetrics and Gynecology

## 2022-03-28 ENCOUNTER — Encounter: Payer: Self-pay | Admitting: Licensed Practical Nurse

## 2022-03-28 ENCOUNTER — Observation Stay
Admission: EM | Admit: 2022-03-28 | Discharge: 2022-03-28 | Disposition: A | Discharge status: Home / Self Care | Payer: Medicaid Other | Attending: Obstetrics and Gynecology | Admitting: Obstetrics and Gynecology

## 2022-03-28 ENCOUNTER — Other Ambulatory Visit: Payer: Self-pay

## 2022-03-28 DIAGNOSIS — O4193X0 Disorder of amniotic fluid and membranes, unspecified, third trimester, not applicable or unspecified: Secondary | ICD-10-CM | POA: Diagnosis present

## 2022-03-28 DIAGNOSIS — O471 False labor at or after 37 completed weeks of gestation: Secondary | ICD-10-CM | POA: Insufficient documentation

## 2022-03-28 DIAGNOSIS — O99513 Diseases of the respiratory system complicating pregnancy, third trimester: Secondary | ICD-10-CM | POA: Insufficient documentation

## 2022-03-28 DIAGNOSIS — J45909 Unspecified asthma, uncomplicated: Secondary | ICD-10-CM | POA: Insufficient documentation

## 2022-03-28 DIAGNOSIS — O1213 Gestational proteinuria, third trimester: Secondary | ICD-10-CM

## 2022-03-28 DIAGNOSIS — Z87891 Personal history of nicotine dependence: Secondary | ICD-10-CM | POA: Diagnosis not present

## 2022-03-28 DIAGNOSIS — O26893 Other specified pregnancy related conditions, third trimester: Secondary | ICD-10-CM | POA: Diagnosis not present

## 2022-03-28 DIAGNOSIS — O26899 Other specified pregnancy related conditions, unspecified trimester: Secondary | ICD-10-CM | POA: Diagnosis present

## 2022-03-28 DIAGNOSIS — Z3A33 33 weeks gestation of pregnancy: Secondary | ICD-10-CM

## 2022-03-28 DIAGNOSIS — R102 Pelvic and perineal pain: Secondary | ICD-10-CM

## 2022-03-28 DIAGNOSIS — Z3480 Encounter for supervision of other normal pregnancy, unspecified trimester: Secondary | ICD-10-CM

## 2022-03-28 DIAGNOSIS — Z79899 Other long term (current) drug therapy: Secondary | ICD-10-CM | POA: Insufficient documentation

## 2022-03-28 LAB — WET PREP, GENITAL
Clue Cells Wet Prep HPF POC: NONE SEEN
Sperm: NONE SEEN
Trich, Wet Prep: NONE SEEN
WBC, Wet Prep HPF POC: <10 (ref <10)
Yeast Wet Prep HPF POC: NONE SEEN

## 2022-03-28 LAB — RUPTURE OF MEMBRANE (ROM)PLUS: Rom Plus: NEGATIVE

## 2022-03-28 LAB — CHLAMYDIA/NGC RT PCR (ARMC ONLY)
Chlamydia Tr: NOT DETECTED
N gonorrhoeae: NOT DETECTED

## 2022-03-28 NOTE — Telephone Encounter (Signed)
Adv pt she needed to go to L&D to be evaluated esp since she has a watery discharge; adv to go in thru ER; Mary notified.
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
## 2022-03-28 NOTE — OB Triage Note (Signed)
Pt presents to L/D triage with reported vaginal pressure, rated 7/10, that began 3 days ago. She also reports intermittent abdominal tightening and watery discharge since 3 days ago. Pt reports last intercourse 4 days ago. She reports that some position changes may help the pain. Pt reports no bleeding and positive fetal movement and adequate hydration. She reports no urinary symptoms.  Monitors applied and assessing, VSS.

## 2022-03-28 NOTE — OB Triage Note (Signed)
Pt discharged home per order. Pt reports not feeling any recent tightening- CNM reviewed strip and lab results. G/C pending.    Pt stable and ambulatory and an After Visit Summary was printed and given to the patient. Discharge education completed with patient/family including follow up instructions, appointments, and medication list. Pt received labor and bleeding precautions. Patient able to verbalize understanding, all questions fully answered upon discharge. Patient instructed to return to ED, call 911, or call MD for any changes in condition. Pt discharged home via personal vehicle.

## 2022-03-31 ENCOUNTER — Encounter: Payer: Self-pay | Admitting: Licensed Practical Nurse

## 2022-03-31 ENCOUNTER — Ambulatory Visit (INDEPENDENT_AMBULATORY_CARE_PROVIDER_SITE_OTHER): Payer: Medicaid Other

## 2022-03-31 ENCOUNTER — Encounter: Payer: Self-pay | Admitting: Family Medicine

## 2022-03-31 VITALS — BP 142/80 | Wt 201.0 lb

## 2022-03-31 DIAGNOSIS — Z0130 Encounter for examination of blood pressure without abnormal findings: Secondary | ICD-10-CM

## 2022-03-31 NOTE — Patient Instructions (Signed)
Hypertension During Pregnancy Hypertension is also called high blood pressure. High blood pressure means that the force of the blood moving in your body is high enough to cause problems for you and your baby. Different types of high blood pressure can happen during pregnancy. The types are: High blood pressure before you got pregnant. This is called chronic hypertension.  This can continue during your pregnancy. Your doctor will want to keep checking your blood pressure. You may need medicine to control your blood pressure while you are pregnant. You will need follow-up visits after you have your baby. High blood pressure that goes up during pregnancy when it was normal before. This is called gestational hypertension. It will often get better after you have your baby, but your doctor will need to watch your blood pressure to make sure that it is getting better. You may develop high blood pressure after giving birth. This is called postpartum hypertension. This often occurs within 48 hours after childbirth but may occur up to 6 weeks after giving birth. Very high blood pressure during pregnancy is an emergency that needs treatment right away. How does this affect me? If you have high blood pressure during pregnancy, you have a higher chance of developing high blood pressure: As you get older. If you get pregnant again. In some cases, high blood pressure during pregnancy can cause: Stroke. Heart attack. Damage to the kidneys, lungs, or liver. Preeclampsia. HELLP syndrome. Seizures. Problems with the placenta. How does this affect my baby? Your baby may: Be born early. Not weigh as much as he or she should. Not handle labor well, leading to a C-section. This condition may also result in a baby's death before birth (stillbirth). What are the risks? Having high blood pressure during a past pregnancy. Being overweight. Being age 35 or older. Being pregnant for the first time. Being pregnant  with more than one baby. Becoming pregnant using fertility methods, such as IVF. Having other problems, such as diabetes or kidney disease. What can I do to lower my risk?  Keep a healthy weight. Eat a healthy diet. Follow what your doctor tells you about treating any medical problems that you had before you got pregnant. It is very important to go to all of your doctor visits. Your doctor will check your blood pressure and make sure that your pregnancy is progressing as it should. Treatment should start early if a problem is found. How is this treated? Treatment for high blood pressure during pregnancy can vary. It depends on the type of high blood pressure you have and how serious it is. If you were taking medicine for your blood pressure before you got pregnant, talk with your doctor. You may need to change the medicine during pregnancy if it is not safe for your baby. If your blood pressure goes up during pregnancy, your doctor may order medicine to treat this. If you are at risk for preeclampsia, your doctor may tell you to take a low-dose aspirin while you are pregnant. If you have very high blood pressure, you may need to stay in the hospital so you and your baby can be watched closely. You may also need to take medicine to lower your blood pressure. In some cases, if your condition gets worse, you may need to have your baby early. Follow these instructions at home: Eating and drinking  Drink enough fluid to keep your pee (urine) pale yellow. Avoid caffeine. Lifestyle Do not smoke or use any products that contain   nicotine or tobacco. If you need help quitting, ask your doctor. Do not use alcohol or drugs. Avoid stress. Rest and get plenty of sleep. Regular exercise can help. Ask your doctor what kinds of exercise are best for you. General instructions Take over-the-counter and prescription medicines only as told by your doctor. Keep all prenatal and follow-up visits. Contact a  doctor if: You have symptoms that your doctor told you to watch for, such as: Headaches. A feeling like you may vomit (nausea). Vomiting. Belly (abdominal) pain. Feeling dizzy or light-headed. Get help right away if: You have symptoms of serious problems, such as: Very bad belly pain that does not get better with treatment. A very bad headache that does not get better. Blurry vision. Double vision. Vomiting that does not get better. Sudden, fast weight gain. Sudden swelling in your hands, ankles, or face. Bleeding from your vagina. Blood in your pee. Shortness of breath. Chest pain. Weakness on one side of your body. Trouble talking. Your baby is not moving as much as usual. These symptoms may be an emergency. Get help right away. Call your local emergency services (911 in the [place] Do not wait to see if the symptoms will go away. Do not drive yourself to the hospital. Summary High blood pressure is also called hypertension. High blood pressure means that the force of the blood moving in your body is high enough to cause problems for you and your baby. Get help right away if you have symptoms of serious problems due to high blood pressure. Keep all prenatal and follow-up visits. This information is not intended to replace advice given to you by your health care provider. Make sure you discuss any questions you have with your health care provider. Document Revised: 05/03/2020 Document Reviewed: 05/03/2020 Elsevier Patient Education  2023 [employer]  

## 2022-04-01 ENCOUNTER — Encounter: Payer: Medicaid Other | Admitting: Obstetrics

## 2022-04-01 ENCOUNTER — Ambulatory Visit (INDEPENDENT_AMBULATORY_CARE_PROVIDER_SITE_OTHER): Payer: Medicaid Other | Admitting: Obstetrics

## 2022-04-01 ENCOUNTER — Encounter: Payer: Self-pay | Admitting: Obstetrics and Gynecology

## 2022-04-01 ENCOUNTER — Other Ambulatory Visit: Payer: Self-pay

## 2022-04-01 ENCOUNTER — Observation Stay
Admission: EM | Admit: 2022-04-01 | Discharge: 2022-04-01 | Disposition: A | Discharge status: Home / Self Care | Payer: Medicaid Other | Attending: Advanced Practice Midwife | Admitting: Advanced Practice Midwife

## 2022-04-01 VITALS — BP 146/96 | Wt 200.0 lb

## 2022-04-01 DIAGNOSIS — O99513 Diseases of the respiratory system complicating pregnancy, third trimester: Secondary | ICD-10-CM | POA: Insufficient documentation

## 2022-04-01 DIAGNOSIS — Z3A33 33 weeks gestation of pregnancy: Secondary | ICD-10-CM | POA: Diagnosis not present

## 2022-04-01 DIAGNOSIS — O26893 Other specified pregnancy related conditions, third trimester: Secondary | ICD-10-CM

## 2022-04-01 DIAGNOSIS — Z3480 Encounter for supervision of other normal pregnancy, unspecified trimester: Secondary | ICD-10-CM

## 2022-04-01 DIAGNOSIS — O163 Unspecified maternal hypertension, third trimester: Secondary | ICD-10-CM

## 2022-04-01 DIAGNOSIS — J45909 Unspecified asthma, uncomplicated: Secondary | ICD-10-CM | POA: Insufficient documentation

## 2022-04-01 DIAGNOSIS — R519 Headache, unspecified: Secondary | ICD-10-CM

## 2022-04-01 DIAGNOSIS — Z87891 Personal history of nicotine dependence: Secondary | ICD-10-CM | POA: Insufficient documentation

## 2022-04-01 DIAGNOSIS — O99891 Other specified diseases and conditions complicating pregnancy: Principal | ICD-10-CM | POA: Insufficient documentation

## 2022-04-01 DIAGNOSIS — Z79899 Other long term (current) drug therapy: Secondary | ICD-10-CM | POA: Insufficient documentation

## 2022-04-01 HISTORY — DX: Unspecified maternal hypertension, third trimester: O16.3

## 2022-04-01 LAB — COMPREHENSIVE METABOLIC PANEL
ALT: 7 U/L (ref 0–44)
AST: 12 U/L — ABNORMAL LOW (ref 15–41)
Albumin: 2.8 g/dL — ABNORMAL LOW (ref 3.5–5.0)
Alkaline Phosphatase: 126 U/L (ref 38–126)
Anion gap: 5 (ref 5–15)
BUN: <5 mg/dL — ABNORMAL LOW (ref 6–20)
CO2: 25 mmol/L (ref 22–32)
Calcium: 8.9 mg/dL (ref 8.9–10.3)
Chloride: 108 mmol/L (ref 98–111)
Creatinine, Ser: 0.62 mg/dL (ref 0.44–1.00)
GFR, Estimated: >60 mL/min (ref >60)
Glucose, Bld: 100 mg/dL — ABNORMAL HIGH (ref 70–99)
Potassium: 3.3 mmol/L — ABNORMAL LOW (ref 3.5–5.1)
Sodium: 138 mmol/L (ref 135–145)
Total Bilirubin: 0.5 mg/dL (ref 0.3–1.2)
Total Protein: 6.2 g/dL — ABNORMAL LOW (ref 6.5–8.1)

## 2022-04-01 LAB — URINALYSIS, COMPLETE (UACMP) WITH MICROSCOPIC
Bilirubin Urine: NEGATIVE
Glucose, UA: NEGATIVE mg/dL
Hgb urine dipstick: NEGATIVE
Ketones, ur: NEGATIVE mg/dL
Nitrite: NEGATIVE
Protein, ur: NEGATIVE mg/dL
Specific Gravity, Urine: 1.012 (ref 1.005–1.030)
pH: 7 (ref 5.0–8.0)

## 2022-04-01 LAB — CBC
HCT: 32.5 % — ABNORMAL LOW (ref 36.0–46.0)
Hemoglobin: 10.9 g/dL — ABNORMAL LOW (ref 12.0–15.0)
MCH: 27.9 pg (ref 26.0–34.0)
MCHC: 33.5 g/dL (ref 30.0–36.0)
MCV: 83.1 fL (ref 80.0–100.0)
Platelets: 270 10*3/uL (ref 150–400)
RBC: 3.91 MIL/uL (ref 3.87–5.11)
RDW: 13.1 % (ref 11.5–15.5)
WBC: 6.3 10*3/uL (ref 4.0–10.5)
nRBC: 0 % (ref 0.0–0.2)

## 2022-04-01 LAB — PROTEIN / CREATININE RATIO, URINE
Creatinine, Urine: 169 mg/dL
Protein Creatinine Ratio: 0.11 mg/mg{Cre} (ref 0.00–0.15)
Total Protein, Urine: 18 mg/dL

## 2022-04-01 NOTE — Progress Notes (Signed)
Pt states she has had HA's that do not go away with tylenol. No vb. No lof. Higher BP the past few days.
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
## 2022-04-01 NOTE — Discharge Instructions (Addendum)
Headache: increase hydration- urine should always be clear to light yellow, get 7-8 hours of sleep per night, tylenol, magnesium over the counter, caffeine as needed, consider acupuncture or massage

## 2022-04-01 NOTE — Progress Notes (Signed)
Routine Prenatal Care Visit  Subjective  Grace [name] is a 21 y.o. [phone] at [redacted]w[redacted]d being seen today for ongoing prenatal care.  She is currently monitored for the following issues for this low-risk pregnancy and has Chronic allergic rhinitis; Major depression, recurrent, chronic (HCC); GAD (generalized anxiety disorder); Mood disorder (HCC); PTSD (post-traumatic stress disorder); Allergy to nuts; Primary dysmenorrhea; Supervision of other normal pregnancy, antepartum; Proteinuria affecting pregnancy in third trimester; and Pelvic pressure in pregnancy on their problem list.  ----------------------------------------------------------------------------------- Patient reports headache.   Contractions: Irregular. Vag. Bleeding: None.  Movement: Present. Leaking Fluid denies.  ----------------------------------------------------------------------------------- The following portions of the patient's history were reviewed and updated as appropriate: allergies, current medications, past family history, past medical history, past social history, past surgical history and problem list. Problem list updated.  Objective  Blood pressure (!) 146/96, weight 200 lb (90.7 kg), last menstrual period 07/23/2021, unknown if currently breastfeeding. Pregravid weight 208 lb (94.3 kg) Total Weight Gain -8 lb (-3.629 kg) Urinalysis: Urine Protein    Urine Glucose    Fetal Status:     Movement: Present     General:  Alert, oriented and cooperative. Patient is in no acute distress.  Skin: Skin is warm and dry. No rash noted.   Cardiovascular: Normal heart rate noted  Respiratory: Normal respiratory effort, no problems with respiration noted  Abdomen: Soft, gravid, appropriate for gestational age. Pain/Pressure: Present     Pelvic:  Cervical exam deferred        Extremities: Normal range of motion.  Edema: Trace  Mental Status: Normal mood and affect. Normal behavior. Normal judgment and thought content.    Assessment   21 y.o. [id number] at [redacted]w[redacted]d by  05/16/2022, by Ultrasound presenting for routine prenatal visit  Plan   pregnancy Problems (from 09/16/21 to present)    Problem Noted Resolved   Proteinuria affecting pregnancy in third trimester 02/08/2022 by Federico Flake, MD No   Overview Signed 02/08/2022  9:21 AM by Federico Flake, MD    6/13 UPC=371mg       Supervision of other normal pregnancy, antepartum 10/07/2021 by Ellwood Sayers, CNM No   Overview Addendum 01/08/2022 10:39 AM by Ellwood Sayers, CNM     Nursing Staff Provider  Office Location  [facility] Dating  [redacted]w[redacted]d on 2/7  Language  English  Anatomy US  Normal   Flu Vaccine   Genetic Screen  NIPS: neg, XX  TDaP vaccine    Hgb A1C or  GTT Early :5.3 Third trimester :   Covid    LAB RESULTS   Rhogam  NA Blood Type A/Positive/-- (02/24 1114)   Feeding Plan  Antibody Negative (02/24 1114)  Contraception  Rubella 2.52 (02/24 1114)  Circumcision NA RPR Non Reactive (02/24 1114)   Pediatrician  Gavin [facility]ters [place]  HBsAg Negative (02/24 1114)   Support Person [place]  HIV Non Reactive (02/24 1114)  Prenatal Classes multip  Varicella Immune    GBS  (For PCN allergy, check sensitivities)   BTL Consent     VBAC Consent  Pap  ASC-US HPV pos     Hgb Electro    Pelvis Tested  CF      SMA                   Preterm labor symptoms and general obstetric precautions including but not limited to vaginal bleeding, contractions, leaking of fluid and fetal movement were reviewed in detail with the patient. Please refer to  After Visit Summary for other counseling recommendations.  She is sent over to Labor an Delivery for an NST, PIH labs. Tresea Mall notified. Mirna Mires, CNM  04/01/2022 1:58 PM    No follow-ups on file.  Mirna Mires, CNM  04/01/2022 1:43 PM

## 2022-04-01 NOTE — OB Triage Note (Signed)
Pt G4P1 [redacted]w[redacted]d presents from office for [facility] workup. BP in office 146/96. Pt reports HA for the past week that has got worse over the last 3 days. Pt reports taking 1000mg  tylenol daily that does not help with the pain. Pt denies vision changes/epigastric pain. No abd swelling. +1 reflexes. No clonus. +FM. Denies bleeding, LOF. BP cycling Q15 minutes. CNM made aware.

## 2022-04-02 ENCOUNTER — Encounter: Payer: Self-pay | Admitting: Advanced Practice Midwife

## 2022-04-02 ENCOUNTER — Telehealth: Payer: Self-pay

## 2022-04-02 NOTE — Telephone Encounter (Signed)
Please advise 

## 2022-04-02 NOTE — Patient Outreach (Signed)
Care Coordination  04/02/2022  Grace [name] July 14, 2001 [id number]  Transition Care Management Follow-up Telephone Call Date of discharge and from where: 04/01/22 [facility] How have you been since you were released from the hospital? The same, I am waiting on my OB to message me back. Any questions or concerns? No  Items Reviewed: Did the pt receive and understand the discharge instructions provided? Yes  Medications obtained and verified? No  Other? No  Any new allergies since your discharge? No  Dietary orders reviewed? No Do you have support at home? Yes   Home Care and Equipment/Supplies: Were home health services ordered? not applicable If so, what is the name of the agency?  Has the agency set up a time to come to the patient's home? not applicable Were any new equipment or medical supplies ordered?  No What is the name of the medical supply agency?  Were you able to get the supplies/equipment? not applicable Do you have any questions related to the use of the equipment or supplies? No  Functional Questionnaire: (I = Independent and D = Dependent) ADLs: I  Bathing/Dressing- I  Meal Prep- I  Eating- I  Maintaining continence- I  Transferring/Ambulation- I  Managing Meds- I  Follow up appointments reviewed:  PCP Hospital f/u appt confirmed? Yes  Scheduled to see Lidiya on 04/11/22 @ 9:35. Specialist Hospital f/u appt confirmed? No  Scheduled to see  Are transportation arrangements needed? No  If their condition worsens, is the pt aware to call PCP or go to the Emergency Dept.? Yes Was the patient provided with contact information for the PCP's office or ED? Yes Was to pt encouraged to call back with questions or concerns? Yes

## 2022-04-03 ENCOUNTER — Ambulatory Visit (INDEPENDENT_AMBULATORY_CARE_PROVIDER_SITE_OTHER): Payer: Medicaid Other | Admitting: Licensed Practical Nurse

## 2022-04-03 ENCOUNTER — Observation Stay
Admission: EM | Admit: 2022-04-03 | Discharge: 2022-04-03 | Disposition: A | Discharge status: Home / Self Care | Payer: Medicaid Other | Attending: Advanced Practice Midwife | Admitting: Advanced Practice Midwife

## 2022-04-03 ENCOUNTER — Other Ambulatory Visit: Payer: Self-pay | Admitting: Advanced Practice Midwife

## 2022-04-03 ENCOUNTER — Encounter: Payer: Self-pay | Admitting: Obstetrics and Gynecology

## 2022-04-03 ENCOUNTER — Other Ambulatory Visit: Payer: Self-pay

## 2022-04-03 VITALS — BP 148/100

## 2022-04-03 DIAGNOSIS — Z3A33 33 weeks gestation of pregnancy: Secondary | ICD-10-CM | POA: Diagnosis not present

## 2022-04-03 DIAGNOSIS — Z87891 Personal history of nicotine dependence: Secondary | ICD-10-CM | POA: Insufficient documentation

## 2022-04-03 DIAGNOSIS — O163 Unspecified maternal hypertension, third trimester: Secondary | ICD-10-CM

## 2022-04-03 DIAGNOSIS — Z3480 Encounter for supervision of other normal pregnancy, unspecified trimester: Secondary | ICD-10-CM | POA: Diagnosis not present

## 2022-04-03 DIAGNOSIS — Z8759 Personal history of other complications of pregnancy, childbirth and the puerperium: Secondary | ICD-10-CM | POA: Diagnosis not present

## 2022-04-03 DIAGNOSIS — Z79899 Other long term (current) drug therapy: Secondary | ICD-10-CM | POA: Insufficient documentation

## 2022-04-03 DIAGNOSIS — R102 Pelvic and perineal pain: Secondary | ICD-10-CM | POA: Diagnosis not present

## 2022-04-03 DIAGNOSIS — O26893 Other specified pregnancy related conditions, third trimester: Secondary | ICD-10-CM

## 2022-04-03 DIAGNOSIS — O99513 Diseases of the respiratory system complicating pregnancy, third trimester: Secondary | ICD-10-CM | POA: Insufficient documentation

## 2022-04-03 DIAGNOSIS — O99891 Other specified diseases and conditions complicating pregnancy: Secondary | ICD-10-CM | POA: Insufficient documentation

## 2022-04-03 DIAGNOSIS — R519 Headache, unspecified: Secondary | ICD-10-CM

## 2022-04-03 DIAGNOSIS — O1213 Gestational proteinuria, third trimester: Secondary | ICD-10-CM

## 2022-04-03 DIAGNOSIS — J45909 Unspecified asthma, uncomplicated: Secondary | ICD-10-CM | POA: Diagnosis not present

## 2022-04-03 LAB — URINE CULTURE

## 2022-04-03 LAB — PROTEIN / CREATININE RATIO, URINE
Creatinine, Urine: 136 mg/dL
Protein Creatinine Ratio: 0.1 mg/mg{Cre} (ref 0.00–0.15)
Total Protein, Urine: 14 mg/dL

## 2022-04-03 LAB — CBC
HCT: 31.7 % — ABNORMAL LOW (ref 36.0–46.0)
Hemoglobin: 10.5 g/dL — ABNORMAL LOW (ref 12.0–15.0)
MCH: 27.4 pg (ref 26.0–34.0)
MCHC: 33.1 g/dL (ref 30.0–36.0)
MCV: 82.8 fL (ref 80.0–100.0)
Platelets: 260 10*3/uL (ref 150–400)
RBC: 3.83 MIL/uL — ABNORMAL LOW (ref 3.87–5.11)
RDW: 13 % (ref 11.5–15.5)
WBC: 6.6 10*3/uL (ref 4.0–10.5)
nRBC: 0 % (ref 0.0–0.2)

## 2022-04-03 LAB — COMPREHENSIVE METABOLIC PANEL
ALT: 6 U/L (ref 0–44)
AST: 12 U/L — ABNORMAL LOW (ref 15–41)
Albumin: 2.8 g/dL — ABNORMAL LOW (ref 3.5–5.0)
Alkaline Phosphatase: 138 U/L — ABNORMAL HIGH (ref 38–126)
Anion gap: 7 (ref 5–15)
BUN: <5 mg/dL — ABNORMAL LOW (ref 6–20)
CO2: 21 mmol/L — ABNORMAL LOW (ref 22–32)
Calcium: 8.6 mg/dL — ABNORMAL LOW (ref 8.9–10.3)
Chloride: 110 mmol/L (ref 98–111)
Creatinine, Ser: 0.64 mg/dL (ref 0.44–1.00)
GFR, Estimated: >60 mL/min (ref >60)
Glucose, Bld: 87 mg/dL (ref 70–99)
Potassium: 3.6 mmol/L (ref 3.5–5.1)
Sodium: 138 mmol/L (ref 135–145)
Total Bilirubin: 0.6 mg/dL (ref 0.3–1.2)
Total Protein: 6.4 g/dL — ABNORMAL LOW (ref 6.5–8.1)

## 2022-04-03 MED ORDER — OXYCODONE-ACETAMINOPHEN 5-325 MG PO TABS: 1.0000 | ORAL_TABLET | Freq: Four times a day (QID) | ORAL | 0 refills | Status: AC | PRN

## 2022-04-03 NOTE — OB Triage Note (Signed)
Patient sent from office for PreE evaluation. Patient states she had a routine OB appointment today with an elevated BP. Pt states she has had a HA for 7 days and has taken, tylenol (last at 0900), Magnesium, and caffeine, none of which have helped. Patient also states she had epigastric pain that she took gasX for at 0200. Denies Visual changes, LOF, or vaginal bleeing. Complaints of lower abdominal pressure x 4 days. Positive fetal movement retorted. Monitors applied and assessing, VSS.
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
## 2022-04-03 NOTE — Progress Notes (Signed)
Pt still having headaches and chest pain. Very "uncomfortable in pelvic area" . BP recheck 146/102

## 2022-04-03 NOTE — Progress Notes (Signed)
Referral to MFM for elevated BPs in office and on home cuff/normal PIH work ups at [facility]/intractable headache

## 2022-04-03 NOTE — OB Triage Note (Signed)
Pt discharged home per order.  Pt stable and ambulatory and an After Visit Summary was printed and given to the patient. Discharge education completed with patient/family including follow up instructions, appointments, and medication list. Pt received labor and bleeding precautions. Patient able to verbalize understanding, all questions fully answered upon discharge. Patient instructed to return to ED, call 911, or call MD for any changes in condition. Pt discharged home via personal vehicle.

## 2022-04-03 NOTE — Progress Notes (Signed)
Routine Prenatal Care Visit  Subjective  Grace [name] is a 21 y.o. [phone] at [redacted]w[redacted]d being seen today for ongoing prenatal care.  She is currently monitored for the following issues for this low-risk pregnancy and has Chronic allergic rhinitis; Major depression, recurrent, chronic (HCC); GAD (generalized anxiety disorder); Mood disorder (HCC); PTSD (post-traumatic stress disorder); Allergy to nuts; Primary dysmenorrhea; Supervision of other normal pregnancy, antepartum; Proteinuria affecting pregnancy in third trimester; Pelvic pressure in pregnancy; Elevated blood pressure affecting pregnancy in third trimester, antepartum; Headache in pregnancy, antepartum, third trimester; and [redacted] weeks gestation of pregnancy on their problem list.  ----------------------------------------------------------------------------------- Patient reports Has had a Headache x 5 days.  It was a "4/10" but now it is a "710", the pain is on the top her head.  It is crushing. Denies visual disturbances but does have epigastric pain and pressure in her chest, she thought she had reflux and took an antacid, but she still has that discomfort. She last took 1,000mg  Tylenol this morning.  She has tried Magnesium. Grace [name] also reports pelvic pain and pressure, she has ordered a support belt, but it has not arrived yet.  She has tried massage and position change but nothing has help with the pelvic discomfort.  Contractions: Irregular. Vag. Bleeding: None.  Movement: Present. Leaking Fluid denies.  ----------------------------------------------------------------------------------- The following portions of the patient's history were reviewed and updated as appropriate: allergies, current medications, past family history, past medical history, past social history, past surgical history and problem list. Problem list updated.  Objective  Blood pressure (!) 148/100, last menstrual period 07/23/2021, unknown if currently  breastfeeding. Pregravid weight 208 lb (94.3 kg) Total Weight Gain -8 lb (-3.629 kg) Urinalysis: Urine Protein    Urine Glucose    Fetal Status: Fetal Heart Rate (bpm): 140   Movement: Present     General:  Alert, oriented and cooperative. Patient is in no acute distress.  Skin: Skin is warm and dry. No rash noted.   Cardiovascular: Normal heart rate noted  Respiratory: Normal respiratory effort, no problems with respiration noted  Abdomen: Soft, gravid, appropriate for gestational age. Pain/Pressure: Present     Pelvic:  Cervical exam deferred        Extremities: Normal range of motion.  Edema: Trace  Mental Status: Normal mood and affect. Normal behavior. Normal judgment and thought content.   Assessment   21 y.o. [id number] at [redacted]w[redacted]d by  05/16/2022, by Ultrasound presenting for work-in prenatal visit  Plan   pregnancy Problems (from 09/16/21 to present)     Problem Noted Resolved   Headache in pregnancy, antepartum, third trimester 04/01/2022 by Grace [name], CNM No   [redacted] weeks gestation of pregnancy 04/01/2022 by Grace [name], CNM No   Proteinuria affecting pregnancy in third trimester 02/08/2022 by Grace Flake, MD No   Overview Signed 02/08/2022  9:21 AM by Grace Flake, MD    6/13 UPC=371mg       Supervision of other normal pregnancy, antepartum 10/07/2021 by Grace [name], CNM No   Overview Addendum 01/08/2022 10:39 AM by Grace [name], CNM     Nursing Staff Provider  Office Location  [facility] Dating  [redacted]w[redacted]d on 2/7  Language  English  Anatomy US  Normal   Flu Vaccine   Genetic Screen  NIPS: neg, XX  TDaP vaccine    Hgb A1C or  GTT Early :5.3 Third trimester :   Covid    LAB RESULTS   Rhogam  NA Blood Type A/Positive/-- (  02/24 1114)   Feeding Plan  Antibody Negative (02/24 1114)  Contraception  Rubella 2.52 (02/24 1114)  Circumcision NA RPR Non Reactive (02/24 1114)   Pediatrician  Grace [name]  HBsAg Negative (02/24 1114)   Support Person  Grace [name]  HIV Non Reactive (02/24 1114)  Prenatal Classes multip  Varicella Immune    GBS  (For PCN allergy, check sensitivities)   BTL Consent     VBAC Consent  Pap  ASC-US HPV pos     Hgb Electro    Pelvis Tested  CF      SMA                    Preterm labor symptoms and general obstetric precautions including but not limited to vaginal bleeding, contractions, leaking of fluid and fetal movement were reviewed in detail with the patient. Please refer to After Visit Summary for other counseling recommendations.   Instructed to go to L and Triage, Grace [name] CNM and L and D notified   Grace [name], CNM  Grace [name], [facility]  04/03/22  3:52 PM

## 2022-04-03 NOTE — Telephone Encounter (Signed)
Please schedule

## 2022-04-04 NOTE — Progress Notes (Signed)
Chief Complaint  Patient presents with   Hypertension    Patient presented in office today with concerns of hypertension for the past 48hrs. Patient reports blood pressure readings at home >140/100 and reports frequent headaches and edema. Patient denied symptoms of chest pain,pressure, blurred vision, nausea, light headiness or weakness. Patient reports previous pregnancy history of gestational hypertension. Patient has follow up scheduled in office for 04/01/22.    Vitals:   03/31/22 1557  BP: (!) 142/80   Grace [name]. W, NCMA

## 2022-04-05 LAB — URINE CULTURE

## 2022-04-07 ENCOUNTER — Other Ambulatory Visit: Payer: Self-pay | Admitting: Advanced Practice Midwife

## 2022-04-07 DIAGNOSIS — Z3A33 33 weeks gestation of pregnancy: Secondary | ICD-10-CM

## 2022-04-07 DIAGNOSIS — O163 Unspecified maternal hypertension, third trimester: Secondary | ICD-10-CM

## 2022-04-07 DIAGNOSIS — O26893 Other specified pregnancy related conditions, third trimester: Secondary | ICD-10-CM

## 2022-04-07 DIAGNOSIS — Z363 Encounter for antenatal screening for malformations: Secondary | ICD-10-CM

## 2022-04-08 ENCOUNTER — Other Ambulatory Visit: Payer: Self-pay | Admitting: Advanced Practice Midwife

## 2022-04-08 ENCOUNTER — Other Ambulatory Visit: Payer: Self-pay | Admitting: *Deleted

## 2022-04-08 ENCOUNTER — Encounter: Payer: Self-pay | Admitting: *Deleted

## 2022-04-08 ENCOUNTER — Ambulatory Visit (HOSPITAL_BASED_OUTPATIENT_CLINIC_OR_DEPARTMENT_OTHER): Payer: Medicaid Other | Admitting: Obstetrics

## 2022-04-08 ENCOUNTER — Telehealth: Payer: Self-pay | Admitting: Obstetrics and Gynecology

## 2022-04-08 ENCOUNTER — Ambulatory Visit: Payer: Medicaid Other | Admitting: *Deleted

## 2022-04-08 ENCOUNTER — Ambulatory Visit: Payer: Medicaid Other | Attending: Obstetrics and Gynecology

## 2022-04-08 VITALS — BP 139/87 | HR 96

## 2022-04-08 DIAGNOSIS — O1213 Gestational proteinuria, third trimester: Secondary | ICD-10-CM | POA: Diagnosis not present

## 2022-04-08 DIAGNOSIS — O365930 Maternal care for other known or suspected poor fetal growth, third trimester, not applicable or unspecified: Secondary | ICD-10-CM | POA: Diagnosis not present

## 2022-04-08 DIAGNOSIS — Z3A33 33 weeks gestation of pregnancy: Secondary | ICD-10-CM

## 2022-04-08 DIAGNOSIS — O26893 Other specified pregnancy related conditions, third trimester: Secondary | ICD-10-CM

## 2022-04-08 DIAGNOSIS — O163 Unspecified maternal hypertension, third trimester: Secondary | ICD-10-CM

## 2022-04-08 DIAGNOSIS — Z3480 Encounter for supervision of other normal pregnancy, unspecified trimester: Secondary | ICD-10-CM | POA: Insufficient documentation

## 2022-04-08 DIAGNOSIS — R519 Headache, unspecified: Secondary | ICD-10-CM | POA: Insufficient documentation

## 2022-04-08 DIAGNOSIS — O133 Gestational [pregnancy-induced] hypertension without significant proteinuria, third trimester: Secondary | ICD-10-CM

## 2022-04-08 DIAGNOSIS — O139 Gestational [pregnancy-induced] hypertension without significant proteinuria, unspecified trimester: Secondary | ICD-10-CM | POA: Diagnosis not present

## 2022-04-08 DIAGNOSIS — Z3A34 34 weeks gestation of pregnancy: Secondary | ICD-10-CM

## 2022-04-08 DIAGNOSIS — O365990 Maternal care for other known or suspected poor fetal growth, unspecified trimester, not applicable or unspecified: Secondary | ICD-10-CM

## 2022-04-08 DIAGNOSIS — R809 Proteinuria, unspecified: Secondary | ICD-10-CM | POA: Diagnosis not present

## 2022-04-08 DIAGNOSIS — Z363 Encounter for antenatal screening for malformations: Secondary | ICD-10-CM | POA: Insufficient documentation

## 2022-04-08 NOTE — Progress Notes (Signed)
MFM Note  Grace [name] was seen due to recently diagnosed gestational hypertension.    The patient presented to [name] 5 days ago complaining of a headache.  She was noted to have elevated blood pressures in the 140s over 90s to 100s range.  Her PIH labs were all within normal limits and her P/C ratio was 0.10 indicating that she does not have preeclampsia.    She has not received a course of antenatal corticosteroids.   The patient continues to complain of a mild headache.    Her blood pressures in our office today were 148/89 and 139/87.  She reports that her blood pressures at home have been in the 140s over 80s to 90s range.  On today's exam, the EFW of 4 pounds 8 ounces measures at the 7th percentile for her gestational age indicating IUGR.  There was normal amniotic fluid noted.    A biophysical profile performed today was 8 out of 8.    Doppler studies of the umbilical arteries showed a normal S/D ratio of 2.58.  There were no signs of absent or reversed end-diastolic flow.    The following were discussed during today's consultation:  Gestational hypertension with IUGR  The increased risk of preeclampsia due to gestational hypertension and IUGR was discussed.  Preeclampsia precautions were reviewed.  The patient was advised to continue to monitor her blood pressures at home.   The goal for her delivery would be at 37 weeks (in about 2 weeks).    However, delivery prior to 37 weeks will be indicated should her blood pressures be persistently in the 150s over high 90s or low 100s range.    Delivery prior to 37 weeks may also be indicated should she complain of worsening signs and symptoms of preeclampsia.   Due to IUGR, she will return to our office next week for another BPP and umbilical artery Doppler study.  She will not need a course of antenatal corticosteroids should she require delivery at 36 weeks or greater.  The patient stated that all of her questions were answered  today.  She is happy and comfortable with the recommended management plan.  A total of 45 minutes was spent counseling and coordinating the care for this patient.  Greater than 50% of the time was spent in direct face-to-face contact.  Recommendations: Delivery by 37 weeks   Delivery prior to 37 weeks would be indicated should her blood pressures be persistently above 150s over high 90s to low 100s range or should she complain of signs and symptoms of preeclampsia  Continue weekly fetal testing and umbilical artery Doppler studies

## 2022-04-08 NOTE — Patient Outreach (Signed)
Transitional Care Management Follow-up Telephone Call  Grace [name] was discharged from the Maternal Assessment Unit, has or will have a pregnancy risk assessment at next visit, has a scheduled follow up OB appointment with the [facility] provider on 04/08/22 date, and has been referred for appropriate case management and ongoing follow up.   Kathi Der RN, BSN [place]  [employer], production - Managed Medicaid High Risk [phone].

## 2022-04-09 ENCOUNTER — Encounter: Payer: Self-pay | Admitting: Obstetrics and Gynecology

## 2022-04-09 ENCOUNTER — Observation Stay
Admission: EM | Admit: 2022-04-09 | Discharge: 2022-04-10 | Disposition: A | Discharge status: Home / Self Care | Payer: Medicaid Other | Attending: Obstetrics and Gynecology | Admitting: Obstetrics and Gynecology

## 2022-04-09 ENCOUNTER — Telehealth: Payer: Self-pay | Admitting: Licensed Clinical Social Worker

## 2022-04-09 ENCOUNTER — Other Ambulatory Visit: Payer: Self-pay

## 2022-04-09 DIAGNOSIS — J45909 Unspecified asthma, uncomplicated: Secondary | ICD-10-CM | POA: Insufficient documentation

## 2022-04-09 DIAGNOSIS — R109 Unspecified abdominal pain: Secondary | ICD-10-CM | POA: Insufficient documentation

## 2022-04-09 DIAGNOSIS — Z79899 Other long term (current) drug therapy: Secondary | ICD-10-CM | POA: Insufficient documentation

## 2022-04-09 DIAGNOSIS — R519 Headache, unspecified: Secondary | ICD-10-CM | POA: Diagnosis not present

## 2022-04-09 DIAGNOSIS — Z3A33 33 weeks gestation of pregnancy: Secondary | ICD-10-CM

## 2022-04-09 DIAGNOSIS — Z3480 Encounter for supervision of other normal pregnancy, unspecified trimester: Secondary | ICD-10-CM

## 2022-04-09 DIAGNOSIS — O26893 Other specified pregnancy related conditions, third trimester: Secondary | ICD-10-CM | POA: Diagnosis present

## 2022-04-09 DIAGNOSIS — O99513 Diseases of the respiratory system complicating pregnancy, third trimester: Secondary | ICD-10-CM | POA: Diagnosis not present

## 2022-04-09 DIAGNOSIS — O99891 Other specified diseases and conditions complicating pregnancy: Principal | ICD-10-CM | POA: Insufficient documentation

## 2022-04-09 DIAGNOSIS — Z87891 Personal history of nicotine dependence: Secondary | ICD-10-CM | POA: Insufficient documentation

## 2022-04-09 DIAGNOSIS — O133 Gestational [pregnancy-induced] hypertension without significant proteinuria, third trimester: Secondary | ICD-10-CM | POA: Insufficient documentation

## 2022-04-09 DIAGNOSIS — O26899 Other specified pregnancy related conditions, unspecified trimester: Secondary | ICD-10-CM | POA: Diagnosis present

## 2022-04-09 DIAGNOSIS — Z3A34 34 weeks gestation of pregnancy: Secondary | ICD-10-CM | POA: Diagnosis not present

## 2022-04-09 DIAGNOSIS — O1213 Gestational proteinuria, third trimester: Secondary | ICD-10-CM

## 2022-04-09 LAB — COMPREHENSIVE METABOLIC PANEL
ALT: 7 U/L (ref 0–44)
AST: 12 U/L — ABNORMAL LOW (ref 15–41)
Albumin: 3 g/dL — ABNORMAL LOW (ref 3.5–5.0)
Alkaline Phosphatase: 144 U/L — ABNORMAL HIGH (ref 38–126)
Anion gap: 6 (ref 5–15)
BUN: <5 mg/dL — ABNORMAL LOW (ref 6–20)
CO2: 23 mmol/L (ref 22–32)
Calcium: 8.7 mg/dL — ABNORMAL LOW (ref 8.9–10.3)
Chloride: 106 mmol/L (ref 98–111)
Creatinine, Ser: 0.53 mg/dL (ref 0.44–1.00)
GFR, Estimated: >60 mL/min (ref >60)
Glucose, Bld: 82 mg/dL (ref 70–99)
Potassium: 3.2 mmol/L — ABNORMAL LOW (ref 3.5–5.1)
Sodium: 135 mmol/L (ref 135–145)
Total Bilirubin: 0.6 mg/dL (ref 0.3–1.2)
Total Protein: 6.5 g/dL (ref 6.5–8.1)

## 2022-04-09 LAB — CBC WITH DIFFERENTIAL/PLATELET
Abs Immature Granulocytes: 0.02 10*3/uL (ref 0.00–0.07)
Basophils Absolute: 0 10*3/uL (ref 0.0–0.1)
Basophils Relative: 1 %
Eosinophils Absolute: 0 10*3/uL (ref 0.0–0.5)
Eosinophils Relative: 1 %
HCT: 31.9 % — ABNORMAL LOW (ref 36.0–46.0)
Hemoglobin: 10.6 g/dL — ABNORMAL LOW (ref 12.0–15.0)
Immature Granulocytes: 0 %
Lymphocytes Relative: 39 %
Lymphs Abs: 2.5 10*3/uL (ref 0.7–4.0)
MCH: 27.7 pg (ref 26.0–34.0)
MCHC: 33.2 g/dL (ref 30.0–36.0)
MCV: 83.5 fL (ref 80.0–100.0)
Monocytes Absolute: 0.4 10*3/uL (ref 0.1–1.0)
Monocytes Relative: 7 %
Neutro Abs: 3.4 10*3/uL (ref 1.7–7.7)
Neutrophils Relative %: 52 %
Platelets: 267 10*3/uL (ref 150–400)
RBC: 3.82 MIL/uL — ABNORMAL LOW (ref 3.87–5.11)
RDW: 13 % (ref 11.5–15.5)
WBC: 6.4 10*3/uL (ref 4.0–10.5)
nRBC: 0 % (ref 0.0–0.2)

## 2022-04-09 LAB — PROTEIN / CREATININE RATIO, URINE
Creatinine, Urine: 58 mg/dL
Total Protein, Urine: <6 mg/dL

## 2022-04-09 MED ORDER — MAGNESIUM OXIDE -MG SUPPLEMENT 400 (240 MG) MG PO TABS
400.0000 mg | ORAL_TABLET | Freq: Once | ORAL | Status: AC
  Administered 2022-04-09: 400 mg via ORAL
  Filled 2022-04-09: qty 1

## 2022-04-09 MED ORDER — METOCLOPRAMIDE HCL 10 MG PO TABS
10.0000 mg | ORAL_TABLET | Freq: Once | ORAL | Status: AC
  Administered 2022-04-09: 10 mg via ORAL
  Filled 2022-04-09: qty 1

## 2022-04-09 MED ORDER — BUTALBITAL-APAP-CAFFEINE 50-325-40 MG PO TABS
2.0000 | ORAL_TABLET | Freq: Once | ORAL | Status: AC
  Administered 2022-04-09: 2 via ORAL
  Filled 2022-04-09: qty 2

## 2022-04-09 NOTE — Patient Outreach (Addendum)
Medicaid Managed Care Social Work Note  04/09/2022 Name:  Grace [name] MRN:  [id number] DOB:  May 23, 2001  Grace [name] is an 21 y.o. year old female who is a primary patient of Bunnie Pion, FNP.  The [facility] Managed Care Coordination team was consulted for assistance with:   High Risk Pregnancy   Patient has [employer] and does not qualify for [facility] program like [place] LCSW initially thought. Patient updated. No referral made but community resources and suggestions were provided.   Engaged with patient  for by telephone in response to referral for case management and/or care coordination services.   Assessments/Interventions:  Review of past medical history, allergies, medications, health status, including review of consultants reports, laboratory and other test data, was performed as part of comprehensive evaluation and provision of chronic care management services.  Care Plan                 No Known Allergies  Medications Reviewed Today     Reviewed by Baruch Gouty, RN (Registered Nurse) on 04/08/22 at [place] List Status: <None>   Medication Order Taking? Sig Documenting Provider Last Dose Status Informant  acetaminophen (TYLENOL) 325 MG tablet [id number]  Take 2 tablets (650 mg total) by mouth every 4 (four) hours as needed (for pain scale < 4). Minda Meo, CNM  Active   albuterol (VENTOLIN HFA) 108 (90 Base) MCG/ACT inhaler [id number]  Inhale 2 puffs into the lungs every 6 (six) hours as needed for wheezing or shortness of breath. Annamaria Boots  Active   Blood Pressure KIT [id number]  1 kit by Does not apply route daily. Notify provider for blood pressures less than 90/50 or 160/110 or greater Minda Meo, CNM  Active   cetirizine (ZYRTEC) 10 MG tablet [id number]  Take 1 tablet (10 mg total) by mouth daily. Steele Sizer, MD  Active   loratadine (CLARITIN) 10 MG tablet [id number]  Take 1 tablet (10 mg total) by mouth daily. Radene Gunning, MD  Active    montelukast (SINGULAIR) 10 MG tablet [id number]  TAKE ONE TABLET BY MOUTH EVERYDAY AT BEDTIME Cletis Athens, MD  Active   ondansetron (ZOFRAN-ODT) 4 MG disintegrating tablet [id number] Yes Take 1 tablet (4 mg total) by mouth every 8 (eight) hours as needed for nausea or vomiting. Philip Aspen, CNM Taking Active   Prenatal MV & Min w/FA-DHA (PRENATAL GUMMIES PO) [id number] Yes Take by mouth. [provider] Taking Active            Med Note Rulon Eisenmenger, Harrison Mons   Tue Apr 08, 2022  9:45 AM) Inconsistent  SYMBICORT 160-4.5 MCG/ACT inhaler [id number]  SMARTSIG:2 Puff(s) By Mouth Twice Daily [provider]  Active   trimethoprim-polymyxin b (POLYTRIM) ophthalmic solution [id number]  Place 1 drop into the left eye every 6 (six) hours. Sharion Balloon, FNP  Active   Med List Note [name], [facility] 11/04/19 1505): Pt has been out of most meds for a few weeks            Patient Active Problem List   Diagnosis Date Noted   Elevated blood pressure affecting pregnancy in third trimester, antepartum 04/01/2022   Headache in pregnancy, antepartum, third trimester 04/01/2022   [redacted] weeks gestation of pregnancy 04/01/2022   Pelvic pressure in pregnancy 03/28/2022   Proteinuria affecting pregnancy in third trimester 02/08/2022   Supervision of other normal pregnancy, antepartum 10/07/2021   Mood disorder ([place]) 11/15/2018  PTSD (post-traumatic stress disorder) 11/15/2018   Major depression, recurrent, chronic ([place]) 08/06/2018   GAD (generalized anxiety disorder) 08/06/2018   Allergy to nuts 09/15/2016   Chronic allergic rhinitis 12/20/2015   Primary dysmenorrhea 12/20/2015    There are no care plans that you recently modified to display for this patient.  Eula Fried, BSW, MSW, [employer] Managed Medicaid LCSW [email or web address] Phone: [phone]

## 2022-04-10 ENCOUNTER — Other Ambulatory Visit: Payer: Self-pay | Admitting: Licensed Practical Nurse

## 2022-04-10 ENCOUNTER — Encounter: Payer: Self-pay | Admitting: Licensed Practical Nurse

## 2022-04-10 DIAGNOSIS — R519 Headache, unspecified: Secondary | ICD-10-CM

## 2022-04-10 DIAGNOSIS — Z3A34 34 weeks gestation of pregnancy: Secondary | ICD-10-CM

## 2022-04-10 DIAGNOSIS — O368330 Maternal care for abnormalities of the fetal heart rate or rhythm, third trimester, not applicable or unspecified: Secondary | ICD-10-CM

## 2022-04-10 DIAGNOSIS — O99891 Other specified diseases and conditions complicating pregnancy: Secondary | ICD-10-CM | POA: Diagnosis not present

## 2022-04-10 DIAGNOSIS — O26893 Other specified pregnancy related conditions, third trimester: Secondary | ICD-10-CM

## 2022-04-10 DIAGNOSIS — O133 Gestational [pregnancy-induced] hypertension without significant proteinuria, third trimester: Secondary | ICD-10-CM

## 2022-04-10 NOTE — Discharge Instructions (Signed)
For Headache prevention: Magnesium oxide 400mg  daily Riboflavin 400mg  daily Co enzyme Q 10 150mg  daily
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
## 2022-04-11 ENCOUNTER — Ambulatory Visit (INDEPENDENT_AMBULATORY_CARE_PROVIDER_SITE_OTHER): Payer: Medicaid Other | Admitting: Licensed Practical Nurse

## 2022-04-11 ENCOUNTER — Telehealth: Payer: Self-pay | Admitting: *Deleted

## 2022-04-11 ENCOUNTER — Encounter: Payer: Self-pay | Admitting: Licensed Practical Nurse

## 2022-04-11 ENCOUNTER — Other Ambulatory Visit (HOSPITAL_COMMUNITY)
Admission: RE | Admit: 2022-04-11 | Discharge: 2022-04-11 | Disposition: A | Discharge status: Home / Self Care | Payer: Medicaid Other | Source: Ambulatory Visit | Attending: Licensed Practical Nurse | Admitting: Licensed Practical Nurse

## 2022-04-11 VITALS — BP 140/86 | Wt 200.0 lb

## 2022-04-11 DIAGNOSIS — O133 Gestational [pregnancy-induced] hypertension without significant proteinuria, third trimester: Secondary | ICD-10-CM

## 2022-04-11 DIAGNOSIS — Z3685 Encounter for antenatal screening for Streptococcus B: Secondary | ICD-10-CM

## 2022-04-11 DIAGNOSIS — O0990 Supervision of high risk pregnancy, unspecified, unspecified trimester: Secondary | ICD-10-CM | POA: Diagnosis present

## 2022-04-11 DIAGNOSIS — Z113 Encounter for screening for infections with a predominantly sexual mode of transmission: Secondary | ICD-10-CM | POA: Insufficient documentation

## 2022-04-11 DIAGNOSIS — Z3A35 35 weeks gestation of pregnancy: Secondary | ICD-10-CM

## 2022-04-11 LAB — POCT URINALYSIS DIPSTICK OB
Bilirubin, UA: NEGATIVE
Blood, UA: NEGATIVE
Glucose, UA: NEGATIVE
Ketones, UA: NEGATIVE
Leukocytes, UA: NEGATIVE
Nitrite, UA: NEGATIVE
POC,PROTEIN,UA: NEGATIVE
Urobilinogen, UA: 0.2 E.U./dL
pH, UA: 7.5 (ref 5.0–8.0)

## 2022-04-11 MED ORDER — LABETALOL HCL 100 MG PO TABS: 100.0000 mg | ORAL_TABLET | Freq: Two times a day (BID) | ORAL | 0 refills | Status: DC

## 2022-04-11 NOTE — Progress Notes (Signed)
ROB- no concerns 

## 2022-04-11 NOTE — Patient Outreach (Signed)
Care Coordination  04/11/2022  SELYNA KLAHN 09-23-2000 [id number]  Transitional Care Management Follow-up Telephone Call  ACSA ESTEY was discharged from [facility] at [facility], has or will have a pregnancy risk assessment at next visit, has a scheduled follow up OB appointment with the [facility] provider on 04/11/22 date, and has been referred for appropriate case management and ongoing follow up.   Estanislado Emms RN, BSN [place]  [employer]

## 2022-04-11 NOTE — Progress Notes (Signed)
Routine Prenatal Care Visit  Subjective  Grace [name] is a 21 y.o. [phone] at [redacted]w[redacted]d being seen today for ongoing prenatal care.  She is currently monitored for the following issues for this high-risk pregnancy and has Chronic allergic rhinitis; Major depression, recurrent, chronic (HCC); GAD (generalized anxiety disorder); Mood disorder (HCC); PTSD (post-traumatic stress disorder); Allergy to nuts; Primary dysmenorrhea; Supervision of other normal pregnancy, antepartum; Proteinuria affecting pregnancy in third trimester; Pelvic pressure in pregnancy; Elevated blood pressure affecting pregnancy in third trimester, antepartum; Headache in pregnancy, antepartum, third trimester; [redacted] weeks gestation of pregnancy; and Headache in pregnancy on their problem list.  ----------------------------------------------------------------------------------- Patient reports continues to have Headaches, has been able to sleep (seen in triage this week for HA, labs WNL and was sent home) -BP elevated today, reports home BP are usually around 140's/90's, will start Labetalol today -Has irritation near her clitoris, recently shaved, has used hydrocortisone with good relief- area above clitoris pink -irritated looking consistent with yeast, will do Monistat 7  Contractions: Not present. Vag. Bleeding: None.  Movement: Present. Leaking Fluid denies.  ----------------------------------------------------------------------------------- The following portions of the patient's history were reviewed and updated as appropriate: allergies, current medications, past family history, past medical history, past social history, past surgical history and problem list. Problem list updated.  Objective  Blood pressure (!) 140/86, weight 200 lb (90.7 kg), last menstrual period 07/23/2021, unknown if currently breastfeeding. Pregravid weight 208 lb (94.3 kg) Total Weight Gain -8 lb (-3.629 kg) Urinalysis: Urine Protein Negative  Urine  Glucose Negative  Fetal Status:     Movement: Present     General:  Alert, oriented and cooperative. Patient is in no acute distress.  Skin: Skin is warm and dry. No rash noted.   Cardiovascular: Normal heart rate noted  Respiratory: Normal respiratory effort, no problems with respiration noted  Abdomen: Soft, gravid, appropriate for gestational age. Pain/Pressure: Present     Pelvic:  Cervical exam deferred        Extremities: Normal range of motion.     Mental Status: Normal mood and affect. Normal behavior. Normal judgment and thought content.   Assessment   21 y.o. [id number] at [redacted]w[redacted]d by  05/16/2022, by Ultrasound presenting for routine prenatal visit  Plan   February 2023 Problems (from 09/16/21 to present)     Problem Noted Resolved   Headache in pregnancy, antepartum, third trimester 04/01/2022 by Tresea Mall, CNM No   [redacted] weeks gestation of pregnancy 04/01/2022 by Tresea Mall, CNM No   Proteinuria affecting pregnancy in third trimester 02/08/2022 by Federico Flake, MD No   Overview Signed 02/08/2022  9:21 AM by Federico Flake, MD    6/13 UPC=371mg       Supervision of other normal pregnancy, antepartum 10/07/2021 by Ellwood Sayers, CNM No   Overview Addendum 01/08/2022 10:39 AM by Ellwood Sayers, CNM     Nursing Staff Provider  Office Location  [facility] Dating  [redacted]w[redacted]d on 2/7  Language  English  Anatomy US  Normal   Flu Vaccine   Genetic Screen  NIPS: neg, XX  TDaP vaccine    Hgb A1C or  GTT Early :5.3 Third trimester :   Covid    LAB RESULTS   Rhogam  NA Blood Type A/Positive/-- (02/24 1114)   Feeding Plan  Antibody Negative (02/24 1114)  Contraception  Rubella 2.52 (02/24 1114)  Circumcision NA RPR Non Reactive (02/24 1114)   Pediatrician  Gavin Potters [place]  HBsAg Negative (02/24 1114)  Support Person [place]  HIV Non Reactive (02/24 1114)  Prenatal Classes multip  Varicella Immune    GBS  (For PCN allergy, check sensitivities)   BTL Consent      VBAC Consent  Pap  ASC-US HPV pos     Hgb Electro    Pelvis Tested  CF      SMA                    Preterm labor symptoms and general obstetric precautions including but not limited to vaginal bleeding, contractions, leaking of fluid and fetal movement were reviewed in detail with the patient. Please refer to After Visit Summary for other counseling recommendations.   Return in about 1 week (around 04/18/2022) for ROB, NST labs.  MFM on Tuesday  GBS and aptima sent   IOL September 1 at 0800   Carie Caddy, CNM  Domingo Pulse, [facility]  04/11/22  12:25 PM

## 2022-04-14 ENCOUNTER — Encounter: Payer: Self-pay | Admitting: Obstetrics and Gynecology

## 2022-04-14 ENCOUNTER — Observation Stay
Admission: EM | Admit: 2022-04-14 | Discharge: 2022-04-14 | Disposition: A | Discharge status: Home / Self Care | Payer: Medicaid Other | Attending: Obstetrics and Gynecology | Admitting: Obstetrics and Gynecology

## 2022-04-14 ENCOUNTER — Encounter: Payer: Self-pay | Admitting: Licensed Practical Nurse

## 2022-04-14 DIAGNOSIS — Z3A35 35 weeks gestation of pregnancy: Secondary | ICD-10-CM

## 2022-04-14 DIAGNOSIS — J45909 Unspecified asthma, uncomplicated: Secondary | ICD-10-CM | POA: Insufficient documentation

## 2022-04-14 DIAGNOSIS — O99513 Diseases of the respiratory system complicating pregnancy, third trimester: Secondary | ICD-10-CM | POA: Diagnosis not present

## 2022-04-14 DIAGNOSIS — R519 Headache, unspecified: Secondary | ICD-10-CM | POA: Diagnosis not present

## 2022-04-14 DIAGNOSIS — O133 Gestational [pregnancy-induced] hypertension without significant proteinuria, third trimester: Secondary | ICD-10-CM | POA: Diagnosis not present

## 2022-04-14 DIAGNOSIS — Z3A33 33 weeks gestation of pregnancy: Secondary | ICD-10-CM

## 2022-04-14 DIAGNOSIS — Z3480 Encounter for supervision of other normal pregnancy, unspecified trimester: Secondary | ICD-10-CM

## 2022-04-14 DIAGNOSIS — Z87891 Personal history of nicotine dependence: Secondary | ICD-10-CM | POA: Insufficient documentation

## 2022-04-14 DIAGNOSIS — O99891 Other specified diseases and conditions complicating pregnancy: Secondary | ICD-10-CM | POA: Diagnosis present

## 2022-04-14 DIAGNOSIS — O1213 Gestational proteinuria, third trimester: Secondary | ICD-10-CM

## 2022-04-14 DIAGNOSIS — O26893 Other specified pregnancy related conditions, third trimester: Secondary | ICD-10-CM

## 2022-04-14 DIAGNOSIS — Z79899 Other long term (current) drug therapy: Secondary | ICD-10-CM | POA: Diagnosis not present

## 2022-04-14 DIAGNOSIS — O365930 Maternal care for other known or suspected poor fetal growth, third trimester, not applicable or unspecified: Secondary | ICD-10-CM

## 2022-04-14 LAB — COMPREHENSIVE METABOLIC PANEL
ALT: 6 U/L (ref 0–44)
AST: 12 U/L — ABNORMAL LOW (ref 15–41)
Albumin: 2.8 g/dL — ABNORMAL LOW (ref 3.5–5.0)
Alkaline Phosphatase: 149 U/L — ABNORMAL HIGH (ref 38–126)
Anion gap: 6 (ref 5–15)
BUN: <5 mg/dL — ABNORMAL LOW (ref 6–20)
CO2: 22 mmol/L (ref 22–32)
Calcium: 8.6 mg/dL — ABNORMAL LOW (ref 8.9–10.3)
Chloride: 107 mmol/L (ref 98–111)
Creatinine, Ser: 0.55 mg/dL (ref 0.44–1.00)
GFR, Estimated: >60 mL/min (ref >60)
Glucose, Bld: 89 mg/dL (ref 70–99)
Potassium: 3.2 mmol/L — ABNORMAL LOW (ref 3.5–5.1)
Sodium: 135 mmol/L (ref 135–145)
Total Bilirubin: 0.4 mg/dL (ref 0.3–1.2)
Total Protein: 6.3 g/dL — ABNORMAL LOW (ref 6.5–8.1)

## 2022-04-14 LAB — CBC
HCT: 31.4 % — ABNORMAL LOW (ref 36.0–46.0)
Hemoglobin: 10.4 g/dL — ABNORMAL LOW (ref 12.0–15.0)
MCH: 27.4 pg (ref 26.0–34.0)
MCHC: 33.1 g/dL (ref 30.0–36.0)
MCV: 82.8 fL (ref 80.0–100.0)
Platelets: 242 10*3/uL (ref 150–400)
RBC: 3.79 MIL/uL — ABNORMAL LOW (ref 3.87–5.11)
RDW: 12.9 % (ref 11.5–15.5)
WBC: 5.2 10*3/uL (ref 4.0–10.5)
nRBC: 0 % (ref 0.0–0.2)

## 2022-04-14 LAB — CERVICOVAGINAL ANCILLARY ONLY
Candida Glabrata: NEGATIVE
Candida Vaginitis: POSITIVE — AB
Chlamydia: NEGATIVE
Comment: NEGATIVE
Comment: NEGATIVE
Comment: NEGATIVE
Comment: NORMAL
Neisseria Gonorrhea: NEGATIVE

## 2022-04-14 LAB — PROTEIN / CREATININE RATIO, URINE
Creatinine, Urine: 109 mg/dL
Protein Creatinine Ratio: 0.1 mg/mg{Cre} (ref 0.00–0.15)
Total Protein, Urine: 11 mg/dL

## 2022-04-14 MED ORDER — LABETALOL HCL 100 MG PO TABS: 200.0000 mg | ORAL_TABLET | Freq: Two times a day (BID) | ORAL | Status: DC

## 2022-04-14 MED ORDER — ACETAMINOPHEN 325 MG PO TABS: 650.0000 mg | ORAL_TABLET | Freq: Once | ORAL | Status: DC

## 2022-04-14 MED ORDER — LABETALOL HCL 200 MG PO TABS: 200.0000 mg | ORAL_TABLET | Freq: Two times a day (BID) | ORAL | 0 refills | Status: DC

## 2022-04-14 MED ORDER — BUTALBITAL-APAP-CAFFEINE 50-325-40 MG PO TABS
1.0000 | ORAL_TABLET | Freq: Once | ORAL | Status: AC
  Administered 2022-04-14: 1 via ORAL
  Filled 2022-04-14: qty 1

## 2022-04-14 MED ORDER — LABETALOL HCL 100 MG PO TABS
100.0000 mg | ORAL_TABLET | Freq: Once | ORAL | Status: AC
  Administered 2022-04-14: 100 mg via ORAL
  Filled 2022-04-14: qty 1
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
## 2022-04-14 NOTE — OB Triage Note (Addendum)
Pt is a G4P1021 at 35.3 weeks being followed by MFM for increased BP and ongoing HA that has not been medication.  She is currently on 100 mg Labetalol that she took at 6 am.  BP at home was elevated 130-140/100-105 range taken several times from 6am -9am.    Had HA last pregnancy, has neuro referral.    C/o ha, no vision changes, +1 edema, Reflexes +2, no clonus, no RUQ pain.

## 2022-04-14 NOTE — Discharge Summary (Signed)
   Please see Final Progress Note.  Mirna Mires, CNM  04/14/2022 3:14 PM

## 2022-04-15 ENCOUNTER — Encounter: Payer: Medicaid Other | Admitting: Advanced Practice Midwife

## 2022-04-15 ENCOUNTER — Ambulatory Visit: Payer: Medicaid Other | Admitting: *Deleted

## 2022-04-15 ENCOUNTER — Encounter: Payer: Self-pay | Admitting: *Deleted

## 2022-04-15 ENCOUNTER — Encounter: Payer: Self-pay | Admitting: Licensed Practical Nurse

## 2022-04-15 ENCOUNTER — Telehealth: Payer: Self-pay | Admitting: *Deleted

## 2022-04-15 ENCOUNTER — Ambulatory Visit: Payer: Medicaid Other | Attending: Obstetrics

## 2022-04-15 VITALS — BP 137/90 | HR 77

## 2022-04-15 DIAGNOSIS — Z3A35 35 weeks gestation of pregnancy: Secondary | ICD-10-CM | POA: Diagnosis not present

## 2022-04-15 DIAGNOSIS — O139 Gestational [pregnancy-induced] hypertension without significant proteinuria, unspecified trimester: Secondary | ICD-10-CM

## 2022-04-15 DIAGNOSIS — R519 Headache, unspecified: Secondary | ICD-10-CM | POA: Insufficient documentation

## 2022-04-15 DIAGNOSIS — O365930 Maternal care for other known or suspected poor fetal growth, third trimester, not applicable or unspecified: Secondary | ICD-10-CM | POA: Diagnosis present

## 2022-04-15 DIAGNOSIS — Z3A33 33 weeks gestation of pregnancy: Secondary | ICD-10-CM

## 2022-04-15 DIAGNOSIS — Z3480 Encounter for supervision of other normal pregnancy, unspecified trimester: Secondary | ICD-10-CM | POA: Insufficient documentation

## 2022-04-15 DIAGNOSIS — O26893 Other specified pregnancy related conditions, third trimester: Secondary | ICD-10-CM

## 2022-04-15 DIAGNOSIS — Z363 Encounter for antenatal screening for malformations: Secondary | ICD-10-CM | POA: Insufficient documentation

## 2022-04-15 DIAGNOSIS — O365990 Maternal care for other known or suspected poor fetal growth, unspecified trimester, not applicable or unspecified: Secondary | ICD-10-CM

## 2022-04-15 DIAGNOSIS — O133 Gestational [pregnancy-induced] hypertension without significant proteinuria, third trimester: Secondary | ICD-10-CM | POA: Insufficient documentation

## 2022-04-15 DIAGNOSIS — O1213 Gestational proteinuria, third trimester: Secondary | ICD-10-CM

## 2022-04-15 DIAGNOSIS — O163 Unspecified maternal hypertension, third trimester: Secondary | ICD-10-CM | POA: Insufficient documentation

## 2022-04-15 LAB — CULTURE, BETA STREP (GROUP B ONLY): Strep Gp B Culture: NEGATIVE

## 2022-04-15 NOTE — Patient Outreach (Signed)
Care Coordination  04/15/2022  Grace [name] 30-Dec-2000 [id number]  Transitional Care Management Follow-up Telephone Call  Grace [name] was discharged from [facility] at [facility], has or will have a pregnancy risk assessment at next visit, has a scheduled follow up OB appointment with the [facility] provider on 04/15/22 and 04/17/22, and has been referred for appropriate case management and ongoing follow up.   Estanislado Emms RN, BSN [place]  [employer]

## 2022-04-17 ENCOUNTER — Encounter: Payer: Self-pay | Admitting: Obstetrics

## 2022-04-17 ENCOUNTER — Other Ambulatory Visit: Payer: Medicaid Other

## 2022-04-17 ENCOUNTER — Ambulatory Visit (INDEPENDENT_AMBULATORY_CARE_PROVIDER_SITE_OTHER): Payer: Medicaid Other | Admitting: Obstetrics

## 2022-04-17 VITALS — BP 132/88 | Wt 201.0 lb

## 2022-04-17 DIAGNOSIS — F339 Major depressive disorder, recurrent, unspecified: Secondary | ICD-10-CM

## 2022-04-17 DIAGNOSIS — F411 Generalized anxiety disorder: Secondary | ICD-10-CM

## 2022-04-17 DIAGNOSIS — Z3A35 35 weeks gestation of pregnancy: Secondary | ICD-10-CM | POA: Diagnosis not present

## 2022-04-17 DIAGNOSIS — O163 Unspecified maternal hypertension, third trimester: Secondary | ICD-10-CM

## 2022-04-17 DIAGNOSIS — Z3480 Encounter for supervision of other normal pregnancy, unspecified trimester: Secondary | ICD-10-CM

## 2022-04-17 DIAGNOSIS — O99343 Other mental disorders complicating pregnancy, third trimester: Secondary | ICD-10-CM | POA: Diagnosis not present

## 2022-04-17 DIAGNOSIS — R519 Headache, unspecified: Secondary | ICD-10-CM

## 2022-04-17 LAB — POCT URINALYSIS DIPSTICK OB
Appearance: NORMAL
Bilirubin, UA: NEGATIVE
Blood, UA: NEGATIVE
Glucose, UA: NEGATIVE
Ketones, UA: NEGATIVE
Leukocytes, UA: NEGATIVE
Nitrite, UA: NEGATIVE
Odor: NORMAL
Spec Grav, UA: 1.015 (ref 1.010–1.025)
Urobilinogen, UA: 0.2 E.U./dL
pH, UA: 6 (ref 5.0–8.0)

## 2022-04-17 MED ORDER — BUTALBITAL-APAP-CAFFEINE 50-325-40 MG PO CAPS: 1.0000 | ORAL_CAPSULE | Freq: Four times a day (QID) | ORAL | 0 refills | Status: DC | PRN

## 2022-04-17 NOTE — Progress Notes (Signed)
Routine Prenatal Care Visit  Subjective  Grace [name] is a 21 y.o. [phone] at [redacted]w[redacted]d being seen today for ongoing prenatal care.  She is currently monitored for the following issues for this high-risk pregnancy and has Chronic allergic rhinitis; Major depression, recurrent, chronic (HCC); GAD (generalized anxiety disorder); Mood disorder (HCC); PTSD (post-traumatic stress disorder); Allergy to nuts; Primary dysmenorrhea; Supervision of other normal pregnancy, antepartum; Proteinuria affecting pregnancy in third trimester; Pelvic pressure in pregnancy; Elevated blood pressure affecting pregnancy in third trimester, antepartum; Headache in pregnancy, antepartum, third trimester; [redacted] weeks gestation of pregnancy; and Headache in pregnancy on their problem list.  ----------------------------------------------------------------------------------- Patient reports headache, no bleeding, no contractions, no cramping and no leaking. She has visited the unit triage several times this month for headaches. Was offered Percocet, but did not use. This past week she was given Fioricet. She reports she will give this a try. Caffeine has not seemed to help her. Very eager to reach her IOL date (04/25/2022 at 0800). Some increased pelvic pressure. Contractions: Irritability. Vag. Bleeding: None.  Movement: Present. Leaking Fluid denies.  ----------------------------------------------------------------------------------- The following portions of the patient's history were reviewed and updated as appropriate: allergies, current medications, past family history, past medical history, past social history, past surgical history and problem list. Problem list updated.  Objective  Blood pressure 132/88, weight 201 lb (91.2 kg), last menstrual period 07/23/2021, unknown if currently breastfeeding. Pregravid weight 208 lb (94.3 kg) Total Weight Gain -7 lb (-3.175 kg) Urinalysis: Urine Protein Trace  Urine Glucose Negative  Fetal  Status:     Movement: Present     General:  Alert, oriented and cooperative. Patient is in no acute distress.  Skin: Skin is warm and dry. No rash noted.   Cardiovascular: Normal heart rate noted  Respiratory: Normal respiratory effort, no problems with respiration noted  Abdomen: Soft, gravid, appropriate for gestational age. Pain/Pressure: Present     Pelvic:  checked per her equest: closed/50%/ballotable        Extremities: Normal range of motion.     Mental Status: Normal mood and affect. Normal behavior. Normal judgment and thought content.   Assessment   21 y.o. [id number] at [redacted]w[redacted]d by  05/16/2022, by Ultrasound presenting for routine prenatal visit  Plan   February 2023 Problems (from 09/16/21 to present)    Problem Noted Resolved   Elevated blood pressure affecting pregnancy in third trimester, antepartum 04/01/2022 by Tresea Mall, CNM No   Overview Signed 04/14/2022  3:21 PM by Mirna Mires, CNM    Placed on labetalol 100 mg po Bid. On 04/14/2022 at a visit to the [facility] triage with c/o headache and higher Bps, her dosage of Labetalol was increased to 200 mg po BID.      Headache in pregnancy, antepartum, third trimester 04/01/2022 by Tresea Mall, CNM No   [redacted] weeks gestation of pregnancy 04/01/2022 by Tresea Mall, CNM No   Proteinuria affecting pregnancy in third trimester 02/08/2022 by Federico Flake, MD No   Overview Signed 02/08/2022  9:21 AM by Federico Flake, MD    6/13 UPC=371mg       Supervision of other normal pregnancy, antepartum 10/07/2021 by Ellwood Sayers, CNM No   Overview Addendum 04/17/2022  9:53 AM by Mirna Mires, CNM     Nursing Staff Provider  Office Location  [facility] Dating  [redacted]w[redacted]d on 2/7  Language  English  Anatomy US  Normal   Flu Vaccine   Genetic Screen  NIPS: neg,  XX  TDaP vaccine    Hgb A1C or  GTT Early :5.3 Third trimester :   Covid    LAB RESULTS   Rhogam  NA Blood Type A/Positive/-- (02/24 1114)   Feeding Plan   Antibody Negative (02/24 1114)  Contraception  Rubella 2.52 (02/24 1114)  Circumcision NA RPR Non Reactive (02/24 1114)   Pediatrician  Gwenlyn Saran  HBsAg Negative (02/24 1114)   Support Person [place]  HIV Non Reactive (02/24 1114)  Prenatal Classes multip  Varicella Immune    GBS  (For PCN allergy, check sensitivities) negative  BTL Consent     VBAC Consent  Pap  ASC-US HPV pos     Hgb Electro    Pelvis Tested  CF      SMA                   Preterm labor symptoms and general obstetric precautions including but not limited to vaginal bleeding, contractions, leaking of fluid and fetal movement were reviewed in detail with the patient. Please refer to After Visit Summary for other counseling recommendations.  Reactive NST today.Rx for limited Fioricet provided today. Return in about 5 days (around 04/22/2022) for return OB, BP check and NST.  Mirna Mires, CNM  04/17/2022 9:54 AM

## 2022-04-19 ENCOUNTER — Observation Stay
Admission: EM | Admit: 2022-04-19 | Discharge: 2022-04-19 | Disposition: A | Discharge status: Home / Self Care | Payer: Medicaid Other | Attending: Obstetrics and Gynecology | Admitting: Obstetrics and Gynecology

## 2022-04-19 ENCOUNTER — Other Ambulatory Visit: Payer: Self-pay

## 2022-04-19 ENCOUNTER — Encounter: Payer: Self-pay | Admitting: Advanced Practice Midwife

## 2022-04-19 ENCOUNTER — Other Ambulatory Visit: Payer: Self-pay | Admitting: Licensed Practical Nurse

## 2022-04-19 DIAGNOSIS — O1213 Gestational proteinuria, third trimester: Secondary | ICD-10-CM

## 2022-04-19 DIAGNOSIS — Z7951 Long term (current) use of inhaled steroids: Secondary | ICD-10-CM | POA: Insufficient documentation

## 2022-04-19 DIAGNOSIS — O99513 Diseases of the respiratory system complicating pregnancy, third trimester: Secondary | ICD-10-CM | POA: Insufficient documentation

## 2022-04-19 DIAGNOSIS — Z3A36 36 weeks gestation of pregnancy: Secondary | ICD-10-CM | POA: Insufficient documentation

## 2022-04-19 DIAGNOSIS — Z79899 Other long term (current) drug therapy: Secondary | ICD-10-CM | POA: Diagnosis not present

## 2022-04-19 DIAGNOSIS — O26893 Other specified pregnancy related conditions, third trimester: Secondary | ICD-10-CM

## 2022-04-19 DIAGNOSIS — J45909 Unspecified asthma, uncomplicated: Secondary | ICD-10-CM | POA: Diagnosis not present

## 2022-04-19 DIAGNOSIS — O365910 Maternal care for other known or suspected poor fetal growth, first trimester, not applicable or unspecified: Secondary | ICD-10-CM

## 2022-04-19 DIAGNOSIS — O365930 Maternal care for other known or suspected poor fetal growth, third trimester, not applicable or unspecified: Secondary | ICD-10-CM | POA: Diagnosis not present

## 2022-04-19 DIAGNOSIS — O163 Unspecified maternal hypertension, third trimester: Secondary | ICD-10-CM | POA: Diagnosis present

## 2022-04-19 DIAGNOSIS — Z87891 Personal history of nicotine dependence: Secondary | ICD-10-CM | POA: Insufficient documentation

## 2022-04-19 DIAGNOSIS — O133 Gestational [pregnancy-induced] hypertension without significant proteinuria, third trimester: Secondary | ICD-10-CM | POA: Diagnosis not present

## 2022-04-19 DIAGNOSIS — Z3490 Encounter for supervision of normal pregnancy, unspecified, unspecified trimester: Secondary | ICD-10-CM

## 2022-04-19 DIAGNOSIS — R519 Headache, unspecified: Secondary | ICD-10-CM

## 2022-04-19 DIAGNOSIS — Z3480 Encounter for supervision of other normal pregnancy, unspecified trimester: Secondary | ICD-10-CM

## 2022-04-19 DIAGNOSIS — Z3A33 33 weeks gestation of pregnancy: Secondary | ICD-10-CM

## 2022-04-19 LAB — CBC
HCT: 31.3 % — ABNORMAL LOW (ref 36.0–46.0)
Hemoglobin: 10.4 g/dL — ABNORMAL LOW (ref 12.0–15.0)
MCH: 27.2 pg (ref 26.0–34.0)
MCHC: 33.2 g/dL (ref 30.0–36.0)
MCV: 81.7 fL (ref 80.0–100.0)
Platelets: 279 10*3/uL (ref 150–400)
RBC: 3.83 MIL/uL — ABNORMAL LOW (ref 3.87–5.11)
RDW: 13.1 % (ref 11.5–15.5)
WBC: 5.4 10*3/uL (ref 4.0–10.5)
nRBC: 0 % (ref 0.0–0.2)

## 2022-04-19 LAB — COMPREHENSIVE METABOLIC PANEL
ALT: 5 U/L (ref 0–44)
AST: 13 U/L — ABNORMAL LOW (ref 15–41)
Albumin: 2.9 g/dL — ABNORMAL LOW (ref 3.5–5.0)
Alkaline Phosphatase: 155 U/L — ABNORMAL HIGH (ref 38–126)
Anion gap: 8 (ref 5–15)
BUN: <5 mg/dL — ABNORMAL LOW (ref 6–20)
CO2: 21 mmol/L — ABNORMAL LOW (ref 22–32)
Calcium: 8.6 mg/dL — ABNORMAL LOW (ref 8.9–10.3)
Chloride: 108 mmol/L (ref 98–111)
Creatinine, Ser: 0.55 mg/dL (ref 0.44–1.00)
GFR, Estimated: >60 mL/min (ref >60)
Glucose, Bld: 87 mg/dL (ref 70–99)
Potassium: 3.7 mmol/L (ref 3.5–5.1)
Sodium: 137 mmol/L (ref 135–145)
Total Bilirubin: 0.7 mg/dL (ref 0.3–1.2)
Total Protein: 6.3 g/dL — ABNORMAL LOW (ref 6.5–8.1)

## 2022-04-19 LAB — PROTEIN / CREATININE RATIO, URINE
Creatinine, Urine: 35 mg/dL
Total Protein, Urine: <6 mg/dL

## 2022-04-19 MED ORDER — LABETALOL HCL 200 MG PO TABS: 300.0000 mg | ORAL_TABLET | Freq: Two times a day (BID) | ORAL | 1 refills | Status: DC

## 2022-04-19 MED ORDER — BUTALBITAL-APAP-CAFFEINE 50-325-40 MG PO TABS
2.0000 | ORAL_TABLET | Freq: Once | ORAL | Status: AC | PRN
Start: 2022-04-19 — End: 2022-04-19
  Administered 2022-04-19: 2 via ORAL
  Filled 2022-04-19: qty 2
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
## 2022-04-19 NOTE — Progress Notes (Signed)
IOL on 9/1, admission orders placed Carie Caddy, CNM  Domingo Pulse, [facility]  04/19/22  1:32 AM

## 2022-04-19 NOTE — OB Triage Note (Signed)
Pt co ongoing HA. Has not picked up Fioricet from the pharmacy. Denies visual disturbances, epigastric pain. Not hyperreflexic. Tresea Mall, CNM at bedside. Elaina Hoops

## 2022-04-19 NOTE — OB Triage Note (Signed)
Patient discharged home per order.  She is stable and ambulatory. An After Visit Summary was printed and given to the patient. Discharge education completed with patient including follow up instructions, appointments, and medication list. She received labor and bleeding precautions. Patient able to verbalize understanding. All questions fully answered upon discharge. Patient instructed to return to ED, call 911, or call provider for any changes in condition. Patient discharged home via personal vehicle withall belongings.    

## 2022-04-21 ENCOUNTER — Telehealth: Payer: Self-pay | Admitting: *Deleted

## 2022-04-21 NOTE — Patient Outreach (Signed)
Care Coordination  04/21/2022  Grace [name] [birth year] [id number]  Transitional Care Management Follow-up Telephone Call  [name] was discharged from Other: [facility] , has or will have a pregnancy risk assessment at next visit, has a scheduled follow up OB appointment with the [facility] provider on 04/22/22, and has been referred for appropriate case management and ongoing follow up.   Estanislado Emms RN, BSN [place]  [employer]

## 2022-04-22 ENCOUNTER — Ambulatory Visit (INDEPENDENT_AMBULATORY_CARE_PROVIDER_SITE_OTHER): Payer: Medicaid Other | Admitting: Obstetrics & Gynecology

## 2022-04-22 ENCOUNTER — Other Ambulatory Visit: Payer: Medicaid Other

## 2022-04-22 ENCOUNTER — Other Ambulatory Visit: Payer: Self-pay

## 2022-04-22 ENCOUNTER — Encounter: Payer: Self-pay | Admitting: Obstetrics and Gynecology

## 2022-04-22 ENCOUNTER — Observation Stay (HOSPITAL_BASED_OUTPATIENT_CLINIC_OR_DEPARTMENT_OTHER)
Admission: EM | Admit: 2022-04-22 | Discharge: 2022-04-22 | Disposition: A | Discharge status: Home / Self Care | Payer: Medicaid Other | Source: Home / Self Care | Admitting: Certified Nurse Midwife

## 2022-04-22 VITALS — BP 148/84 | Wt 202.0 lb

## 2022-04-22 DIAGNOSIS — O163 Unspecified maternal hypertension, third trimester: Secondary | ICD-10-CM

## 2022-04-22 DIAGNOSIS — Z3480 Encounter for supervision of other normal pregnancy, unspecified trimester: Secondary | ICD-10-CM

## 2022-04-22 DIAGNOSIS — Z79899 Other long term (current) drug therapy: Secondary | ICD-10-CM | POA: Insufficient documentation

## 2022-04-22 DIAGNOSIS — Z3A36 36 weeks gestation of pregnancy: Secondary | ICD-10-CM | POA: Insufficient documentation

## 2022-04-22 DIAGNOSIS — Z3A33 33 weeks gestation of pregnancy: Secondary | ICD-10-CM

## 2022-04-22 DIAGNOSIS — O368330 Maternal care for abnormalities of the fetal heart rate or rhythm, third trimester, not applicable or unspecified: Secondary | ICD-10-CM | POA: Insufficient documentation

## 2022-04-22 DIAGNOSIS — R519 Headache, unspecified: Secondary | ICD-10-CM

## 2022-04-22 DIAGNOSIS — O1213 Gestational proteinuria, third trimester: Secondary | ICD-10-CM

## 2022-04-22 DIAGNOSIS — O288 Other abnormal findings on antenatal screening of mother: Secondary | ICD-10-CM | POA: Diagnosis not present

## 2022-04-22 LAB — POCT URINALYSIS DIPSTICK OB
Appearance: NORMAL
Bilirubin, UA: NEGATIVE
Blood, UA: NEGATIVE
Glucose, UA: NEGATIVE
Ketones, UA: NEGATIVE
Nitrite, UA: NEGATIVE
Odor: NORMAL
Spec Grav, UA: 1.01 (ref 1.010–1.025)
Urobilinogen, UA: 0.2 E.U./dL
pH, UA: 6 (ref 5.0–8.0)

## 2022-04-22 NOTE — OB Triage Note (Signed)
    L&D OB Triage Note  SUBJECTIVE KEELEIGH [name] is a 21 y.o. [phone] female at [redacted]w[redacted]d, EDD Estimated Date of Delivery: 05/16/22 who presented to triage with complaints of non reactive NST in the office.   OB History  Gravida Para Term Preterm AB Living  $Remov'4 1 1 'ldTeMc$ 0 2 1  SAB IAB Ectopic Multiple Live Births  2 0 0 0 1    # Outcome Date GA Lbr Len/2nd Weight Sex Delivery Anes PTL Lv  4 Current           3 Term 07/26/20 [redacted]w[redacted]d / 00:48 2750 g M Vag-Spont EPI  LIV     Name: Ungerer,BOY Megann     Apgar1: 8  Apgar5: 9  2 SAB           1 SAB             Medications Prior to Admission  Medication Sig Dispense Refill Last Dose   albuterol (VENTOLIN HFA) 108 (90 Base) MCG/ACT inhaler Inhale 2 puffs into the lungs every 6 (six) hours as needed for wheezing or shortness of breath. 8 g 0    Blood Pressure KIT 1 kit by Does not apply route daily. Notify provider for blood pressures less than 90/50 or 160/110 or greater 1 kit 0    Butalbital-APAP-Caffeine 50-325-40 MG capsule Take 1-2 capsules by mouth every 6 (six) hours as needed for headache. 10 capsule 0    labetalol (NORMODYNE) 200 MG tablet Take 1.5 tablets (300 mg total) by mouth 2 (two) times daily. 60 tablet 1    montelukast (SINGULAIR) 10 MG tablet TAKE ONE TABLET BY MOUTH EVERYDAY AT BEDTIME 90 tablet 1    ondansetron (ZOFRAN-ODT) 4 MG disintegrating tablet Take 1 tablet (4 mg total) by mouth every 8 (eight) hours as needed for nausea or vomiting. 20 tablet 0    Prenatal MV & Min w/FA-DHA (PRENATAL GUMMIES PO) Take by mouth.      SYMBICORT 160-4.5 MCG/ACT inhaler SMARTSIG:2 Puff(s) By Mouth Twice Daily      trimethoprim-polymyxin b (POLYTRIM) ophthalmic solution Place 1 drop into the left eye every 6 (six) hours. 10 mL 0      OBJECTIVE  Nursing Evaluation:   BP (!) 143/88 (BP Location: Right Arm)   Pulse 96   Temp 98.3 F (36.8 C) (Oral)   Resp 15   Ht $R'5\' 6"'Pn$  (1.676 m)   Wt 92.1 kg   LMP 07/23/2021 (Exact Date)   BMI 32.77 kg/m     Findings:   Reactive NST       NST was performed and has been reviewed by me.  NST INTERPRETATION: Category I  Mode: External Baseline Rate (A): 130 bpm (fht)  Moderate variability Accelerations present Decelerations absent  Ctx: absent    ASSESSMENT Impression:  1.  Pregnancy:  [id number] at [redacted]w[redacted]d , EDD Estimated Date of Delivery: 05/16/22 2.  Reassuring fetal and maternal status  PLAN 1. Current condition and above findings reviewed.  Reassuring fetal and maternal condition. 2. Discharge home with standard labor precautions given to return to L&D or call the office for problems. 3. Continue routine prenatal care.  Philip Aspen, CNM

## 2022-04-22 NOTE — OB Triage Note (Signed)
Patient is a [id number] at [redacted]w[redacted]d who was sent over from clinic for nonreactive NST. Patient reports +FM, denies LOF, ctx, and vaginal bleeding. Initial FHT 130s. External monitors applied and assessing. Vital signs WDL.

## 2022-04-22 NOTE — Progress Notes (Signed)
Subjective:    Grace [name] is a 21 y.o. female being seen today for her obstetrical visit. She is at [redacted]w[redacted]d gestation. Patient reports no complaints. Fetal movement: normal.  Menstrual History: OB History     Gravida  4   Para  1   Term  1   Preterm  0   AB  2   Living  1      SAB  2   IAB  0   Ectopic  0   Multiple  0   Live Births  1           Menarche age: 25 Patient's last menstrual period was 07/23/2021 (exact date).    The following portions of the patient's history were reviewed and updated as appropriate: allergies, current medications, past family history, past medical history, past social history, past surgical history, and problem list.  Review of Systems A comprehensive review of systems was negative.   Objective:    BP (!) 148/84   Wt 202 lb (91.6 kg)   LMP 07/23/2021 (Exact Date)   BMI 32.60 kg/m  FHT:  148 BPM  Uterine Size: 37 cm  Presentation: cephalic     Assessment:    Pregnancy 36 and 4/7 weeks   Plan:    28-week labs reviewed, normal NR NST Follow up in to L&D for prolonged monitoring for NR NST.    Grace [name] 2001/03/24 [redacted]w[redacted]d   Non-Stress Test Interpretation for 04/22/22  36  4/7 wks  Indication:  IUGR and gestational htn w/o proteinuria     NR NST W several variables, concerning for oligohydramnios Sent to L&D for prolonged monitoring Discussed with patient and questions answered B/P elevated to 148/84   Linzie Collin, MD  05/29/2022 10:37 AM

## 2022-04-22 NOTE — OB Triage Note (Signed)
Discharge instructions, labor precautions, and follow-up care reviewed with patient. All questions answered. Patient verbalized understanding. Discharged ambulatory off unit.   

## 2022-04-23 ENCOUNTER — Encounter: Payer: Self-pay | Admitting: Obstetrics and Gynecology

## 2022-04-23 ENCOUNTER — Ambulatory Visit: Payer: Medicaid Other

## 2022-04-23 ENCOUNTER — Ambulatory Visit: Payer: Medicaid Other | Attending: Obstetrics

## 2022-04-23 ENCOUNTER — Other Ambulatory Visit: Payer: Self-pay

## 2022-04-23 ENCOUNTER — Ambulatory Visit: Payer: Medicaid Other | Admitting: *Deleted

## 2022-04-23 ENCOUNTER — Inpatient Hospital Stay
Admission: EM | Admit: 2022-04-23 | Discharge: 2022-04-26 | DRG: 807 | Disposition: A | Discharge status: Home / Self Care | Payer: Medicaid Other | Attending: Licensed Practical Nurse | Admitting: Licensed Practical Nurse

## 2022-04-23 VITALS — BP 148/102 | HR 96

## 2022-04-23 DIAGNOSIS — Z3A36 36 weeks gestation of pregnancy: Secondary | ICD-10-CM

## 2022-04-23 DIAGNOSIS — Z87891 Personal history of nicotine dependence: Secondary | ICD-10-CM

## 2022-04-23 DIAGNOSIS — O365930 Maternal care for other known or suspected poor fetal growth, third trimester, not applicable or unspecified: Secondary | ICD-10-CM | POA: Diagnosis present

## 2022-04-23 DIAGNOSIS — O163 Unspecified maternal hypertension, third trimester: Secondary | ICD-10-CM

## 2022-04-23 DIAGNOSIS — Z3490 Encounter for supervision of normal pregnancy, unspecified, unspecified trimester: Secondary | ICD-10-CM

## 2022-04-23 DIAGNOSIS — O139 Gestational [pregnancy-induced] hypertension without significant proteinuria, unspecified trimester: Secondary | ICD-10-CM

## 2022-04-23 DIAGNOSIS — O133 Gestational [pregnancy-induced] hypertension without significant proteinuria, third trimester: Secondary | ICD-10-CM | POA: Diagnosis not present

## 2022-04-23 DIAGNOSIS — O134 Gestational [pregnancy-induced] hypertension without significant proteinuria, complicating childbirth: Principal | ICD-10-CM | POA: Diagnosis present

## 2022-04-23 DIAGNOSIS — O365990 Maternal care for other known or suspected poor fetal growth, unspecified trimester, not applicable or unspecified: Secondary | ICD-10-CM | POA: Diagnosis present

## 2022-04-23 DIAGNOSIS — R519 Headache, unspecified: Secondary | ICD-10-CM

## 2022-04-23 DIAGNOSIS — Z3042 Encounter for surveillance of injectable contraceptive: Secondary | ICD-10-CM | POA: Diagnosis present

## 2022-04-23 DIAGNOSIS — R030 Elevated blood-pressure reading, without diagnosis of hypertension: Secondary | ICD-10-CM | POA: Diagnosis present

## 2022-04-23 DIAGNOSIS — O26893 Other specified pregnancy related conditions, third trimester: Principal | ICD-10-CM

## 2022-04-23 DIAGNOSIS — O1213 Gestational proteinuria, third trimester: Secondary | ICD-10-CM

## 2022-04-23 DIAGNOSIS — Z3A33 33 weeks gestation of pregnancy: Secondary | ICD-10-CM

## 2022-04-23 DIAGNOSIS — O99344 Other mental disorders complicating childbirth: Secondary | ICD-10-CM | POA: Diagnosis not present

## 2022-04-23 DIAGNOSIS — Z3480 Encounter for supervision of other normal pregnancy, unspecified trimester: Secondary | ICD-10-CM

## 2022-04-23 DIAGNOSIS — F32A Depression, unspecified: Secondary | ICD-10-CM | POA: Diagnosis not present

## 2022-04-23 HISTORY — DX: Gestational (pregnancy-induced) hypertension without significant proteinuria, unspecified trimester: O13.9

## 2022-04-23 LAB — CBC
HCT: 32.1 % — ABNORMAL LOW (ref 36.0–46.0)
Hemoglobin: 10.3 g/dL — ABNORMAL LOW (ref 12.0–15.0)
MCH: 26.9 pg (ref 26.0–34.0)
MCHC: 32.1 g/dL (ref 30.0–36.0)
MCV: 83.8 fL (ref 80.0–100.0)
Platelets: 262 10*3/uL (ref 150–400)
RBC: 3.83 MIL/uL — ABNORMAL LOW (ref 3.87–5.11)
RDW: 13.2 % (ref 11.5–15.5)
WBC: 5.4 10*3/uL (ref 4.0–10.5)
nRBC: 0 % (ref 0.0–0.2)

## 2022-04-23 LAB — PROTEIN / CREATININE RATIO, URINE
Creatinine, Urine: 99 mg/dL
Protein Creatinine Ratio: 0.11 mg/mg{Cre} (ref 0.00–0.15)
Total Protein, Urine: 11 mg/dL

## 2022-04-23 LAB — COMPREHENSIVE METABOLIC PANEL
ALT: 6 U/L (ref 0–44)
AST: 15 U/L (ref 15–41)
Albumin: 2.9 g/dL — ABNORMAL LOW (ref 3.5–5.0)
Alkaline Phosphatase: 159 U/L — ABNORMAL HIGH (ref 38–126)
Anion gap: 5 (ref 5–15)
BUN: <5 mg/dL — ABNORMAL LOW (ref 6–20)
CO2: 23 mmol/L (ref 22–32)
Calcium: 8.6 mg/dL — ABNORMAL LOW (ref 8.9–10.3)
Chloride: 107 mmol/L (ref 98–111)
Creatinine, Ser: 0.54 mg/dL (ref 0.44–1.00)
GFR, Estimated: >60 mL/min (ref >60)
Glucose, Bld: 88 mg/dL (ref 70–99)
Potassium: 3.5 mmol/L (ref 3.5–5.1)
Sodium: 135 mmol/L (ref 135–145)
Total Bilirubin: 0.5 mg/dL (ref 0.3–1.2)
Total Protein: 6.4 g/dL — ABNORMAL LOW (ref 6.5–8.1)

## 2022-04-23 LAB — TYPE AND SCREEN
ABO/RH(D): A POS
Antibody Screen: NEGATIVE

## 2022-04-23 MED ORDER — OXYTOCIN BOLUS FROM INFUSION
333.0000 mL | Freq: Once | INTRAVENOUS | Status: AC
  Administered 2022-04-24: 333 mL via INTRAVENOUS

## 2022-04-23 MED ORDER — ONDANSETRON HCL 4 MG/2ML IJ SOLN: 4.0000 mg | Freq: Four times a day (QID) | INTRAMUSCULAR | Status: DC | PRN

## 2022-04-23 MED ORDER — LIDOCAINE HCL (PF) 1 % IJ SOLN
INTRAMUSCULAR | Status: AC
  Filled 2022-04-23: qty 30

## 2022-04-23 MED ORDER — OXYTOCIN-SODIUM CHLORIDE 30-0.9 UT/500ML-% IV SOLN
INTRAVENOUS | Status: AC
  Filled 2022-04-23: qty 500

## 2022-04-23 MED ORDER — MISOPROSTOL 200 MCG PO TABS
ORAL_TABLET | ORAL | Status: AC
  Filled 2022-04-23: qty 4

## 2022-04-23 MED ORDER — SOD CITRATE-CITRIC ACID 500-334 MG/5ML PO SOLN: 30.0000 mL | ORAL | Status: DC | PRN

## 2022-04-23 MED ORDER — LABETALOL HCL 100 MG PO TABS
ORAL_TABLET | ORAL | Status: AC
  Administered 2022-04-23: 300 mg via ORAL
  Filled 2022-04-23: qty 3

## 2022-04-23 MED ORDER — MISOPROSTOL 50MCG HALF TABLET
50.0000 ug | ORAL_TABLET | Freq: Once | ORAL | Status: AC
Start: 2022-04-23 — End: 2022-04-23
  Administered 2022-04-23: 50 ug via ORAL

## 2022-04-23 MED ORDER — LIDOCAINE HCL (PF) 1 % IJ SOLN: 30.0000 mL | INTRAMUSCULAR | Status: DC | PRN

## 2022-04-23 MED ORDER — AMMONIA AROMATIC IN INHA
RESPIRATORY_TRACT | Status: AC
  Filled 2022-04-23: qty 10

## 2022-04-23 MED ORDER — MISOPROSTOL 50MCG HALF TABLET
ORAL_TABLET | ORAL | Status: AC
  Filled 2022-04-23: qty 1

## 2022-04-23 MED ORDER — BUTALBITAL-APAP-CAFFEINE 50-325-40 MG PO CAPS: 1.0000 | ORAL_CAPSULE | Freq: Four times a day (QID) | ORAL | Status: DC | PRN

## 2022-04-23 MED ORDER — LABETALOL HCL 200 MG PO TABS
300.0000 mg | ORAL_TABLET | Freq: Two times a day (BID) | ORAL | Status: DC
  Administered 2022-04-24 – 2022-04-26 (×5): 300 mg via ORAL
  Filled 2022-04-23: qty 3
  Filled 2022-04-23 (×2): qty 1
  Filled 2022-04-23: qty 3
  Filled 2022-04-23: qty 1

## 2022-04-23 MED ORDER — MISOPROSTOL 25 MCG QUARTER TABLET
25.0000 ug | ORAL_TABLET | Freq: Once | ORAL | Status: DC
  Filled 2022-04-23: qty 1

## 2022-04-23 MED ORDER — OXYTOCIN-SODIUM CHLORIDE 30-0.9 UT/500ML-% IV SOLN
2.5000 [IU]/h | INTRAVENOUS | Status: DC
  Administered 2022-04-24: 2.5 [IU]/h via INTRAVENOUS

## 2022-04-23 MED ORDER — OXYTOCIN 10 UNIT/ML IJ SOLN
INTRAMUSCULAR | Status: AC
  Filled 2022-04-23: qty 2

## 2022-04-23 MED ORDER — BUTALBITAL-APAP-CAFFEINE 50-325-40 MG PO TABS
1.0000 | ORAL_TABLET | Freq: Four times a day (QID) | ORAL | Status: DC | PRN
  Administered 2022-04-23: 1 via ORAL
  Administered 2022-04-24: 2 via ORAL
  Filled 2022-04-23: qty 1
  Filled 2022-04-23: qty 2

## 2022-04-23 MED ORDER — FENTANYL CITRATE (PF) 100 MCG/2ML IJ SOLN
50.0000 ug | INTRAMUSCULAR | Status: DC | PRN
  Administered 2022-04-23: 50 ug via INTRAVENOUS
  Administered 2022-04-24: 100 ug via INTRAVENOUS
  Administered 2022-04-24 (×2): 50 ug via INTRAVENOUS
  Filled 2022-04-23 (×4): qty 2

## 2022-04-23 MED ORDER — TERBUTALINE SULFATE 1 MG/ML IJ SOLN: 0.2500 mg | Freq: Once | INTRAMUSCULAR | Status: DC | PRN

## 2022-04-23 MED ORDER — OXYTOCIN-SODIUM CHLORIDE 30-0.9 UT/500ML-% IV SOLN
1.0000 m[IU]/min | INTRAVENOUS | Status: DC
  Administered 2022-04-24 (×2): 2 m[IU]/min via INTRAVENOUS

## 2022-04-23 MED ORDER — LACTATED RINGERS IV SOLN: 500.0000 mL | INTRAVENOUS | Status: DC | PRN

## 2022-04-23 MED ORDER — MISOPROSTOL 25 MCG QUARTER TABLET
25.0000 ug | ORAL_TABLET | Freq: Once | ORAL | Status: AC
  Administered 2022-04-23: 25 ug via VAGINAL
  Filled 2022-04-23: qty 1

## 2022-04-23 MED ORDER — LACTATED RINGERS IV SOLN: INTRAVENOUS | Status: DC

## 2022-04-23 NOTE — Progress Notes (Signed)
  Labor Progress Note   21 y.o. [id number] @ [redacted]w[redacted]d , admitted for  Pregnancy, Labor Management. IOL IUGR, gHTN  Subjective:  Feeling contractions and coping well through them  Objective:  BP (!) 140/95 (BP Location: Left Arm)   Pulse 97   Temp 98.6 F (37 C) (Oral)   Resp 18   Ht 5\' 6"  (1.676 m)   Wt 92.1 kg   LMP 07/23/2021 (Exact Date)   BMI 32.77 kg/m  Abd: gravid, ND, FHT present, mild tenderness on exam Extr: no edema SVE: CERVIX: 2/60/-2 Attempted foley bulb placement, balloon failed  EFM: FHR: 145 bpm, variability: moderate,  accelerations:  Present,  decelerations:  Absent Toco: every 2 minutes Labs: I have reviewed the patient's lab results.   Assessment & Plan:  07/25/2021 @ [redacted]w[redacted]d, admitted for  Pregnancy and Labor/Delivery Management  1. Pain management:  position changes . 2. FWB: FHT category I.  3. ID: GBS negative 4. Labor management: start pitocin when contractions space out  All discussed with patient, see orders  [redacted]w[redacted]d, CNM [facility] 04/23/2022  8:42 PM

## 2022-04-23 NOTE — OB Triage Note (Signed)
Pt. [id number] at [redacted]w[redacted]d presents from MFM for PIH eval. Pt reports ongoing headache throughout pregnancy. Tylenol last taken today at 7:00am and rates headache pain 5/10. Pt denies vision changes, epigastric pain, bleeding, leaking of fluids, or contractions. Positive fetal movement reported. BP cycling Q15 minutes. Pt updated on plan of care and CNM made aware of pt arrival.

## 2022-04-23 NOTE — H&P (Signed)
OB History & Physical   History of Present Illness:  Chief Complaint: sent from MFM visit for evaluation of elevated blood pressure and suggestion of staying for induction  HPI:  Grace [name] is a 21 y.o. [phone] female at [redacted]w[redacted]d dated by 7 week u/s.  Her pregnancy has been complicated by IUGR, gHTN, headache, depression .    She reports irregular contractions prior to administration of cytotec.   She denies leakage of fluid.   She denies vaginal bleeding.   She reports fetal movement.    She denies visual changes or epigastric pain. She has been taking labetalol for the past 2 weeks with increased dosing. She has had an ongoing headache for the past month. Her labs have been normal with all recent evaluations.  Total weight gain for pregnancy: -2.268 kg   Obstetrical Problem List: February 2023 Problems (from 09/16/21 to present)     Problem Noted Resolved   Elevated blood pressure affecting pregnancy in third trimester, antepartum 04/01/2022 by Tresea Mall, CNM No   Overview Signed 04/14/2022  3:21 PM by Mirna Mires, CNM    Placed on labetalol 100 mg po Bid. On 04/14/2022 at a visit to the [facility] triage with c/o headache and higher Bps, her dosage of Labetalol was increased to 200 mg po BID.      Headache in pregnancy, antepartum, third trimester 04/01/2022 by Tresea Mall, CNM No   [redacted] weeks gestation of pregnancy 04/01/2022 by Tresea Mall, CNM No   Proteinuria affecting pregnancy in third trimester 02/08/2022 by Federico Flake, MD No   Overview Signed 02/08/2022  9:21 AM by Federico Flake, MD    6/13 UPC=371mg       Supervision of other normal pregnancy, antepartum 10/07/2021 by Ellwood Sayers, CNM No   Overview Addendum 04/17/2022  9:53 AM by Mirna Mires, CNM     Nursing Staff Provider  Office Location  [facility] Dating  [redacted]w[redacted]d on 2/7  Language  English  Anatomy US  Normal   Flu Vaccine   Genetic Screen  NIPS: neg, XX  TDaP vaccine    Hgb A1C or  GTT  Early :5.3 Third trimester :   Covid    LAB RESULTS   Rhogam  NA Blood Type A/Positive/-- (02/24 1114)   Feeding Plan  Antibody Negative (02/24 1114)  Contraception  Rubella 2.52 (02/24 1114)  Circumcision NA RPR Non Reactive (02/24 1114)   Pediatrician  Gavin Potters [place]  HBsAg Negative (02/24 1114)   Support Person [place]  HIV Non Reactive (02/24 1114)  Prenatal Classes multip  Varicella Immune    GBS  (For PCN allergy, check sensitivities) negative  BTL Consent     VBAC Consent  Pap  ASC-US HPV pos     Hgb Electro    Pelvis Tested  CF      SMA                   Maternal Medical History:   Past Medical History:  Diagnosis Date   Acne    Acute nonintractable headache 06/12/2020   Allergy    ALLERGIC RHINITIS   Anxiety    Asthma    Deliberate self-cutting    Depression    Fibrocystic disease of breast    Head pain    History of depression 12/20/2015   History of self-harm    Knee pain, right    Primary dysmenorrhea    Severe myopia of both eyes  Past Surgical History:  Procedure Laterality Date   LAPAROSCOPY  05/07/2017   Procedure: LAPAROSCOPY DIAGNOSTIC;  Surgeon: Schermerhorn, Ihor Austin, MD;  Location: [facility] ORS;  Service: Gynecology;;    No Known Allergies  Prior to Admission medications   Medication Sig Start Date End Date Taking? Authorizing Provider  albuterol (VENTOLIN HFA) 108 (90 Base) MCG/ACT inhaler Inhale 2 puffs into the lungs every 6 (six) hours as needed for wheezing or shortness of breath. 04/27/21  Yes Illa Level M, PA-C  labetalol (NORMODYNE) 200 MG tablet Take 1.5 tablets (300 mg total) by mouth 2 (two) times daily. 04/19/22  Yes Tresea Mall, CNM  montelukast (SINGULAIR) 10 MG tablet TAKE ONE TABLET BY MOUTH EVERYDAY AT BEDTIME 04/29/21  Yes Masoud, Renda Rolls, MD  ondansetron (ZOFRAN-ODT) 4 MG disintegrating tablet Take 1 tablet (4 mg total) by mouth every 8 (eight) hours as needed for nausea or vomiting. 02/22/22  Yes Doreene Burke, CNM   Prenatal MV & Min w/FA-DHA (PRENATAL GUMMIES PO) Take by mouth.   Yes [provider]  SYMBICORT 160-4.5 MCG/ACT inhaler SMARTSIG:2 Puff(s) By Mouth Twice Daily 12/19/21  Yes [provider]  trimethoprim-polymyxin b (POLYTRIM) ophthalmic solution Place 1 drop into the left eye every 6 (six) hours. 02/16/22  Yes Hawks, Christy A, FNP  Blood Pressure KIT 1 kit by Does not apply route daily. Notify provider for blood pressures less than 90/50 or 160/110 or greater 07/28/20   Gustavo Lah, CNM  Butalbital-APAP-Caffeine 42-613-08 MG capsule Take 1-2 capsules by mouth every 6 (six) hours as needed for headache. 04/17/22   Mirna Mires, CNM    OB History  Grace [name] Term Preterm AB Living  4 1 1  0 2 1  SAB IAB Ectopic Multiple Live Births  2 0 0 0 1    # Outcome Date GA Lbr Len/2nd Weight Sex Delivery Anes PTL Lv  4 Current           3 Term 07/26/20 [redacted]w[redacted]d / 00:48 2750 g M Vag-Spont EPI  LIV  2 SAB           1 SAB             Prenatal care site: [facility]  Social History: She  reports that she quit smoking about 21 months ago. Her smoking use included cigarettes and e-cigarettes. She started smoking about 5 years ago. She has a 0.50 pack-year smoking history. She has never used smokeless tobacco. She reports that she does not currently use alcohol. She reports current drug use. Frequency: 7.00 times per week. Drug: Marijuana.  Family History: family history includes ADD / ADHD in her brother; Anxiety disorder in her father and mother; Asthma in her father; Depression in her father and mother; Endometriosis in her mother; Heart attack (age of onset: 65) in her father; Heart disease in her father; Heart failure in her father; Hyperlipidemia in her father and mother; Hypertension in her father; Post-traumatic stress disorder in her father and mother.    Review of Systems:  Review of Systems  Constitutional:  Negative for chills and fever.  HENT:  Negative for  congestion, ear discharge, ear pain, hearing loss, sinus pain and sore throat.   Eyes:  Negative for blurred vision and double vision.  Respiratory:  Negative for cough, shortness of breath and wheezing.   Cardiovascular:  Negative for chest pain, palpitations and leg swelling.  Gastrointestinal:  Positive for abdominal pain. Negative for blood in stool, constipation, diarrhea, heartburn, melena,  nausea and vomiting.  Genitourinary:  Negative for dysuria, flank pain, frequency, hematuria and urgency.  Musculoskeletal:  Negative for back pain, joint pain and myalgias.  Skin:  Negative for itching and rash.  Neurological:  Positive for headaches. Negative for dizziness, tingling, tremors, sensory change, speech change, focal weakness, seizures, loss of consciousness and weakness.  Endo/Heme/Allergies:  Negative for environmental allergies. Does not bruise/bleed easily.  Psychiatric/Behavioral:  Negative for depression, hallucinations, memory loss, substance abuse and suicidal ideas. The patient is not nervous/anxious and does not have insomnia.      Physical Exam:  BP 137/82   Pulse 78   Temp 97.9 F (36.6 C) (Oral)   Resp 17   Ht $R'5\' 6"'Vx$  (1.676 m)   Wt 92.1 kg   LMP 07/23/2021 (Exact Date)   BMI 32.77 kg/m   Constitutional: Well nourished, well developed female in no acute distress.  HEENT: normal Skin: Warm and dry.  Cardiovascular: Regular rate and rhythm.   Extremity:  no edema   Respiratory: Clear to auscultation bilateral. Normal respiratory effort Abdomen: FHT present Back: no CVAT Neuro: DTRs 2+, Cranial nerves grossly intact Psych: Alert and Oriented x3. No memory deficits. Normal mood and affect.  MS: normal gait, normal bilateral lower extremity ROM/strength/stability.  Pelvic exam: (female chaperone present) is not limited by body habitus EGBUS: within normal limits Vagina: within normal limits and with normal mucosa  Cervix: 1/20/-2/posterior, cervical  sweep   Baseline FHR: 135 beats/min   Variability: moderate   Accelerations: present   Decelerations: absent Contractions: present frequency: every 1.5-3 minutes Overall assessment: reassuring   Lab Results  Component Value Date   SARSCOV2NAA NEGATIVE 07/25/2020    Assessment:  Grace [name] is a 70 y.o. [id number] female at [redacted]w[redacted]d with induction of labor for IUGR/gHTN with headache.   Plan:  Admit to Labor & Delivery  CBC, T&S, Clrs, IVF GBS negative.   Fetal well-being: category I Begin induction with cytotec, consider foley bulb at next check, pitocin when ready   Rod Can, CNM 04/23/2022 3:51 PM

## 2022-04-24 ENCOUNTER — Ambulatory Visit: Payer: Medicaid Other

## 2022-04-24 ENCOUNTER — Encounter: Payer: Self-pay | Admitting: Obstetrics and Gynecology

## 2022-04-24 ENCOUNTER — Inpatient Hospital Stay: Payer: Medicaid Other | Admitting: General Practice

## 2022-04-24 DIAGNOSIS — R519 Headache, unspecified: Secondary | ICD-10-CM | POA: Diagnosis not present

## 2022-04-24 DIAGNOSIS — O26893 Other specified pregnancy related conditions, third trimester: Secondary | ICD-10-CM

## 2022-04-24 DIAGNOSIS — Z3A36 36 weeks gestation of pregnancy: Secondary | ICD-10-CM

## 2022-04-24 DIAGNOSIS — O365930 Maternal care for other known or suspected poor fetal growth, third trimester, not applicable or unspecified: Secondary | ICD-10-CM | POA: Diagnosis not present

## 2022-04-24 DIAGNOSIS — O99344 Other mental disorders complicating childbirth: Secondary | ICD-10-CM

## 2022-04-24 DIAGNOSIS — O134 Gestational [pregnancy-induced] hypertension without significant proteinuria, complicating childbirth: Secondary | ICD-10-CM | POA: Diagnosis not present

## 2022-04-24 DIAGNOSIS — F32A Depression, unspecified: Secondary | ICD-10-CM

## 2022-04-24 LAB — COMPREHENSIVE METABOLIC PANEL
ALT: 7 U/L (ref 0–44)
AST: 23 U/L (ref 15–41)
Albumin: 2.8 g/dL — ABNORMAL LOW (ref 3.5–5.0)
Alkaline Phosphatase: 167 U/L — ABNORMAL HIGH (ref 38–126)
Anion gap: 7 (ref 5–15)
BUN: <5 mg/dL — ABNORMAL LOW (ref 6–20)
CO2: 21 mmol/L — ABNORMAL LOW (ref 22–32)
Calcium: 8.8 mg/dL — ABNORMAL LOW (ref 8.9–10.3)
Chloride: 109 mmol/L (ref 98–111)
Creatinine, Ser: 0.6 mg/dL (ref 0.44–1.00)
GFR, Estimated: >60 mL/min (ref >60)
Glucose, Bld: 166 mg/dL — ABNORMAL HIGH (ref 70–99)
Potassium: 3.5 mmol/L (ref 3.5–5.1)
Sodium: 137 mmol/L (ref 135–145)
Total Bilirubin: 0.7 mg/dL (ref 0.3–1.2)
Total Protein: 6.4 g/dL — ABNORMAL LOW (ref 6.5–8.1)

## 2022-04-24 LAB — CBC WITH DIFFERENTIAL/PLATELET
Abs Immature Granulocytes: 0.08 10*3/uL — ABNORMAL HIGH (ref 0.00–0.07)
Basophils Absolute: 0 10*3/uL (ref 0.0–0.1)
Basophils Relative: 0 %
Eosinophils Absolute: 0 10*3/uL (ref 0.0–0.5)
Eosinophils Relative: 0 %
HCT: 34 % — ABNORMAL LOW (ref 36.0–46.0)
Hemoglobin: 11.1 g/dL — ABNORMAL LOW (ref 12.0–15.0)
Immature Granulocytes: 1 %
Lymphocytes Relative: 9 %
Lymphs Abs: 1.2 10*3/uL (ref 0.7–4.0)
MCH: 27.2 pg (ref 26.0–34.0)
MCHC: 32.6 g/dL (ref 30.0–36.0)
MCV: 83.3 fL (ref 80.0–100.0)
Monocytes Absolute: 0.7 10*3/uL (ref 0.1–1.0)
Monocytes Relative: 5 %
Neutro Abs: 11.1 10*3/uL — ABNORMAL HIGH (ref 1.7–7.7)
Neutrophils Relative %: 85 %
Platelets: 259 10*3/uL (ref 150–400)
RBC: 4.08 MIL/uL (ref 3.87–5.11)
RDW: 13 % (ref 11.5–15.5)
WBC: 13.1 10*3/uL — ABNORMAL HIGH (ref 4.0–10.5)
nRBC: 0 % (ref 0.0–0.2)

## 2022-04-24 LAB — RPR: RPR Ser Ql: NONREACTIVE

## 2022-04-24 MED ORDER — PHENYLEPHRINE 80 MCG/ML (10ML) SYRINGE FOR IV PUSH (FOR BLOOD PRESSURE SUPPORT): 80.0000 ug | PREFILLED_SYRINGE | INTRAVENOUS | Status: DC | PRN

## 2022-04-24 MED ORDER — FENTANYL-BUPIVACAINE-NACL 0.5-0.125-0.9 MG/250ML-% EP SOLN
12.0000 mL/h | EPIDURAL | Status: DC | PRN
  Administered 2022-04-24: 12 mL/h via EPIDURAL

## 2022-04-24 MED ORDER — LACTATED RINGERS IV SOLN
500.0000 mL | Freq: Once | INTRAVENOUS | Status: AC
  Administered 2022-04-24: 500 mL via INTRAVENOUS

## 2022-04-24 MED ORDER — MISOPROSTOL 25 MCG QUARTER TABLET
25.0000 ug | ORAL_TABLET | Freq: Once | ORAL | Status: AC
  Administered 2022-04-24: 25 ug via ORAL

## 2022-04-24 MED ORDER — LABETALOL HCL 5 MG/ML IV SOLN: 80.0000 mg | INTRAVENOUS | Status: DC | PRN

## 2022-04-24 MED ORDER — LIDOCAINE HCL (PF) 1 % IJ SOLN
INTRAMUSCULAR | Status: DC | PRN
  Administered 2022-04-24 (×2): 3 mL

## 2022-04-24 MED ORDER — LABETALOL HCL 5 MG/ML IV SOLN
40.0000 mg | INTRAVENOUS | Status: DC | PRN
  Administered 2022-04-24: 40 mg via INTRAVENOUS
  Filled 2022-04-24: qty 8

## 2022-04-24 MED ORDER — HYDRALAZINE HCL 20 MG/ML IJ SOLN: 10.0000 mg | INTRAMUSCULAR | Status: DC | PRN

## 2022-04-24 MED ORDER — BUPIVACAINE HCL (PF) 0.25 % IJ SOLN
INTRAMUSCULAR | Status: DC | PRN
  Administered 2022-04-24 (×2): 4 mL via EPIDURAL

## 2022-04-24 MED ORDER — ACETAMINOPHEN 500 MG PO TABS
1000.0000 mg | ORAL_TABLET | Freq: Four times a day (QID) | ORAL | Status: DC
  Administered 2022-04-25 – 2022-04-26 (×6): 1000 mg via ORAL
  Filled 2022-04-24 (×6): qty 2

## 2022-04-24 MED ORDER — EPHEDRINE 5 MG/ML INJ: 10.0000 mg | INTRAVENOUS | Status: DC | PRN

## 2022-04-24 MED ORDER — DIPHENHYDRAMINE HCL 50 MG/ML IJ SOLN: 12.5000 mg | INTRAMUSCULAR | Status: DC | PRN

## 2022-04-24 MED ORDER — LABETALOL HCL 5 MG/ML IV SOLN: 20.0000 mg | INTRAVENOUS | Status: DC | PRN

## 2022-04-24 MED ORDER — BENZOCAINE-MENTHOL 20-0.5 % EX AERO
1.0000 | INHALATION_SPRAY | CUTANEOUS | Status: DC | PRN
  Filled 2022-04-24: qty 56

## 2022-04-24 MED ORDER — MISOPROSTOL 25 MCG QUARTER TABLET
ORAL_TABLET | ORAL | Status: AC
  Administered 2022-04-24: 25 ug via VAGINAL
  Filled 2022-04-24: qty 2

## 2022-04-24 MED ORDER — NIFEDIPINE ER OSMOTIC RELEASE 30 MG PO TB24
30.0000 mg | ORAL_TABLET | Freq: Every day | ORAL | Status: DC
  Administered 2022-04-24 – 2022-04-26 (×3): 30 mg via ORAL
  Filled 2022-04-24 (×3): qty 1

## 2022-04-24 MED ORDER — LABETALOL HCL 5 MG/ML IV SOLN
20.0000 mg | Freq: Once | INTRAVENOUS | Status: AC
  Administered 2022-04-24: 20 mg via INTRAVENOUS
  Filled 2022-04-24: qty 4

## 2022-04-24 MED ORDER — IBUPROFEN 600 MG PO TABS
600.0000 mg | ORAL_TABLET | Freq: Four times a day (QID) | ORAL | Status: DC
  Administered 2022-04-24 – 2022-04-26 (×7): 600 mg via ORAL
  Filled 2022-04-24 (×7): qty 1

## 2022-04-24 MED ORDER — FENTANYL-BUPIVACAINE-NACL 0.5-0.125-0.9 MG/250ML-% EP SOLN
EPIDURAL | Status: AC
  Filled 2022-04-24: qty 250

## 2022-04-24 MED ORDER — LIDOCAINE-EPINEPHRINE (PF) 1.5 %-1:200000 IJ SOLN
INTRAMUSCULAR | Status: DC | PRN
  Administered 2022-04-24: 3 mL via EPIDURAL

## 2022-04-24 MED ORDER — MISOPROSTOL 25 MCG QUARTER TABLET: 25.0000 ug | ORAL_TABLET | Freq: Once | ORAL | Status: AC

## 2022-04-24 MED ORDER — OXYCODONE HCL 5 MG PO TABS
5.0000 mg | ORAL_TABLET | Freq: Four times a day (QID) | ORAL | Status: DC | PRN
  Administered 2022-04-24: 5 mg via ORAL
  Filled 2022-04-24: qty 1

## 2022-04-24 NOTE — Progress Notes (Signed)
15 min post labetolol

## 2022-04-24 NOTE — Progress Notes (Signed)
   Subjective:  Comfortable with epidural.  Agreeable to AROM.   Objective:   Vitals: Blood pressure 137/78, pulse 80, temperature (!) 97.5 F (36.4 C), resp. rate 16, height 5\' 6"  (1.676 m), weight 92.1 kg, last menstrual period 07/23/2021, SpO2 100 %, unknown if currently breastfeeding. General: NAD Abdomen:non tender  Cervical Exam:  Dilation: 4.5 Effacement (%): 50 Cervical Position: Posterior Station: -2 Presentation: Vertex Exam by:: L Jovany Disano, CNM  FHT: baseline 135, moderate variability, pos accel, 1 early  Toco:q 2-3.5  No results found for this or any previous visit (from the past 24 hour(s)).  Assessment:   21 y.o. 21 [redacted]w[redacted]d admitted for IOL secondary to IUGR and GHTN   Plan:   1) Labor -received Cytotec yesterday, Pitocin overnight-then stopped in the A.M, FB and cytotec placed around 0920.  FB out at 1300.AROM at 1400.    2) Fetus - category 1 tracing   3) [redacted]w[redacted]d for large amount of clear fluid at 1400, GBS negative   4) pain management: Epidural managed by Anesthesia  SH:FWYO, CNM  Carie Caddy, [facility]  @TODAY @  2:11 PM

## 2022-04-24 NOTE — Progress Notes (Signed)
25 minute post 20mg  labetolol

## 2022-04-24 NOTE — Progress Notes (Signed)
Pt place on heating pad on her lower midline abdomen.

## 2022-04-24 NOTE — Anesthesia Preprocedure Evaluation (Signed)
Anesthesia Evaluation  Patient identified by MRN, date of birth, ID band Patient awake    Reviewed: Allergy & Precautions, H&P , NPO status , Patient's Chart, lab work & pertinent test results  Airway Mallampati: III  TM Distance: >3 FB     Dental no notable dental hx.    Pulmonary asthma , former smoker,           Cardiovascular hypertension,      Neuro/Psych  Headaches, Anxiety Depression    GI/Hepatic Neg liver ROS, GERD  ,  Endo/Other  negative endocrine ROS  Renal/GU negative Renal ROS  negative genitourinary   Musculoskeletal   Abdominal   Peds  Hematology negative hematology ROS (+)   Anesthesia Other Findings   Reproductive/Obstetrics (+) Pregnancy                             Anesthesia Physical Anesthesia Plan  ASA: 2  Anesthesia Plan: Epidural   Post-op Pain Management:    Induction:   PONV Risk Score and Plan:   Airway Management Planned:   Additional Equipment:   Intra-op Plan:   Post-operative Plan:   Informed Consent: I have reviewed the patients History and Physical, chart, labs and discussed the procedure including the risks, benefits and alternatives for the proposed anesthesia with the patient or authorized representative who has indicated his/her understanding and acceptance.       Plan Discussed with: Anesthesiologist and CRNA  Anesthesia Plan Comments:         Anesthesia Quick Evaluation

## 2022-04-24 NOTE — Discharge Summary (Cosign Needed Addendum)
Obstetrical Discharge Summary  Date of Admission: 04/23/2022 Date of Discharge: 04/25/2022  Primary OB: [facility]  Gestational Age at Delivery: [redacted]w[redacted]d   Antepartum complications: intrauterine growth restriction and GHTN Reason for Admission: IOL Date of Delivery: 04/24/2022 at 1751  Delivered By: Lawanda Cousins, CNM  Delivery Type: spontaneous vaginal delivery Intrapartum complications/course: None Anesthesia: epidural Placenta: Delivered and expressed via active management. Intact: yes. To pathology: no.  Laceration: labial Episiotomy: none EBL: 150ml Baby: Liveborn female, APGARs 6/9, weight 2480 g.    Discharge Diagnosis:  Pre-term pregnancy, delivered.   Postpartum course: Normal Discharge Vital Signs:  Current Vital Signs 24h Vital Sign Ranges  T 98.5 F (36.9 C) Temp  Avg: 98 F (36.7 C)  Min: 97.4 F (36.3 C)  Max: 98.6 F (37 C)  BP 127/84 BP  Min: 122/82  Max: 169/96  HR (!) 101 Pulse  Avg: 92.8  Min: 76  Max: 109  RR 18 Resp  Avg: 18  Min: 16  Max: 20  SaO2 98 % Room Air SpO2  Avg: 99.3 %  Min: 97 %  Max: 100 %       24 Hour I/O Current Shift I/O  Time Ins Outs 08/31 0701 - 09/01 0700 In: -  Out: 2050 [Urine:2050] No intake/output data recorded.     Patient Vitals for the past 6 hrs:  BP Temp Temp src Pulse Resp SpO2  04/25/22 0807 127/84 98.5 F (36.9 C) Oral (!) 101 18 98 %    Discharge Exam:  NAD Perineum: Intact Abdomen: firm fundus below the umbilicus, NTTP, non distended, +bowel sounds.   Cardiac: RRR no MRGs Lungs: CTAB Ext: no edema  Recent Labs  Lab 04/23/22 1049 04/24/22 1942 04/25/22 0432  WBC 5.4 13.1* 11.7*  HGB 10.3* 11.1* 10.2*  HCT 32.1* 34.0* 30.9*  PLT 262 259 243    Disposition: Home  Rh Immune globulin given: yes Rubella vaccine given: yes Tdap vaccine given in AP or PP setting: yes Flu vaccine given in AP or PP setting: no  Contraception: Depo-Provera  Prenatal/Postnatal Panel: A POS//Rubella Immune//Varicella  Immune//RPR negative//HIV negative/HepB Surface Ag negative//pap ASCUS with POSITIVE high risk HPV (date: 08/2021)//plans to breastfeed  Plan:  Grace [name] was discharged to home in good condition. Follow-up appointment for BP check in one week, then with LMD in 2 for mood check and 6weeks for a PP visit. Has behavioral health appt scheduled.  No future appointments.  Discharge Medications: Allergies as of 04/25/2022   No Known Allergies      Medication List     STOP taking these medications    ondansetron 4 MG disintegrating tablet Commonly known as: ZOFRAN-ODT   trimethoprim-polymyxin b ophthalmic solution Commonly known as: Polytrim       TAKE these medications    albuterol 108 (90 Base) MCG/ACT inhaler Commonly known as: VENTOLIN HFA Inhale 2 puffs into the lungs every 6 (six) hours as needed for wheezing or shortness of breath.   Blood Pressure Kit 1 kit by Does not apply route daily. Notify provider for blood pressures less than 90/50 or 160/110 or greater   Butalbital-APAP-Caffeine 50-325-40 MG capsule Take 1-2 capsules by mouth every 6 (six) hours as needed for headache.   labetalol 200 MG tablet Commonly known as: NORMODYNE Take 1.5 tablets (300 mg total) by mouth 2 (two) times daily.   montelukast 10 MG tablet Commonly known as: SINGULAIR TAKE ONE TABLET BY MOUTH EVERYDAY AT BEDTIME   NIFEdipine 30 MG 24  hr tablet Commonly known as: ADALAT CC Take 1 tablet (30 mg total) by mouth daily. Start taking on: April 26, 2022   PRENATAL GUMMIES PO Take by mouth.   Symbicort 160-4.5 MCG/ACT inhaler Generic drug: budesonide-formoterol SMARTSIG:2 Puff(s) By Mouth Twice Daily       Grace [name], CNM 11:03 AM 04/25/22

## 2022-04-24 NOTE — Progress Notes (Signed)
Subjective:  Feels better after taking shower and having breakfast. Reviewed assessment and plan.  Agreeable to FB and cytotec. Currently denies HA.   Objective:   Vitals: Blood pressure 121/61, pulse 76, temperature 98.1 F (36.7 C), temperature source Oral, resp. rate 14, height 5\' 6"  (1.676 m), weight 92.1 kg, last menstrual period 07/23/2021, unknown if currently breastfeeding. General: NAD Abdomen:non tender  Cervical Exam:  Dilation: 2 Effacement (%): 20 Cervical Position: Posterior Station: -3 Exam by:: [email or web address] CNM  FHT: baseline 125, moderate variability, pos accel, neg decel Toco:irregular, soft resting tone   Results for orders placed or performed during the hospital encounter of 04/23/22 (from the past 24 hour(s))  Protein / creatinine ratio, urine     Status: None   Collection Time: 04/23/22 10:44 AM  Result Value Ref Range   Creatinine, Urine 99 mg/dL   Total Protein, Urine 11 mg/dL   Protein Creatinine Ratio 0.11 0.00 - 0.15 mg/mg[Cre]  CBC     Status: Abnormal   Collection Time: 04/23/22 10:49 AM  Result Value Ref Range   WBC 5.4 4.0 - 10.5 K/uL   RBC 3.83 (L) 3.87 - 5.11 MIL/uL   Hemoglobin 10.3 (L) 12.0 - 15.0 g/dL   HCT 04/25/22 (L) 26.7 - 12.4 %   MCV 83.8 80.0 - 100.0 fL   MCH 26.9 26.0 - 34.0 pg   MCHC 32.1 30.0 - 36.0 g/dL   RDW 58.0 99.8 - 33.8 %   Platelets 262 150 - 400 K/uL   nRBC 0.0 0.0 - 0.2 %  Comprehensive metabolic panel     Status: Abnormal   Collection Time: 04/23/22 10:49 AM  Result Value Ref Range   Sodium 135 135 - 145 mmol/L   Potassium 3.5 3.5 - 5.1 mmol/L   Chloride 107 98 - 111 mmol/L   CO2 23 22 - 32 mmol/L   Glucose, Bld 88 70 - 99 mg/dL   BUN <5 (L) 6 - 20 mg/dL   Creatinine, Ser 04/25/22 0.44 - 1.00 mg/dL   Calcium 8.6 (L) 8.9 - 10.3 mg/dL   Total Protein 6.4 (L) 6.5 - 8.1 g/dL   Albumin 2.9 (L) 3.5 - 5.0 g/dL   AST 15 15 - 41 U/L   ALT 6 0 - 44 U/L   Alkaline Phosphatase 159 (H) 38 - 126 U/L   Total Bilirubin 0.5 0.3 - 1.2  mg/dL   GFR, Estimated 5.39 >76 mL/min   Anion gap 5 5 - 15  Type and screen     Status: None   Collection Time: 04/23/22  1:47 PM  Result Value Ref Range   ABO/RH(D) A POS    Antibody Screen NEG    Sample Expiration      04/26/2022,2359 Performed at [facility], [address] [place], [place]   RPR     Status: None   Collection Time: 04/23/22  1:47 PM  Result Value Ref Range   RPR Ser Ql NON REACTIVE NON REACTIVE    Assessment:   21 y.o. 21 [redacted]w[redacted]d admitted for IOL for IUGR and GHTN  Plan:   1) Labor -received Cytotec yesterday, Pitocin overnight-then stopped in the A.M, FB and cytotec placed now.   2) Fetus - category 1 tracing  3) ID: membranes intact, GBS negative  4) pain management: aware of all options, will ask if desired.   [redacted]w[redacted]d, CNM  Carie Caddy, [facility]  @TODAY @  9:28 AM

## 2022-04-24 NOTE — Progress Notes (Signed)
   Subjective:  Feeling more rectal pressure, anterior lip, offered to attempt to reduce cervix or allow her body to work through the cervix, pt prefers to breath through the contractions.   Objective:   Vitals: Blood pressure 132/80, pulse 94, temperature 98.1 F (36.7 C), resp. rate 16, height 5\' 6"  (1.676 m), weight 92.1 kg, last menstrual period 07/23/2021, SpO2 100 %, unknown if currently breastfeeding. General: NAD Abdomen:non tender  Cervical Exam:  Dilation: 9 Effacement (%): 70 Cervical Position: Posterior Station: 0 Presentation: Vertex Exam by:: L Hiba Garry CNM  FHT: baseline 130, moderate variability, neg accel, early decel present  Toco:q 3-4 Pitocin at 4 milli-units/min   No results found for this or any previous visit (from the past 24 hour(s)).  Assessment:   21 y.o. 21 [redacted]w[redacted]d  admitted for IOL secondary to IUGR and GHTN   Plan:  1) Labor -received Cytotec yesterday, Pitocin overnight-then stopped in the A.M, FB and cytotec placed around 0920.  FB out at 1300.AROM at 1400. Pitocin infusing. Will rest for 30 mins then reassess.    2) Fetus - category 1 tracing   3) [redacted]w[redacted]d for large amount of clear fluid at 1400, GBS negative   4) pain management: Epidural managed by Anesthesia  GF:QMKJ, CNM  Carie Caddy, [facility]  @TODAY @  5:34 PM

## 2022-04-24 NOTE — Anesthesia Procedure Notes (Addendum)
Epidural Patient location during procedure: OB Start time: 04/24/2022 1:21 PM End time: 04/24/2022 1:37 PM  Staffing Anesthesiologist: Stephanie Coup, MD Resident/CRNA: Irving Burton, CRNA Performed: resident/CRNA   Preanesthetic Checklist Completed: patient identified, IV checked, site marked, risks and benefits discussed, surgical consent, monitors and equipment checked, pre-op evaluation and timeout performed  Epidural Patient position: sitting Prep: ChloraPrep Patient monitoring: heart rate, continuous pulse ox and blood pressure Approach: midline Location: L3-L4 Injection technique: LOR saline  Needle:  Needle type: Tuohy  Needle gauge: 17 G Needle length: 9 cm and 9 Needle insertion depth: 8 cm Catheter type: closed end flexible Catheter size: 19 Gauge Catheter at skin depth: 13 cm Test dose: negative and 1.5% lidocaine with Epi 1:200 K  Assessment Sensory level: T10 Events: blood not aspirated, injection not painful, no injection resistance, no paresthesia and negative IV test  Additional Notes 2 attempt Pt. Evaluated and documentation done after procedure finished. Patient identified. Risks/Benefits/Options discussed with patient including but not limited to bleeding, infection, nerve damage, paralysis, failed block, incomplete pain control, headache, blood pressure changes, nausea, vomiting, reactions to medication both or allergic, itching and postpartum back pain. Confirmed with bedside nurse the patient's most recent platelet count. Confirmed with patient that they are not currently taking any anticoagulation, have any bleeding history or any family history of bleeding disorders. Patient expressed understanding and wished to proceed. All questions were answered. Sterile technique was used throughout the entire procedure. Please see nursing notes for vital signs. Test dose was given through epidural catheter and negative prior to continuing to dose epidural or  start infusion. Warning signs of high block given to the patient including shortness of breath, tingling/numbness in hands, complete motor block, or any concerning symptoms with instructions to call for help. Patient was given instructions on fall risk and not to get out of bed. All questions and concerns addressed with instructions to call with any issues or inadequate analgesia.    Patient tolerated the insertion well without immediate complications.Reason for block:procedure for pain

## 2022-04-24 NOTE — Progress Notes (Signed)
   Subjective:  More uncomfortable, having a difficulty time relaxing.  Family member concerned about the amount of time it is taking, asking how long we are going to "do this" . Discussed this is a normal course of events for an induction.   Objective:   Vitals: Blood pressure 137/83, pulse 93, temperature (!) 97.5 F (36.4 C), resp. rate 16, height 5\' 6"  (1.676 m), weight 92.1 kg, last menstrual period 07/23/2021, unknown if currently breastfeeding. General: NAD Abdomen:non tender  Cervical Exam:  Dilation: 2 Effacement (%): 20 Cervical Position: Posterior Station: -3 Exam by:: [email or web address] CNM  FHT: baseline 130, moderate variability, pos accel, neg decel  Toco:q 3-4  Results for orders placed or performed during the hospital encounter of 04/23/22 (from the past 24 hour(s))  Type and screen     Status: None   Collection Time: 04/23/22  1:47 PM  Result Value Ref Range   ABO/RH(D) A POS    Antibody Screen NEG    Sample Expiration      04/26/2022,2359 Performed at [facility], [address] [place], [place]   RPR     Status: None   Collection Time: 04/23/22  1:47 PM  Result Value Ref Range   RPR Ser Ql NON REACTIVE NON REACTIVE    Assessment:   21 y.o. 21 [redacted]w[redacted]d admitted for IOL secondary to IUGR and GHTN   Plan:    1) Labor -received Cytotec yesterday, Pitocin overnight-then stopped in the A.M, FB and cytotec placed around 0920.  FB out at 1300. Will assess after epidural placement, consider AROM if appropriate, pt agreeable with plan.    2) Fetus - category 1 tracing   3) ID: membranes intact, GBS negative   4) pain management: Anesthesia notified for epidural.   [redacted]w[redacted]d, CNM  Carie Caddy, [facility]  @TODAY @  1:21 PM

## 2022-04-25 LAB — CBC
HCT: 30.9 % — ABNORMAL LOW (ref 36.0–46.0)
Hemoglobin: 10.2 g/dL — ABNORMAL LOW (ref 12.0–15.0)
MCH: 27.5 pg (ref 26.0–34.0)
MCHC: 33 g/dL (ref 30.0–36.0)
MCV: 83.3 fL (ref 80.0–100.0)
Platelets: 243 10*3/uL (ref 150–400)
RBC: 3.71 MIL/uL — ABNORMAL LOW (ref 3.87–5.11)
RDW: 13 % (ref 11.5–15.5)
WBC: 11.7 10*3/uL — ABNORMAL HIGH (ref 4.0–10.5)
nRBC: 0 % (ref 0.0–0.2)

## 2022-04-25 MED ORDER — SIMETHICONE 80 MG PO CHEW: 80.0000 mg | CHEWABLE_TABLET | ORAL | Status: DC | PRN

## 2022-04-25 MED ORDER — DIPHENHYDRAMINE HCL 25 MG PO CAPS: 25.0000 mg | ORAL_CAPSULE | Freq: Four times a day (QID) | ORAL | Status: DC | PRN

## 2022-04-25 MED ORDER — WITCH HAZEL-GLYCERIN EX PADS
1.0000 | MEDICATED_PAD | CUTANEOUS | Status: DC | PRN
  Filled 2022-04-25: qty 100

## 2022-04-25 MED ORDER — ZOLPIDEM TARTRATE 5 MG PO TABS: 5.0000 mg | ORAL_TABLET | Freq: Every evening | ORAL | Status: DC | PRN

## 2022-04-25 MED ORDER — ONDANSETRON HCL 4 MG/2ML IJ SOLN: 4.0000 mg | INTRAMUSCULAR | Status: DC | PRN

## 2022-04-25 MED ORDER — MEDROXYPROGESTERONE ACETATE 150 MG/ML IM SUSP
150.0000 mg | Freq: Once | INTRAMUSCULAR | Status: AC
  Administered 2022-04-25: 150 mg via INTRAMUSCULAR
  Filled 2022-04-25: qty 1

## 2022-04-25 MED ORDER — DOCUSATE SODIUM 100 MG PO CAPS
100.0000 mg | ORAL_CAPSULE | Freq: Two times a day (BID) | ORAL | Status: DC
  Administered 2022-04-25 – 2022-04-26 (×3): 100 mg via ORAL
  Filled 2022-04-25 (×3): qty 1

## 2022-04-25 MED ORDER — ONDANSETRON HCL 4 MG PO TABS: 4.0000 mg | ORAL_TABLET | ORAL | Status: DC | PRN

## 2022-04-25 MED ORDER — COCONUT OIL OIL: 1.0000 | TOPICAL_OIL | Status: DC | PRN

## 2022-04-25 MED ORDER — NIFEDIPINE ER 30 MG PO TB24: 30.0000 mg | ORAL_TABLET | Freq: Every day | ORAL | 1 refills | Status: DC

## 2022-04-25 MED ORDER — PRENATAL MULTIVITAMIN CH
1.0000 | ORAL_TABLET | Freq: Every day | ORAL | Status: DC
  Administered 2022-04-25 – 2022-04-26 (×2): 1 via ORAL
  Filled 2022-04-25 (×2): qty 1

## 2022-04-25 MED ORDER — DIBUCAINE (PERIANAL) 1 % EX OINT
1.0000 | TOPICAL_OINTMENT | CUTANEOUS | Status: DC | PRN
  Filled 2022-04-25: qty 28

## 2022-04-25 NOTE — Anesthesia Postprocedure Evaluation (Signed)
Anesthesia Post Note  Patient: Grace [name]  Procedure(s) Performed: AN AD HOC LABOR EPIDURAL  Patient location during evaluation: Mother Baby Anesthesia Type: Epidural Level of consciousness: awake, awake and alert and oriented Pain management: pain level controlled Vital Signs Assessment: post-procedure vital signs reviewed and stable Respiratory status: spontaneous breathing, respiratory function stable and nonlabored ventilation Cardiovascular status: blood pressure returned to baseline and stable Postop Assessment: no headache, no backache and patient able to bend at knees Anesthetic complications: no   No notable events documented.   Last Vitals:  Vitals:   04/25/22 0446 04/25/22 0807  BP: 122/82 127/84  Pulse: 92 (!) 101  Resp: 18 18  Temp: 36.7 C 36.9 C  SpO2: 99% 98%    Last Pain:  Vitals:   04/25/22 0827  TempSrc:   PainSc: 0-No pain                 Sanari Offner Waldemar Dickens

## 2022-04-25 NOTE — Final Progress Note (Signed)
Post Partum Day 0 Subjective: Grace [name] is feeling well and would like to go home this evening. She is ambulating and voiding without difficulty. She feels her mood is good. Her pain is well-controlled, and her bleeding is minimal. Breastfeeding is going well. Her BPs have been in the 120s/80s. She denies HA, visual changes, and RUQ pain. She has an appointment scheduled postpartum with behavioral health.  Objective: Blood pressure 127/84, pulse (!) 101, temperature 98.5 F (36.9 C), temperature source Oral, resp. rate 18, height 5\' 6"  (1.676 m), weight 92.1 kg, last menstrual period 07/23/2021, SpO2 98 %, unknown if currently breastfeeding.  Physical Exam:  General: alert, cooperative, and appears stated age 21: appropriate Uterine Fundus: firm Incision: N/A DVT Evaluation: No evidence of DVT seen on physical exam.  Recent Labs    04/24/22 1942 04/25/22 0432  HGB 11.1* 10.2*  HCT 34.0* 30.9*    Assessment/Plan: Discharge home this evening Breastfeeding Continue labetalol and Procardia Discharge instructions reviewed BP check in one week Mood check in 2 weeks PP visit in 6 weeks   LOS: 2 days   06/25/22, CNM 04/25/2022, 11:04 AM

## 2022-04-25 NOTE — Lactation Note (Signed)
This note was copied from a baby's chart. Lactation Consultation Note  Patient Name: Grace [name] [id number] Date: 04/25/2022 Reason for consult: Initial assessment;Late-preterm 34-36.6wks Age:22 hours  Maternal Data  This is mom's 2nd baby who is IUGR and 36 6/7 weeks, NSVD. Mom is an experienced breastfeeding mother who breastfed her first baby born early term for 7 months. Per mom she had some initial difficulties breastfeeding her first baby in which she also supplemented with formula and pumped. Per mom she had an oversupply of milk from pumping and wants to avoid pumping this time. Her goal is to exclusively breastfeed this baby.Mom with a history of anxiety, depression, gHTN.   Does the patient have breastfeeding experience prior to this delivery?: Yes How long did the patient breastfeed?: 7 months  Feeding Mother's Current Feeding Choice: Breast Milk In to the room to meet with mom. Mom independently breastfeeding baby. Baby with good latch with multiple swallows noted. Baby breastfed 25 minutes in duration. Discussed with mom potential for inconsistent breastfeeding as baby is late preterm. Discussed feeding plan for LPT. Mom reports she does want to avoid pumping .She does have an electric breastpump and verbalizes understanding if baby is not consistently breastfeeding to initiate pumping and offer breastmilk via alternate method. Provided mom with additional supplemental milk and slow flow bottles as a bridge if she pumps and does not obtain measurable amounts of breastmilk.  LATCH Score Latch: Grasps breast easily, tongue down, lips flanged, rhythmical sucking.  Audible Swallowing: Spontaneous and intermittent  Type of Nipple: Everted at rest and after stimulation  Comfort (Breast/Nipple): Soft / non-tender  Hold (Positioning): No assistance needed to correctly position infant at breast.  LATCH Score: 10  Interventions Interventions: Education (Reviewed LPT breastfeeding  behaviors and potential for inconsistent breastfeeding. Reviewed feeding plan for LPT infant in anticipation of possible discharge this evening.) Feeding Plan -Mom understands to offer baby to eat at least 8 times/24 hours, with feeding cues and at least every 3 hours. -If baby will not latch, latches and falls asleep, or does not complete a feeding at the breast mom is advised to pump and offer any expressed breastmilk and the formula supplement provided.(24-48 hours:10-20 ml's, 48-72 hours: 20-30 ml's, > than 72 hours 30 ml's. -Mom to limit feeding attempts to 10 minutes if baby is not latching. -If baby is actively sucking with swallows mom to limit duration of feeding to 30 minutes. Mom can massage her breast to help facilitate milk transfer. -If baby refuses 2 feedings in a row or it has been 6 hours mom understands to call the baby's pediatrician. -Recommended mom avoid pacifier use for now. -Mom understands to keep a record of baby's feeding, wet, and dirty diapers and take to the first pediatric check-up. - Reviewed rationale for post pumping. Mom prefers not to initiate pumping as baby is latching and feeding .  Mom verbalized understanding and is agreement with plan reviewed.  Discharge Discharge Education: Warning signs for feeding baby;Engorgement and breast care;Other (comment) (Provided mom with info for outpatient lactation assistance at [facility] if mom has additional BF questions or needs assistance once she goes home.) Pump: Personal (Mom reports she has a personal electric pump for home use.)  Per mom baby's follow-up appointment is scheduled for Wednesday. Care nurse and Dr.Strait updated. Dr.Strait called clinic([facility] [phone] plans to bring baby for follow-up to obtain a sooner appointment. First available Tuesday. Dr.Strait to follow-up with mom regarding discharge to home.  Consult  Status Consult Status: PRN   Grace [name] 04/25/2022, 3:19  PM

## 2022-04-25 NOTE — Progress Notes (Signed)
PROGRESS NOTE  Royalti will not discharge today due to elevated bilirubin in her newborn. Plan for discharge tomorrow.  Quitman Livings, CNM

## 2022-04-26 MED ORDER — LABETALOL HCL 300 MG PO TABS: 300.0000 mg | ORAL_TABLET | Freq: Two times a day (BID) | ORAL | 1 refills | Status: DC

## 2022-04-26 NOTE — Lactation Note (Signed)
This note was copied from a baby's chart. Lactation Consultation Note  Patient Name: Grace [name] [id number] Date: 04/26/2022 Reason for consult: Follow-up assessment;Late-preterm 34-36.6wks Age:22 hours  Maternal Data Does the patient have breastfeeding experience prior to this delivery?: Yes How long did the patient breastfeed?: 7 mths  Feeding Mother's Current Feeding Choice: Breast Milk  LATCH Score Latch:  (I did not observe a feeding, had recently fed)                  Lactation Tools Discussed/Used    Interventions    Discharge Pump: Personal  Consult Status Consult Status: PRN    [name] 04/26/2022, 12:45 PM

## 2022-04-26 NOTE — Discharge Summary (Signed)
Postpartum Discharge Summary  Date of Service updated 04/26/2022     Patient Name: Grace [name] DOB: Dec 04, 2000 MRN: [id number]  Date of admission: 04/23/2022 Delivery date:04/24/2022  Delivering provider: Roberto Scales MARIE  Date of discharge: 04/26/2022  Admitting diagnosis: Gestational hypertension affecting fourth pregnancy [O13.9] Encounter for induction of labor [Z34.90] Intrauterine pregnancy: [redacted]w[redacted]d     Secondary diagnosis:  Principal Problem:   Gestational hypertension affecting fourth pregnancy Active Problems:   Encounter for induction of labor  Additional problems: hypertension    Discharge diagnosis: Preterm Pregnancy Delivered and Gestational Hypertension                                              Post partum procedures: none Augmentation: N/A Complications: None  Hospital course: Onset of Labor With Vaginal Delivery      21 y.o. yo [id number] at [redacted]w[redacted]d was admitted  for induction of labor on 04/23/2022. Patient had an uncomplicated labor course as follows:  Membrane Rupture Time/Date: 2:04 PM ,04/24/2022   Delivery Method:Vaginal, Spontaneous  Episiotomy: None  Lacerations:  Labial;None  Patient had an uncomplicated postpartum course.  She is ambulating, tolerating a regular diet, passing flatus, and urinating well. Patient is discharged home in stable condition on 04/26/22.  Newborn Data: Birth date:04/24/2022  Birth time:5:51 PM  Gender:Female  Living status:Living  Apgars:6 ,9  Weight:2480 g   Magnesium Sulfate received: No BMZ received: No Rhophylac:N/A MMR:No T-DaP:Given prenatally Flu: No Transfusion:No  Physical exam  Vitals:   04/25/22 2216 04/25/22 2328 04/26/22 0418 04/26/22 0929  BP: 124/72 132/83 (!) 126/96 (!) 132/92  Pulse: 76 86 87 80  Resp: $Remo'18 18 18 20  'AulKk$ Temp: 98.4 F (36.9 C) 98.6 F (37 C) 98.6 F (37 C) 98.1 F (36.7 C)  TempSrc: Oral Oral Oral Oral  SpO2: 100% 99% 100% 100%  Weight:      Height:       General: alert,  cooperative, and no distress Very eager for discharge Lochia: appropriate Uterine Fundus: firm Incision: Healing well with no significant drainage DVT Evaluation: No evidence of DVT seen on physical exam. Negative Homan's sign. Labs: Lab Results  Component Value Date   WBC 11.7 (H) 04/25/2022   HGB 10.2 (L) 04/25/2022   HCT 30.9 (L) 04/25/2022   MCV 83.3 04/25/2022   PLT 243 04/25/2022      Latest Ref Rng & Units 04/24/2022    7:42 PM  CMP  Glucose 70 - 99 mg/dL 166   BUN 6 - 20 mg/dL <5   Creatinine 0.44 - 1.00 mg/dL 0.60   Sodium 135 - 145 mmol/L 137   Potassium 3.5 - 5.1 mmol/L 3.5   Chloride 98 - 111 mmol/L 109   CO2 22 - 32 mmol/L 21   Calcium 8.9 - 10.3 mg/dL 8.8   Total Protein 6.5 - 8.1 g/dL 6.4   Total Bilirubin 0.3 - 1.2 mg/dL 0.7   Alkaline Phos 38 - 126 U/L 167   AST 15 - 41 U/L 23   ALT 0 - 44 U/L 7    Edinburgh Score:    04/25/2022    1:50 PM  Edinburgh Postnatal Depression Scale Screening Tool  I have been able to laugh and see the funny side of things. 0  I have looked forward with enjoyment to things. 0  I have blamed myself  unnecessarily when things went wrong. 0  I have been anxious or worried for no good reason. 0  I have felt scared or panicky for no good reason. 0  Things have been getting on top of me. 1  I have been so unhappy that I have had difficulty sleeping. 0  I have felt sad or miserable. 0  I have been so unhappy that I have been crying. 0  The thought of harming myself has occurred to me. 0  Edinburgh Postnatal Depression Scale Total 1      After visit meds:  Allergies as of 04/26/2022   No Known Allergies      Medication List     STOP taking these medications    ondansetron 4 MG disintegrating tablet Commonly known as: ZOFRAN-ODT   trimethoprim-polymyxin b ophthalmic solution Commonly known as: Polytrim       TAKE these medications    albuterol 108 (90 Base) MCG/ACT inhaler Commonly known as: VENTOLIN HFA Inhale  2 puffs into the lungs every 6 (six) hours as needed for wheezing or shortness of breath.   Blood Pressure Kit 1 kit by Does not apply route daily. Notify provider for blood pressures less than 90/50 or 160/110 or greater   Butalbital-APAP-Caffeine 50-325-40 MG capsule Take 1-2 capsules by mouth every 6 (six) hours as needed for headache.   labetalol 200 MG tablet Commonly known as: NORMODYNE Take 1.5 tablets (300 mg total) by mouth 2 (two) times daily.   montelukast 10 MG tablet Commonly known as: SINGULAIR TAKE ONE TABLET BY MOUTH EVERYDAY AT BEDTIME   NIFEdipine 30 MG 24 hr tablet Commonly known as: ADALAT CC Take 1 tablet (30 mg total) by mouth daily.   PRENATAL GUMMIES PO Take by mouth.   Symbicort 160-4.5 MCG/ACT inhaler Generic drug: budesonide-formoterol SMARTSIG:2 Puff(s) By Mouth Twice Daily         Discharge home in stable condition Infant Feeding: Breast Infant Disposition:home with mother Discharge instruction: per After Visit Summary and Postpartum booklet. Activity: Advance as tolerated. Pelvic rest for 6 weeks.  Diet: routine diet Anticipated Birth Control: Unsure Postpartum Appointment:1 week Additional Postpartum F/U: Postpartum Depression checkup and and 6 week PP physical Future Appointments:No future appointments. Follow up Visit:  Follow-up Information     Dominic, Nunzio Cobbs, CNM Follow up in 2 week(s).   Specialty: Obstetrics and Gynecology Why: BP check in one week 2 weeks for mood check then in 6wks for PP visit Contact information: [employer] [place] [address] [phone]                Stressed the importance of a one week BP check in the office, followed by 2 week visit for mood evaluation, and then the 6 week PP visit. She will continue on her Labetalol 300 mg po Bid and Procardia XL 30 daily.     04/26/2022 Imagene Riches, CNM

## 2022-04-26 NOTE — Progress Notes (Signed)
Pt discharge information reviewed.  f/u appointments reviewed with pt. Pt verbally states understanding and questions answered. Infant accompanied pt placed into car seat by mother and escorted to personal vehicle via wheelchair. Infant placed into vehicle by parent.

## 2022-04-29 ENCOUNTER — Encounter: Payer: Self-pay | Admitting: Obstetrics

## 2022-04-29 ENCOUNTER — Encounter: Payer: Self-pay | Admitting: Obstetrics & Gynecology

## 2022-04-30 ENCOUNTER — Telehealth: Payer: Self-pay | Admitting: Licensed Practical Nurse

## 2022-04-30 ENCOUNTER — Ambulatory Visit: Payer: Medicaid Other | Admitting: Licensed Practical Nurse

## 2022-04-30 NOTE — Telephone Encounter (Signed)
Contacted pt to reschedule appt that was originally scheduled for  9/6 at 10:35. Pt is scheduled with JEG on 9/7 at 1:35.

## 2022-05-01 ENCOUNTER — Ambulatory Visit: Payer: Medicaid Other | Admitting: Advanced Practice Midwife

## 2022-05-01 ENCOUNTER — Telehealth: Payer: Self-pay | Admitting: Licensed Practical Nurse

## 2022-05-01 NOTE — Telephone Encounter (Signed)
Contacted pt to reschedule 9/7 appt for bp check with JEG.  Pt rescheduled for 9/11 at 2:55 with Dr. Eugenie Norrie.

## 2022-05-05 ENCOUNTER — Ambulatory Visit: Payer: Medicaid Other | Admitting: Obstetrics & Gynecology

## 2022-05-08 ENCOUNTER — Ambulatory Visit: Payer: Medicaid Other | Admitting: Licensed Practical Nurse

## 2022-05-15 ENCOUNTER — Other Ambulatory Visit: Payer: Self-pay | Admitting: Licensed Practical Nurse

## 2022-05-15 ENCOUNTER — Ambulatory Visit: Payer: Medicaid Other | Admitting: Licensed Practical Nurse

## 2022-05-15 ENCOUNTER — Encounter: Payer: Self-pay | Admitting: Licensed Practical Nurse

## 2022-05-15 DIAGNOSIS — F53 Postpartum depression: Secondary | ICD-10-CM

## 2022-05-15 MED ORDER — SERTRALINE HCL 50 MG PO TABS: 50.0000 mg | ORAL_TABLET | Freq: Every day | ORAL | 3 refills | Status: DC

## 2022-05-15 NOTE — Progress Notes (Signed)
TC to [employer] unable to make today's apt d/t her family needs.  Depression: Tylesha reports her mood has been down, she is struggling.  She only gets 4 hours of sleep, her children have required multiple doctors appointments and she is in school.  Her partner is supportive but he works 3rd shift, so is not available much.  They are currently trying to figure out a way for her to get more sleep. They do not have reliable family help. Grace [name] has been struggling with depression her whole pregnancy, she was prescribed Zoloft by this cnm, but never started it.  Today she desires to start Zoloft as she does not want to "get worse" denies SI.  Will start Zoloft 25mg   x 1 week then increase to 50mg  daily She has an apt with Behavioral Health next week   GHTN: Jahanna lost her medication so has not been on it for about a week, her BP's have been 130/90.  Will start Procardia 30mg  daily and return in 1 week for BO check  Hemorrhoids Rosalena recently went to use BR and felt soft tissue. It is not causing pain and she has not seen any bleeding.  Reviewed OTC products she can use.   Pt has PP visit on Oct 18, will reassess mood then  Roberto Scales, [place]  05/15/22  11:53 AM

## 2022-05-16 ENCOUNTER — Inpatient Hospital Stay (HOSPITAL_COMMUNITY): Admit: 2022-05-16 | Payer: Self-pay

## 2022-05-16 ENCOUNTER — Encounter: Payer: Self-pay | Admitting: Licensed Practical Nurse

## 2022-05-28 ENCOUNTER — Ambulatory Visit: Payer: Medicaid Other | Admitting: Licensed Practical Nurse

## 2022-05-28 NOTE — Progress Notes (Signed)
Attempted to call pt 3 times, no answer Front desk messaged to call pt and reschedule.  Roberto Scales, [place]  05/28/22  5:07 PM

## 2022-06-06 ENCOUNTER — Telehealth: Payer: Self-pay

## 2022-06-11 ENCOUNTER — Ambulatory Visit: Payer: Medicaid Other | Admitting: Licensed Practical Nurse

## 2022-06-11 ENCOUNTER — Telehealth: Payer: Self-pay | Admitting: Licensed Practical Nurse

## 2022-06-11 NOTE — Telephone Encounter (Signed)
Reached out to pt to reschedule pp check up that was scheduled with LMD at 9:35.  Left message for pt to call back to reschedule appt.

## 2022-06-12 ENCOUNTER — Encounter: Payer: Self-pay | Admitting: Licensed Practical Nurse

## 2022-06-12 NOTE — Telephone Encounter (Signed)
Reached out to pt (x2) to reschedule pp check up that was scheduled with LMD at 9:35.  Left message for pt to call back to reschedule appt.

## 2022-06-25 ENCOUNTER — Ambulatory Visit: Payer: Medicaid Other | Admitting: Licensed Practical Nurse

## 2022-07-08 ENCOUNTER — Ambulatory Visit (INDEPENDENT_AMBULATORY_CARE_PROVIDER_SITE_OTHER): Payer: Medicaid Other | Admitting: Licensed Practical Nurse

## 2022-07-08 ENCOUNTER — Encounter: Payer: Self-pay | Admitting: Licensed Practical Nurse

## 2022-07-08 DIAGNOSIS — M79602 Pain in left arm: Secondary | ICD-10-CM

## 2022-07-08 DIAGNOSIS — Z720 Tobacco use: Secondary | ICD-10-CM

## 2022-07-08 NOTE — Telephone Encounter (Signed)
Patient is scheduled for 07/08/22 at 2:15 with LMD
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
## 2022-07-12 ENCOUNTER — Encounter: Payer: Self-pay | Admitting: Licensed Practical Nurse

## 2022-07-12 MED ORDER — NICOTINE 14 MG/24HR TD PT24: 14.0000 mg | MEDICATED_PATCH | Freq: Every day | TRANSDERMAL | 0 refills | Status: AC

## 2022-07-16 NOTE — Patient Instructions (Incomplete)

## 2022-07-16 NOTE — Progress Notes (Deleted)
    NURSE VISIT NOTE  Subjective:    Patient ID: DELINA KRUCZEK, female    DOB: 2001-03-16, 21 y.o.   MRN: [id number]  HPI  Patient is a 21 y.o. [id number] female who presents for depo provera injection.   Objective:   currently breastfeeding. There is no height or weight on file to calculate BMI.  Date last pap: 08/28/2021. Last Depo-Provera: ?. Side Effects if any: None. Serum HCG indicated? N/A. Depo-Provera 150 mg IM given by: Rocco Serene, LPN.  @THIS  VISIT ONLY@  Assessment:   1. Encounter for Depo-Provera contraception      Plan:   Next appointment due between 10/06/22 and 10/20/22.    10/22/22, LPN

## 2022-07-21 ENCOUNTER — Ambulatory Visit: Payer: Medicaid Other

## 2022-07-21 DIAGNOSIS — Z3042 Encounter for surveillance of injectable contraceptive: Secondary | ICD-10-CM

## 2022-07-21 NOTE — Progress Notes (Deleted)
Date last pap: 08/28/2021. Last Depo-Provera: 04/26/2022. Side Effects if any: n/a. Serum HCG indicated? N/a. Depo-Provera 150 mg IM given by: Beverely Pace CMA. Next appointment due Feb 13-27.

## 2022-07-22 ENCOUNTER — Ambulatory Visit: Payer: Medicaid Other

## 2022-07-23 ENCOUNTER — Encounter: Payer: Self-pay | Admitting: Licensed Practical Nurse

## 2022-07-23 ENCOUNTER — Ambulatory Visit (INDEPENDENT_AMBULATORY_CARE_PROVIDER_SITE_OTHER): Payer: Medicaid Other

## 2022-07-23 VITALS — BP 126/72 | HR 99 | Ht 66.0 in | Wt 211.0 lb

## 2022-07-23 DIAGNOSIS — Z3042 Encounter for surveillance of injectable contraceptive: Secondary | ICD-10-CM | POA: Diagnosis not present

## 2022-07-23 MED ORDER — MEDROXYPROGESTERONE ACETATE 150 MG/ML IM SUSP
150.0000 mg | Freq: Once | INTRAMUSCULAR | Status: AC
  Administered 2022-07-23: 150 mg via INTRAMUSCULAR

## 2022-07-23 NOTE — Progress Notes (Signed)
    NURSE VISIT NOTE  Subjective:    Patient ID: Grace [name], female    DOB: 08/18/2001, 21 y.o.   MRN: [id number]  HPI  Patient is a 21 y.o. [id number] female who presents for depo provera injection.   Objective:    BP 126/72   Pulse 99   Ht 5\' 6"  (1.676 m)   Wt 211 lb (95.7 kg)   Breastfeeding Yes   BMI 34.06 kg/m    Date last pap: 08/28/21. Last Depo-Provera: 04/25/22. Side Effects if any: None. Serum HCG indicated? N/A. Depo-Provera 150 mg IM given by: 06/25/22, LPN. Site: Left Deltoid.    Assessment:   1. Encounter for Depo-Provera contraception      Plan:   Next appointment due between 10/08/22 and 10/12/22.    10/14/22, LPN

## 2022-08-28 ENCOUNTER — Ambulatory Visit: Payer: Medicaid Other | Attending: Licensed Practical Nurse

## 2022-10-03 ENCOUNTER — Encounter: Payer: Self-pay | Admitting: Licensed Practical Nurse

## 2022-10-03 NOTE — Progress Notes (Signed)
Unable to contact patient to schedule upper extremity ultrasound, letter sent, order cancelled

## 2022-10-23 ENCOUNTER — Telehealth: Payer: Medicaid Other | Admitting: Nurse Practitioner

## 2022-10-23 DIAGNOSIS — N3000 Acute cystitis without hematuria: Secondary | ICD-10-CM

## 2022-10-23 MED ORDER — CEPHALEXIN 500 MG PO CAPS: 500.0000 mg | ORAL_CAPSULE | Freq: Two times a day (BID) | ORAL | 0 refills | Status: AC

## 2022-10-23 NOTE — Progress Notes (Signed)
Virtual Visit Consent   Grace [name], you are scheduled for a virtual visit with a [facility] provider today. Just as with appointments in the office, your consent must be obtained to participate. Your consent will be active for this visit and any virtual visit you may have with one of our providers in the next 365 days. If you have a MyChart account, a copy of this consent can be sent to you electronically.  As this is a virtual visit, video technology does not allow for your provider to perform a traditional examination. This may limit your provider's ability to fully assess your condition. If your provider identifies any concerns that need to be evaluated in person or the need to arrange testing (such as labs, EKG, etc.), we will make arrangements to do so. Although advances in technology are sophisticated, we cannot ensure that it will always work on either your end or our end. If the connection with a video visit is poor, the visit may have to be switched to a telephone visit. With either a video or telephone visit, we are not always able to ensure that we have a secure connection.  By engaging in this virtual visit, you consent to the provision of healthcare and authorize for your insurance to be billed (if applicable) for the services provided during this visit. Depending on your insurance coverage, you may receive a charge related to this service.  I need to obtain your verbal consent now. Are you willing to proceed with your visit today? Grace [name] has provided verbal consent on 10/23/2022 for a virtual visit (video or telephone). Grace Schneiders, FNP  Date: 10/23/2022 12:55 PM  Virtual Visit via Video Note   I, Grace [name], connected with  Grace [name]  ([id number], [birth year]) on 10/23/22 at  1:00 PM EST by a video-enabled telemedicine application and verified that I am speaking with the correct person using two identifiers.  Location: Patient: Virtual Visit Location Patient:  Home Provider: Virtual Visit Location Provider: Home Office   I discussed the limitations of evaluation and management by telemedicine and the availability of in person appointments. The patient expressed understanding and agreed to proceed.    History of Present Illness: Grace [name] is a 22 y.o. who identifies as a female who was assigned female at birth, and is being seen today for UTI symptoms. Started yesterday with burning while urinating  She had increased urinary frequency   She was out of town the past weekend and was holding her urine during that time  She denies nausea/vomiting or back pain  No fever   She has had a UTI in the past without complication  She denies pregnancy she is breastfeeding   Problems:  Patient Active Problem List   Diagnosis Date Noted   Gestational hypertension affecting fourth pregnancy 04/23/2022   Encounter for Depo-Provera contraception 04/23/2022   NST (non-stress test) nonreactive 04/22/2022   Labor and delivery, indication for care 04/22/2022   Gestational hypertension w/o significant proteinuria in 3rd trimester    [redacted] weeks gestation of pregnancy    Headache in pregnancy 04/09/2022   Elevated blood pressure affecting pregnancy in third trimester, antepartum 04/01/2022   Headache in pregnancy, antepartum, third trimester 04/01/2022   [redacted] weeks gestation of pregnancy 04/01/2022   Pelvic pressure in pregnancy 03/28/2022   Proteinuria affecting pregnancy in third trimester 02/08/2022   Supervision of other normal pregnancy, antepartum 10/07/2021   Mood disorder ([place]) 11/15/2018   PTSD (post-traumatic  stress disorder) 11/15/2018   Major depression, recurrent, chronic ([place]) 08/06/2018   GAD (generalized anxiety disorder) 08/06/2018   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 Notify provider for blood pressures less than 90/50 or 160/110 or greater, Disp: 1 kit, Rfl: 0   Butalbital-APAP-Caffeine 50-325-40 MG capsule, Take 1-2 capsules by mouth every 6 (six) hours as needed for headache. (Patient not taking: Reported on 07/08/2022), Disp: 10 capsule, Rfl: 0   cetirizine (ZYRTEC) 10 MG tablet, Take by mouth., Disp: , Rfl:    labetalol (NORMODYNE) 300 MG tablet, Take 1 tablet (300 mg total) by mouth 2 (two) times daily. (Patient not taking: Reported on 07/08/2022), Disp: 60 tablet, Rfl: 1   montelukast (SINGULAIR) 10 MG tablet, TAKE ONE TABLET BY MOUTH EVERYDAY AT BEDTIME, Disp: 90 tablet, Rfl: 1   NIFEdipine (ADALAT CC) 30 MG 24 hr tablet, Take 1 tablet (30 mg total) by mouth daily. (Patient not taking: Reported on 07/23/2022), Disp: 30 tablet, Rfl: 1   Prenatal MV & Min w/FA-DHA (PRENATAL GUMMIES PO), Take by mouth., Disp: , Rfl:    sertraline (ZOLOFT) 50 MG tablet, Take 1 tablet (50 mg total) by mouth daily., Disp: 30 tablet, Rfl: 3   SYMBICORT 160-4.5 MCG/ACT inhaler, SMARTSIG:2 Puff(s) By Mouth Twice Daily, Disp: , Rfl:   Observations/Objective: Patient is well-developed, well-nourished in no acute distress.  Resting comfortably  at home.  Head is normocephalic, atraumatic.  No labored breathing.  Speech is clear and coherent with logical content.  Patient is alert and oriented at baseline.    Assessment and Plan: 1. Acute cystitis without hematuria Push water  - cephALEXin (KEFLEX) 500 MG capsule; Take 1 capsule (500 mg total) by mouth 2 (two) times daily for 7 days.  Dispense: 14 capsule; Refill: 0    Follow Up Instructions: I discussed the assessment and treatment plan with the patient. The patient was provided an opportunity to ask questions and all  were answered. The patient agreed with the plan and demonstrated an understanding of the instructions.  A copy of instructions were sent to the patient via MyChart unless otherwise noted below.    The patient was advised to call back or seek an in-person evaluation if the symptoms worsen or if the condition fails to improve as anticipated.  Time:  I spent 11 minutes with the patient via telehealth technology discussing the above problems/concerns.    Grace Schneiders, FNP

## 2022-11-03 ENCOUNTER — Ambulatory Visit (INDEPENDENT_AMBULATORY_CARE_PROVIDER_SITE_OTHER): Payer: Medicaid Other

## 2022-11-03 VITALS — BP 120/80 | Ht 66.0 in | Wt 219.0 lb

## 2022-11-03 DIAGNOSIS — Z3042 Encounter for surveillance of injectable contraceptive: Secondary | ICD-10-CM | POA: Diagnosis not present

## 2022-11-03 DIAGNOSIS — Z3202 Encounter for pregnancy test, result negative: Secondary | ICD-10-CM | POA: Diagnosis not present

## 2022-11-03 LAB — POCT URINE PREGNANCY: Preg Test, Ur: NEGATIVE

## 2022-11-03 MED ORDER — MEDROXYPROGESTERONE ACETATE 150 MG/ML IM SUSY
150.0000 mg | PREFILLED_SYRINGE | Freq: Once | INTRAMUSCULAR | Status: AC
  Administered 2022-11-03: 150 mg via INTRAMUSCULAR

## 2022-11-03 NOTE — Progress Notes (Signed)
Date last pap: 08/28/21. Last Depo-Provera: 07/23/22. Side Effects if any: none. Serum HCG indicated? negative. Depo-Provera 150 mg IM given by: Quintella Baton cma. Next appointment due 01/19/23-02/02/23.

## 2022-11-22 ENCOUNTER — Telehealth: Payer: Medicaid Other

## 2022-12-01 ENCOUNTER — Ambulatory Visit: Payer: Medicaid Other | Admitting: Licensed Practical Nurse

## 2022-12-08 ENCOUNTER — Telehealth: Payer: Medicaid Other | Admitting: Physician Assistant

## 2022-12-08 DIAGNOSIS — A084 Viral intestinal infection, unspecified: Secondary | ICD-10-CM | POA: Diagnosis not present

## 2022-12-08 MED ORDER — ONDANSETRON 4 MG PO TBDP: 4.0000 mg | ORAL_TABLET | Freq: Three times a day (TID) | ORAL | 0 refills | Status: DC | PRN

## 2022-12-08 NOTE — Progress Notes (Signed)
Virtual Visit Consent   Grace [name], you are scheduled for a virtual visit with a [facility] provider today. Just as with appointments in the office, your consent must be obtained to participate. Your consent will be active for this visit and any virtual visit you may have with one of our providers in the next 365 days. If you have a MyChart account, a copy of this consent can be sent to you electronically.  As this is a virtual visit, video technology does not allow for your provider to perform a traditional examination. This may limit your provider's ability to fully assess your condition. If your provider identifies any concerns that need to be evaluated in person or the need to arrange testing (such as labs, EKG, etc.), we will make arrangements to do so. Although advances in technology are sophisticated, we cannot ensure that it will always work on either your end or our end. If the connection with a video visit is poor, the visit may have to be switched to a telephone visit. With either a video or telephone visit, we are not always able to ensure that we have a secure connection.  By engaging in this virtual visit, you consent to the provision of healthcare and authorize for your insurance to be billed (if applicable) for the services provided during this visit. Depending on your insurance coverage, you may receive a charge related to this service.  I need to obtain your verbal consent now. Are you willing to proceed with your visit today? Grace [name] has provided verbal consent on 12/08/2022 for a virtual visit (video or telephone). Grace [name], [place]  Date: 12/08/2022 6:47 PM  Virtual Visit via Video Note   I, Grace [name], connected with  Grace [name]  ([id number], 2000-12-08) on 12/08/22 at  6:45 PM EDT by a video-enabled telemedicine application and verified that I am speaking with the correct person using two identifiers.  Location: Patient: Virtual Visit Location  Patient: Home Provider: Virtual Visit Location Provider: Home Office   I discussed the limitations of evaluation and management by telemedicine and the availability of in person appointments. The patient expressed understanding and agreed to proceed.    History of Present Illness: Grace [name] is a 22 y.o. who identifies as a female who was assigned female at birth, and is being seen today for GI symptoms starting overnight last night with cramping, fatigue, diarrhea. Notes bowel movements about every hour or so and anytime, right after she tries to eat. Denies blood in the stool. Denies hematemesis. Denies fever. Has been able to hydrate. Notes her children just got over this illness.   OTC -- Taken 2 Petpo Bismol tablets which helped slightly.   HPI: HPI  Problems:  Patient Active Problem List   Diagnosis Date Noted   Gestational hypertension affecting fourth pregnancy 04/23/2022   Encounter for Depo-Provera contraception 04/23/2022   NST (non-stress test) nonreactive 04/22/2022   Labor and delivery, indication for care 04/22/2022   Gestational hypertension w/o significant proteinuria in 3rd trimester    [redacted] weeks gestation of pregnancy    Headache in pregnancy 04/09/2022   Elevated blood pressure affecting pregnancy in third trimester, antepartum 04/01/2022   Headache in pregnancy, antepartum, third trimester 04/01/2022   [redacted] weeks gestation of pregnancy 04/01/2022   Pelvic pressure in pregnancy 03/28/2022   Proteinuria affecting pregnancy in third trimester 02/08/2022   Supervision of other normal pregnancy, antepartum 10/07/2021   Mood disorder 11/15/2018  PTSD (post-traumatic stress disorder) 11/15/2018   Major depression, recurrent, chronic 08/06/2018   GAD (generalized anxiety disorder) 08/06/2018   Allergy to nuts 09/15/2016   Chronic allergic rhinitis 12/20/2015   Primary dysmenorrhea 12/20/2015    Allergies: No Known Allergies Medications:  Current Outpatient  Medications:    ondansetron (ZOFRAN-ODT) 4 MG disintegrating tablet, Take 1 tablet (4 mg total) by mouth every 8 (eight) hours as needed for nausea or vomiting., Disp: 20 tablet, Rfl: 0   albuterol (VENTOLIN HFA) 108 (90 Base) MCG/ACT inhaler, Inhale 2 puffs into the lungs every 6 (six) hours as needed for wheezing or shortness of breath., Disp: 8 g, Rfl: 0   Blood Pressure KIT, 1 kit by Does not apply route daily. Notify provider for blood pressures less than 90/50 or 160/110 or greater, Disp: 1 kit, Rfl: 0   Butalbital-APAP-Caffeine 50-325-40 MG capsule, Take 1-2 capsules by mouth every 6 (six) hours as needed for headache., Disp: 10 capsule, Rfl: 0   cetirizine (ZYRTEC) 10 MG tablet, Take by mouth., Disp: , Rfl:    labetalol (NORMODYNE) 300 MG tablet, Take 1 tablet (300 mg total) by mouth 2 (two) times daily., Disp: 60 tablet, Rfl: 1   medroxyPROGESTERone (DEPO-PROVERA) 150 MG/ML injection, Inject 150 mg into the muscle every 3 (three) months., Disp: , Rfl:    montelukast (SINGULAIR) 10 MG tablet, TAKE ONE TABLET BY MOUTH EVERYDAY AT BEDTIME, Disp: 90 tablet, Rfl: 1   NIFEdipine (ADALAT CC) 30 MG 24 hr tablet, Take 1 tablet (30 mg total) by mouth daily., Disp: 30 tablet, Rfl: 1   Prenatal MV & Min w/FA-DHA (PRENATAL GUMMIES PO), Take by mouth., Disp: , Rfl:    sertraline (ZOLOFT) 50 MG tablet, Take 1 tablet (50 mg total) by mouth daily., Disp: 30 tablet, Rfl: 3   SYMBICORT 160-4.5 MCG/ACT inhaler, SMARTSIG:2 Puff(s) By Mouth Twice Daily, Disp: , Rfl:   Observations/Objective: Patient is well-developed, well-nourished in no acute distress.  Resting comfortably at home.  Head is normocephalic, atraumatic.  No labored breathing. Speech is clear and coherent with logical content.  Patient is alert and oriented at baseline.   Assessment and Plan: 1. Viral gastroenteritis - ondansetron (ZOFRAN-ODT) 4 MG disintegrating tablet; Take 1 tablet (4 mg total) by mouth every 8 (eight) hours as needed  for nausea or vomiting.  Dispense: 20 tablet; Refill: 0  Mild. Afebrile. No alarm signs/symptoms present. Supportive measures and OTC medications reviewed. Will place Zofran on file if needed for development of nausea or emesis. BRAT diet reviewed -- handout sent via MyChart with her instructions. Work note provided. Strict ER precautions reviewed with patient.   Follow Up Instructions: I discussed the assessment and treatment plan with the patient. The patient was provided an opportunity to ask questions and all were answered. The patient agreed with the plan and demonstrated an understanding of the instructions.  A copy of instructions were sent to the patient via MyChart unless otherwise noted below.   The patient was advised to call back or seek an in-person evaluation if the symptoms worsen or if the condition fails to improve as anticipated.  Time:  I spent 10 minutes with the patient via telehealth technology discussing the above problems/concerns.    Grace Climes, PA-C

## 2022-12-08 NOTE — Patient Instructions (Signed)
[name], thank you for joining [name], PA-C for today's virtual visit.  While this provider is not your primary care provider (PCP), if your PCP is located in our provider database this encounter information will be shared with them immediately following your visit.   A [facility] MyChart account gives you access to today's visit and all your visits, tests, and labs performed at [facility] " click here if you don't have a [facility] MyChart account or go to [email or web address]  Consent: (Patient) Grace [name] provided verbal consent for this virtual visit at the beginning of the encounter.  Current Medications:  Current Outpatient Medications:    albuterol (VENTOLIN HFA) 108 (90 Base) MCG/ACT inhaler, Inhale 2 puffs into the lungs every 6 (six) hours as needed for wheezing or shortness of breath., Disp: 8 g, Rfl: 0   Blood Pressure KIT, 1 kit by Does not apply route daily. Notify provider for blood pressures less than 90/50 or 160/110 or greater, Disp: 1 kit, Rfl: 0   Butalbital-APAP-Caffeine 50-325-40 MG capsule, Take 1-2 capsules by mouth every 6 (six) hours as needed for headache., Disp: 10 capsule, Rfl: 0   cetirizine (ZYRTEC) 10 MG tablet, Take by mouth., Disp: , Rfl:    labetalol (NORMODYNE) 300 MG tablet, Take 1 tablet (300 mg total) by mouth 2 (two) times daily., Disp: 60 tablet, Rfl: 1   medroxyPROGESTERone (DEPO-PROVERA) 150 MG/ML injection, Inject 150 mg into the muscle every 3 (three) months., Disp: , Rfl:    montelukast (SINGULAIR) 10 MG tablet, TAKE ONE TABLET BY MOUTH EVERYDAY AT BEDTIME, Disp: 90 tablet, Rfl: 1   NIFEdipine (ADALAT CC) 30 MG 24 hr tablet, Take 1 tablet (30 mg total) by mouth daily., Disp: 30 tablet, Rfl: 1   Prenatal MV & Min w/FA-DHA (PRENATAL GUMMIES PO), Take by mouth., Disp: , Rfl:    sertraline (ZOLOFT) 50 MG tablet, Take 1 tablet (50 mg total) by mouth daily., Disp: 30 tablet, Rfl: 3   SYMBICORT 160-4.5 MCG/ACT  inhaler, SMARTSIG:2 Puff(s) By Mouth Twice Daily, Disp: , Rfl:    Medications ordered in this encounter:  No orders of the defined types were placed in this encounter.    *If you need refills on other medications prior to your next appointment, please contact your pharmacy*  Follow-Up: Call back or seek an in-person evaluation if the symptoms worsen or if the condition fails to improve as anticipated.  [facility] [phone]  Other Instructions Please keep well-hydrated and get plenty of rest. Follow dietary recommendations below. You can use Imodium OTC if needed. I have placed a script for Zofran at the pharmacy just in case you develop nausea or vomiting. If anything acutely worsens and you are unable to keep fluids in, or any severe pain, you need an ER evaluation ASAP. DO NOT DELAY CARE!   If you have been instructed to have an in-person evaluation today at a local Urgent Care facility, please use the link below. It will take you to a list of all of our available [facility], including address, phone number and hours of operation. Please do not delay care.  [facility]  If you or a family member do not have a primary care provider, use the link below to schedule a visit and establish care. When you choose a [facility] primary care physician or advanced practice provider, you gain a long-term partner in health. Find a Primary Care Provider  Learn more about [facility]'s in-office and virtual care options: [facility]

## 2022-12-24 NOTE — Telephone Encounter (Signed)
Error

## 2022-12-28 ENCOUNTER — Telehealth: Payer: Medicaid Other | Admitting: Nurse Practitioner

## 2022-12-28 DIAGNOSIS — J302 Other seasonal allergic rhinitis: Secondary | ICD-10-CM | POA: Diagnosis not present

## 2022-12-28 MED ORDER — CETIRIZINE HCL 10 MG PO TABS
10.0000 mg | ORAL_TABLET | Freq: Every day | ORAL | 0 refills | Status: DC
Start: 2022-12-28 — End: 2024-07-15

## 2022-12-28 MED ORDER — OLOPATADINE HCL 0.1 % OP SOLN: 1.0000 [drp] | Freq: Two times a day (BID) | OPHTHALMIC | 0 refills | Status: DC

## 2022-12-28 MED ORDER — MONTELUKAST SODIUM 10 MG PO TABS: 10.0000 mg | ORAL_TABLET | Freq: Every day | ORAL | 0 refills | Status: DC

## 2022-12-28 MED ORDER — AZELASTINE HCL 0.05 % OP SOLN: 1.0000 [drp] | Freq: Two times a day (BID) | OPHTHALMIC | 0 refills | Status: DC

## 2022-12-28 NOTE — Progress Notes (Signed)
Virtual Visit Consent   Grace [name], you are scheduled for a virtual visit with a [facility] provider today. Just as with appointments in the office, your consent must be obtained to participate. Your consent will be active for this visit and any virtual visit you may have with one of our providers in the next 365 days. If you have a MyChart account, a copy of this consent can be sent to you electronically.  As this is a virtual visit, video technology does not allow for your provider to perform a traditional examination. This may limit your provider's ability to fully assess your condition. If your provider identifies any concerns that need to be evaluated in person or the need to arrange testing (such as labs, EKG, etc.), we will make arrangements to do so. Although advances in technology are sophisticated, we cannot ensure that it will always work on either your end or our end. If the connection with a video visit is poor, the visit may have to be switched to a telephone visit. With either a video or telephone visit, we are not always able to ensure that we have a secure connection.  By engaging in this virtual visit, you consent to the provision of healthcare and authorize for your insurance to be billed (if applicable) for the services provided during this visit. Depending on your insurance coverage, you may receive a charge related to this service.  I need to obtain your verbal consent now. Are you willing to proceed with your visit today? BOWIE LUSH has provided verbal consent on 12/28/2022 for a virtual visit (video or telephone). Claiborne Rigg, NP  Date: 12/28/2022 1:07 PM  Virtual Visit via Video Note   I, Claiborne Rigg, connected with  Grace [name]  ([id number], 09-05-2000) on 12/28/22 at  1:00 PM EDT by a video-enabled telemedicine application and verified that I am speaking with the correct person using two identifiers.  Location: Patient: Virtual Visit Location Patient:  Home Provider: Virtual Visit Location Provider: Home Office   I discussed the limitations of evaluation and management by telemedicine and the availability of in person appointments. The patient expressed understanding and agreed to proceed.    History of Present Illness: Grace [name] is a 22 y.o. who identifies as a female who was assigned female at birth, and is being seen today for seasonal allergies with allergic conjunctivitis.  Ms. Lute notes itching and irritation of both eyes. Denies crusting or matting. Feels like something is in her right eye and despite using clear eyes she continues to feel like there is something sandy in her eye. No visual disturbances.    Problems:  Patient Active Problem List   Diagnosis Date Noted   Gestational hypertension affecting fourth pregnancy 04/23/2022   Encounter for Depo-Provera contraception 04/23/2022   NST (non-stress test) nonreactive 04/22/2022   Labor and delivery, indication for care 04/22/2022   Gestational hypertension w/o significant proteinuria in 3rd trimester    [redacted] weeks gestation of pregnancy    Headache in pregnancy 04/09/2022   Elevated blood pressure affecting pregnancy in third trimester, antepartum 04/01/2022   Headache in pregnancy, antepartum, third trimester 04/01/2022   [redacted] weeks gestation of pregnancy 04/01/2022   Pelvic pressure in pregnancy 03/28/2022   Proteinuria affecting pregnancy in third trimester 02/08/2022   Supervision of other normal pregnancy, antepartum 10/07/2021   Mood disorder (HCC) 11/15/2018   PTSD (post-traumatic stress disorder) 11/15/2018   Major depression, recurrent, chronic (HCC) 08/06/2018  GAD (generalized anxiety disorder) 08/06/2018   Allergy to nuts 09/15/2016   Chronic allergic rhinitis 12/20/2015   Primary dysmenorrhea 12/20/2015    Allergies: No Known Allergies Medications:  Current Outpatient Medications:    azelastine (OPTIVAR) 0.05 % ophthalmic solution, Apply 1 drop to eye  2 (two) times daily., Disp: 6 mL, Rfl: 0   olopatadine (PATADAY) 0.1 % ophthalmic solution, Place 1 drop into both eyes 2 (two) times daily., Disp: 5 mL, Rfl: 0   albuterol (VENTOLIN HFA) 108 (90 Base) MCG/ACT inhaler, Inhale 2 puffs into the lungs every 6 (six) hours as needed for wheezing or shortness of breath., Disp: 8 g, Rfl: 0   Blood Pressure KIT, 1 kit by Does not apply route daily. Notify provider for blood pressures less than 90/50 or 160/110 or greater, Disp: 1 kit, Rfl: 0   Butalbital-APAP-Caffeine 50-325-40 MG capsule, Take 1-2 capsules by mouth every 6 (six) hours as needed for headache., Disp: 10 capsule, Rfl: 0   cetirizine (ZYRTEC) 10 MG tablet, Take 1 tablet (10 mg total) by mouth daily., Disp: 30 tablet, Rfl: 0   labetalol (NORMODYNE) 300 MG tablet, Take 1 tablet (300 mg total) by mouth 2 (two) times daily., Disp: 60 tablet, Rfl: 1   medroxyPROGESTERone (DEPO-PROVERA) 150 MG/ML injection, Inject 150 mg into the muscle every 3 (three) months., Disp: , Rfl:    montelukast (SINGULAIR) 10 MG tablet, Take 1 tablet (10 mg total) by mouth at bedtime., Disp: 30 tablet, Rfl: 0   NIFEdipine (ADALAT CC) 30 MG 24 hr tablet, Take 1 tablet (30 mg total) by mouth daily., Disp: 30 tablet, Rfl: 1   ondansetron (ZOFRAN-ODT) 4 MG disintegrating tablet, Take 1 tablet (4 mg total) by mouth every 8 (eight) hours as needed for nausea or vomiting., Disp: 20 tablet, Rfl: 0   Prenatal MV & Min w/FA-DHA (PRENATAL GUMMIES PO), Take by mouth., Disp: , Rfl:    sertraline (ZOLOFT) 50 MG tablet, Take 1 tablet (50 mg total) by mouth daily., Disp: 30 tablet, Rfl: 3   SYMBICORT 160-4.5 MCG/ACT inhaler, SMARTSIG:2 Puff(s) By Mouth Twice Daily, Disp: , Rfl:   Observations/Objective: Patient is well-developed, well-nourished in no acute distress.  Resting comfortably at home.  Head is normocephalic, atraumatic.  No labored breathing.  Speech is clear and coherent with logical content.  Patient is alert and  oriented at baseline.  No injected sclera on exam today  Assessment and Plan: 1. Seasonal allergies - olopatadine (PATADAY) 0.1 % ophthalmic solution; Place 1 drop into both eyes 2 (two) times daily.  Dispense: 5 mL; Refill: 0 - cetirizine (ZYRTEC) 10 MG tablet; Take 1 tablet (10 mg total) by mouth daily.  Dispense: 30 tablet; Refill: 0 - azelastine (OPTIVAR) 0.05 % ophthalmic solution; Apply 1 drop to eye 2 (two) times daily.  Dispense: 6 mL; Refill: 0 - montelukast (SINGULAIR) 10 MG tablet; Take 1 tablet (10 mg total) by mouth at bedtime.  Dispense: 30 tablet; Refill: 0   Follow Up Instructions: I discussed the assessment and treatment plan with the patient. The patient was provided an opportunity to ask questions and all were answered. The patient agreed with the plan and demonstrated an understanding of the instructions.  A copy of instructions were sent to the patient via MyChart unless otherwise noted below.     The patient was advised to call back or seek an in-person evaluation if the symptoms worsen or if the condition fails to improve as anticipated.  Time:  I spent  12 minutes with the patient via telehealth technology discussing the above problems/concerns.    Claiborne Rigg, NP

## 2022-12-28 NOTE — Patient Instructions (Signed)
[name], thank you for joining Claiborne Rigg, NP for today's virtual visit.  While this provider is not your primary care provider (PCP), if your PCP is located in our provider database this encounter information will be shared with them immediately following your visit.   A [facility] MyChart account gives you access to today's visit and all your visits, tests, and labs performed at [facility] " click here if you don't have a [facility] MyChart account or go to [email or web address]  Consent: (Patient) Grace [name] provided verbal consent for this virtual visit at the beginning of the encounter.  Current Medications:  Current Outpatient Medications:    azelastine (OPTIVAR) 0.05 % ophthalmic solution, Apply 1 drop to eye 2 (two) times daily., Disp: 6 mL, Rfl: 0   olopatadine (PATADAY) 0.1 % ophthalmic solution, Place 1 drop into both eyes 2 (two) times daily., Disp: 5 mL, Rfl: 0   albuterol (VENTOLIN HFA) 108 (90 Base) MCG/ACT inhaler, Inhale 2 puffs into the lungs every 6 (six) hours as needed for wheezing or shortness of breath., Disp: 8 g, Rfl: 0   Blood Pressure KIT, 1 kit by Does not apply route daily. Notify provider for blood pressures less than 90/50 or 160/110 or greater, Disp: 1 kit, Rfl: 0   Butalbital-APAP-Caffeine 50-325-40 MG capsule, Take 1-2 capsules by mouth every 6 (six) hours as needed for headache., Disp: 10 capsule, Rfl: 0   cetirizine (ZYRTEC) 10 MG tablet, Take 1 tablet (10 mg total) by mouth daily., Disp: 30 tablet, Rfl: 0   labetalol (NORMODYNE) 300 MG tablet, Take 1 tablet (300 mg total) by mouth 2 (two) times daily., Disp: 60 tablet, Rfl: 1   medroxyPROGESTERone (DEPO-PROVERA) 150 MG/ML injection, Inject 150 mg into the muscle every 3 (three) months., Disp: , Rfl:    montelukast (SINGULAIR) 10 MG tablet, Take 1 tablet (10 mg total) by mouth at bedtime., Disp: 30 tablet, Rfl: 0   NIFEdipine (ADALAT CC) 30 MG 24 hr tablet, Take 1 tablet (30  mg total) by mouth daily., Disp: 30 tablet, Rfl: 1   ondansetron (ZOFRAN-ODT) 4 MG disintegrating tablet, Take 1 tablet (4 mg total) by mouth every 8 (eight) hours as needed for nausea or vomiting., Disp: 20 tablet, Rfl: 0   Prenatal MV & Min w/FA-DHA (PRENATAL GUMMIES PO), Take by mouth., Disp: , Rfl:    sertraline (ZOLOFT) 50 MG tablet, Take 1 tablet (50 mg total) by mouth daily., Disp: 30 tablet, Rfl: 3   SYMBICORT 160-4.5 MCG/ACT inhaler, SMARTSIG:2 Puff(s) By Mouth Twice Daily, Disp: , Rfl:    Medications ordered in this encounter:  Meds ordered this encounter  Medications   olopatadine (PATADAY) 0.1 % ophthalmic solution    Sig: Place 1 drop into both eyes 2 (two) times daily.    Dispense:  5 mL    Refill:  0    Fill pataday or optivar based on cheaper price.    Order Specific Question:   Supervising Provider    Answer:   Merrilee Jansky [[id number]]   cetirizine (ZYRTEC) 10 MG tablet    Sig: Take 1 tablet (10 mg total) by mouth daily.    Dispense:  30 tablet    Refill:  0    Order Specific Question:   Supervising Provider    Answer:   Merrilee Jansky [[id number]]   azelastine (OPTIVAR) 0.05 % ophthalmic solution    Sig: Apply 1 drop to eye 2 (two) times daily.  Dispense:  6 mL    Refill:  0    Fill pataday or optivar based on cheaper price.    Order Specific Question:   Supervising Provider    Answer:   Merrilee Jansky [[id number]]   montelukast (SINGULAIR) 10 MG tablet    Sig: Take 1 tablet (10 mg total) by mouth at bedtime.    Dispense:  30 tablet    Refill:  0    This prescription was filled on 01/21/2021. Any refills authorized will be placed on file.    Order Specific Question:   Supervising Provider    Answer:   Merrilee Jansky [[id number]]     *If you need refills on other medications prior to your next appointment, please contact your pharmacy*  Follow-Up: Call back or seek an in-person evaluation if the symptoms worsen or if the condition fails to improve as  anticipated.  [facility] [phone]  If you have been instructed to have an in-person evaluation today at a local Urgent Care facility, please use the link below. It will take you to a list of all of our available [facility], including address, phone number and hours of operation. Please do not delay care.  [facility]  If you or a family member do not have a primary care provider, use the link below to schedule a visit and establish care. When you choose a [facility] primary care physician or advanced practice provider, you gain a long-term partner in health. Find a Primary Care Provider  Learn more about [facility]'s in-office and virtual care options: [facility] - Get Care Now

## 2023-01-08 ENCOUNTER — Ambulatory Visit: Payer: Medicaid Other | Admitting: Licensed Practical Nurse

## 2023-02-02 ENCOUNTER — Ambulatory Visit: Payer: Medicaid Other

## 2023-02-24 ENCOUNTER — Other Ambulatory Visit (HOSPITAL_COMMUNITY)
Admission: RE | Admit: 2023-02-24 | Discharge: 2023-02-24 | Disposition: A | Discharge status: Home / Self Care | Payer: MEDICAID | Source: Ambulatory Visit | Attending: Licensed Practical Nurse | Admitting: Licensed Practical Nurse

## 2023-02-24 ENCOUNTER — Encounter: Payer: Self-pay | Admitting: Licensed Practical Nurse

## 2023-02-24 ENCOUNTER — Ambulatory Visit: Payer: MEDICAID | Admitting: Licensed Practical Nurse

## 2023-02-24 VITALS — BP 133/84 | HR 100 | Wt 221.0 lb

## 2023-02-24 DIAGNOSIS — Z01419 Encounter for gynecological examination (general) (routine) without abnormal findings: Secondary | ICD-10-CM | POA: Diagnosis present

## 2023-02-24 DIAGNOSIS — Z124 Encounter for screening for malignant neoplasm of cervix: Secondary | ICD-10-CM | POA: Diagnosis present

## 2023-02-24 DIAGNOSIS — Z3202 Encounter for pregnancy test, result negative: Secondary | ICD-10-CM

## 2023-02-24 DIAGNOSIS — Z30017 Encounter for initial prescription of implantable subdermal contraceptive: Secondary | ICD-10-CM

## 2023-02-24 LAB — POCT URINE PREGNANCY: Preg Test, Ur: NEGATIVE

## 2023-02-24 MED ORDER — ETONOGESTREL 68 MG ~~LOC~~ IMPL
68.0000 mg | DRUG_IMPLANT | Freq: Once | SUBCUTANEOUS | Status: AC
  Administered 2023-02-24: 68 mg via SUBCUTANEOUS
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
## 2023-02-25 ENCOUNTER — Encounter: Payer: Self-pay | Admitting: Licensed Practical Nurse

## 2023-02-25 LAB — CBC
Hematocrit: 39.5 % (ref 34.0–46.6)
Hemoglobin: 12.8 g/dL (ref 11.1–15.9)
MCH: 27 pg (ref 26.6–33.0)
MCHC: 32.4 g/dL (ref 31.5–35.7)
MCV: 83 fL (ref 79–97)
Platelets: 388 10*3/uL (ref 150–450)
RBC: 4.74 x10E6/uL (ref 3.77–5.28)
RDW: 13.1 % (ref 11.7–15.4)
WBC: 7.1 10*3/uL (ref 3.4–10.8)

## 2023-02-25 LAB — TSH+FREE T4
Free T4: 1.14 ng/dL (ref 0.82–1.77)
TSH: 2.51 u[IU]/mL (ref 0.450–4.500)

## 2023-02-25 LAB — HEMOGLOBIN A1C
Est. average glucose Bld gHb Est-mCnc: 111 mg/dL
Hgb A1c MFr Bld: 5.5 % (ref 4.8–5.6)

## 2023-02-25 LAB — HEP, RPR, HIV PANEL
HIV Screen 4th Generation wRfx: NONREACTIVE
Hepatitis B Surface Ag: NEGATIVE
RPR Ser Ql: NONREACTIVE

## 2023-03-02 LAB — CYTOLOGY - PAP
Chlamydia: NEGATIVE
Comment: NEGATIVE
Comment: NORMAL
Diagnosis: NEGATIVE
Neisseria Gonorrhea: NEGATIVE

## 2023-04-30 NOTE — Progress Notes (Signed)
This encounter was created in error - please disregard.

## 2023-06-30 ENCOUNTER — Ambulatory Visit
Admission: EM | Admit: 2023-06-30 | Discharge: 2023-06-30 | Disposition: A | Discharge status: Home / Self Care | Payer: MEDICAID | Attending: Physician Assistant | Admitting: Physician Assistant

## 2023-06-30 ENCOUNTER — Other Ambulatory Visit: Payer: Self-pay

## 2023-06-30 DIAGNOSIS — J029 Acute pharyngitis, unspecified: Secondary | ICD-10-CM | POA: Diagnosis not present

## 2023-06-30 DIAGNOSIS — J069 Acute upper respiratory infection, unspecified: Secondary | ICD-10-CM | POA: Diagnosis present

## 2023-06-30 DIAGNOSIS — R051 Acute cough: Secondary | ICD-10-CM | POA: Insufficient documentation

## 2023-06-30 DIAGNOSIS — R0981 Nasal congestion: Secondary | ICD-10-CM | POA: Insufficient documentation

## 2023-06-30 LAB — GROUP A STREP BY PCR: Group A Strep by PCR: NOT DETECTED

## 2023-06-30 LAB — RAPID INFLUENZA A&B ANTIGENS
Influenza A (ARMC): NEGATIVE
Influenza B (ARMC): NEGATIVE

## 2023-06-30 NOTE — Discharge Instructions (Signed)
URI/COLD SYMPTOMS: Your exam today is consistent with a viral illness. Antibiotics are not indicated at this time. Use medications as directed, including cough syrup, nasal saline, and decongestants. Your symptoms should improve over the next few days and resolve within 7-10 days. Increase rest and fluids. F/u if symptoms worsen or predominate such as sore throat, ear pain, productive cough, shortness of breath, or if you develop high fevers or worsening fatigue over the next several days.    Flu test is negative.  We will contact you if your strep is positive and send antibiotics to pharmacy.

## 2023-06-30 NOTE — ED Triage Notes (Addendum)
Pt c/o production cough, congestion and sore throat that on Saturday night. Pt states children have been sick with flu, paraflu and rhino since mid October. Pt states dx with ear infection in the beginning of October and was on abx and now c/o bilat ear pain and believes ear infection is back.

## 2023-06-30 NOTE — ED Provider Notes (Signed)
[facility]    CSN: [id number] Arrival date & time: 06/30/23  1252      History   Chief Complaint Chief Complaint  Patient presents with   Cough   Nasal Congestion    HPI Grace [name] is a 22 y.o. female presenting for 3 day history of cough, congestion and sore throat. Her children have recently been ill with the flu.   Patient says she had an ear infection about a month ago and was treated with antibiotics. Has been experiencing bilateral ear pain for the past couple of days.   Denies fever, fatigue, chest pain, SOB, abdominal pain, n/v/d.   Patient is currently breastfeeding. Taking Tylenol and Motrin. Medical history significant for asthma, allergies, anxiety, and depression.   HPI  Past Medical History:  Diagnosis Date   Acne    Acute nonintractable headache 06/12/2020   Allergy    ALLERGIC RHINITIS   Anxiety    Asthma    Deliberate self-cutting    Depression    Elevated blood pressure affecting pregnancy in third trimester, antepartum 04/01/2022   Placed on labetalol 100 mg po Bid. On 04/14/2022 at a visit to the [facility] triage with c/o headache and higher Bps, her dosage of Labetalol was increased to 200 mg po BID.   Fibrocystic disease of breast    Gestational hypertension affecting fourth pregnancy 04/23/2022   Gestational hypertension w/o significant proteinuria in 3rd trimester    Head pain    History of depression 12/20/2015   History of self-harm    Knee pain, right    Primary dysmenorrhea    Proteinuria affecting pregnancy in third trimester 02/08/2022   6/13 UPC=371mg    Severe myopia of both eyes    Supervision of other normal pregnancy, antepartum 10/07/2021          Nursing Staff  Provider  Office Location   [facility]  Dating   [redacted]w[redacted]d on 2/7  Language   English   Anatomy US   Normal   Flu Vaccine      Genetic Screen   NIPS: neg, XX  TDaP vaccine      Hgb A1C or   GTT  Early :5.3  Third trimester :   Covid        LAB RESULTS   Rhogam   NA   Blood Type  A/Positive/-- (02/24 1114)   Feeding Plan     Antibody  Negative (02/24 1114)  Contraception     Rubella      Patient Active Problem List   Diagnosis Date Noted   Encounter for Depo-Provera contraception 04/23/2022   Mood disorder (HCC) 11/15/2018   PTSD (post-traumatic stress disorder) 11/15/2018   Major depression, recurrent, chronic (HCC) 08/06/2018   GAD (generalized anxiety disorder) 08/06/2018   Allergy to nuts 09/15/2016   Chronic allergic rhinitis 12/20/2015   Primary dysmenorrhea 12/20/2015    Past Surgical History:  Procedure Laterality Date   LAPAROSCOPY  05/07/2017   Procedure: LAPAROSCOPY DIAGNOSTIC;  Surgeon: Suzy Bouchard, MD;  Location: [facility] ORS;  Service: Gynecology;;    OB History     Gravida  4   Para  2   Term  1   Preterm  1   AB  2   Living  2      SAB  2   IAB  0   Ectopic  0   Multiple  0   Live Births  2  Home Medications    Prior to Admission medications   Medication Sig Start Date End Date Taking? Authorizing Provider  albuterol (VENTOLIN HFA) 108 (90 Base) MCG/ACT inhaler Inhale 2 puffs into the lungs every 6 (six) hours as needed for wheezing or shortness of breath. 04/27/21   Demetrio Lapping, PA-C  azelastine (OPTIVAR) 0.05 % ophthalmic solution Apply 1 drop to eye 2 (two) times daily. Patient not taking: Reported on 02/24/2023 12/28/22   Claiborne Rigg, NP  Blood Pressure KIT 1 kit by Does not apply route daily. Notify provider for blood pressures less than 90/50 or 160/110 or greater Patient not taking: Reported on 02/24/2023 07/28/20   Gustavo Lah, CNM  Butalbital-APAP-Caffeine [phone] MG capsule Take 1-2 capsules by mouth every 6 (six) hours as needed for headache. Patient not taking: Reported on 02/24/2023 04/17/22   Mirna Mires, CNM  cetirizine (ZYRTEC) 10 MG tablet Take 1 tablet (10 mg total) by mouth daily. 12/28/22   Claiborne Rigg, NP  labetalol (NORMODYNE) 300 MG tablet Take 1  tablet (300 mg total) by mouth 2 (two) times daily. Patient not taking: Reported on 02/24/2023 04/26/22   Mirna Mires, CNM  medroxyPROGESTERone (DEPO-PROVERA) 150 MG/ML injection Inject 150 mg into the muscle every 3 (three) months. Patient not taking: Reported on 02/24/2023    [provider]  montelukast (SINGULAIR) 10 MG tablet Take 1 tablet (10 mg total) by mouth at bedtime. 12/28/22 01/27/23  Claiborne Rigg, NP  NIFEdipine (ADALAT CC) 30 MG 24 hr tablet Take 1 tablet (30 mg total) by mouth daily. 04/26/22   Glenetta Borg, CNM  olopatadine (PATADAY) 0.1 % ophthalmic solution Place 1 drop into both eyes 2 (two) times daily. 12/28/22   Claiborne Rigg, NP  ondansetron (ZOFRAN-ODT) 4 MG disintegrating tablet Take 1 tablet (4 mg total) by mouth every 8 (eight) hours as needed for nausea or vomiting. 12/08/22   Waldon Merl, PA-C  Prenatal MV & Min w/FA-DHA (PRENATAL GUMMIES PO) Take by mouth.    [provider]  sertraline (ZOLOFT) 50 MG tablet Take 1 tablet (50 mg total) by mouth daily. 05/15/22   DominicCourtney Heys, CNM  SYMBICORT 160-4.5 MCG/ACT inhaler SMARTSIG:2 Puff(s) By Mouth Twice Daily 12/19/21   [provider]    Family History Family History  Problem Relation Age of Onset   Hyperlipidemia Mother    Endometriosis Mother    Anxiety disorder Mother    Depression Mother    Post-traumatic stress disorder Mother    Post-traumatic stress disorder Father    Depression Father    Anxiety disorder Father    Hyperlipidemia Father    Heart disease Father    Heart attack Father 16   Heart failure Father    Asthma Father    Hypertension Father    ADD / ADHD Brother     Social History Social History   Tobacco Use   Smoking status: Former    Current packs/day: 0.00    Average packs/day: 0.3 packs/day for 3.4 years (0.8 ttl pk-yrs)    Types: Cigarettes, E-cigarettes    Start date: 03/01/2017    Quit date: 07/11/2020    Years since quitting: 2.9    Smokeless tobacco: Never   Tobacco comments:    patient states she vapes once a day  Vaping Use   Vaping status: Every Day   Start date: 03/06/2017  Substance Use Topics   Alcohol use: Not Currently  Comment: occassionally   Drug use: Yes    Frequency: 7.0 times per week    Types: Marijuana    Comment: 03/15/22     Allergies   Patient has no known allergies.   Review of Systems Review of Systems  Constitutional:  Positive for fatigue. Negative for chills, diaphoresis and fever.  HENT:  Positive for congestion, ear pain, rhinorrhea and sore throat. Negative for sinus pressure and sinus pain.   Respiratory:  Positive for cough. Negative for shortness of breath.   Cardiovascular:  Negative for chest pain.  Gastrointestinal:  Negative for abdominal pain, nausea and vomiting.  Musculoskeletal:  Negative for arthralgias and myalgias.  Skin:  Negative for rash.  Neurological:  Negative for weakness and headaches.  Hematological:  Negative for adenopathy.     Physical Exam Triage Vital Signs ED Triage Vitals  Encounter Vitals Group     BP      Systolic BP Percentile      Diastolic BP Percentile      Pulse      Resp      Temp      Temp src      SpO2      Weight      Height      Head Circumference      Peak Flow      Pain Score      Pain Loc      Pain Education      Exclude from Growth Chart    No data found.  Updated Vital Signs BP 124/84   Pulse 97   Temp 98.2 F (36.8 C)   Resp 18   SpO2 98%   Physical Exam Vitals and nursing note reviewed.  Constitutional:      General: She is not in acute distress.    Appearance: Normal appearance. She is not ill-appearing or toxic-appearing.  HENT:     Head: Normocephalic and atraumatic.     Right Ear: Tympanic membrane, ear canal and external ear normal.     Left Ear: Tympanic membrane, ear canal and external ear normal.     Nose: Congestion present.     Mouth/Throat:     Mouth: Mucous membranes are moist.      Pharynx: Oropharynx is clear. Posterior oropharyngeal erythema present.  Eyes:     General: No scleral icterus.       Right eye: No discharge.        Left eye: No discharge.     Conjunctiva/sclera: Conjunctivae normal.  Cardiovascular:     Rate and Rhythm: Normal rate and regular rhythm.     Heart sounds: Normal heart sounds.  Pulmonary:     Effort: Pulmonary effort is normal. No respiratory distress.     Breath sounds: Normal breath sounds.  Musculoskeletal:     Cervical back: Neck supple.  Skin:    General: Skin is dry.  Neurological:     General: No focal deficit present.     Mental Status: She is alert. Mental status is at baseline.     Motor: No weakness.     Gait: Gait normal.  Psychiatric:        Mood and Affect: Mood normal.        Behavior: Behavior normal.      UC Treatments / Results  Labs (all labs ordered are listed, but only abnormal results are displayed) Labs Reviewed  RAPID INFLUENZA A&B ANTIGENS  GROUP A STREP BY PCR  EKG   Radiology No results found.  Procedures Procedures (including critical care time)  Medications Ordered in UC Medications - No data to display  Initial Impression / Assessment and Plan / UC Course  I have reviewed the triage vital signs and the nursing notes.  Pertinent labs & imaging results that were available during my care of the patient were reviewed by me and considered in my medical decision making (see chart for details).   22 year old female presents for cough, congestion, sore throat, bilateral ear pain and fatigue x 3 days.  Denies fever, chest pain, shortness of breath, vomiting or diarrhea.  Her kids have been sick with similar symptoms and one of the children had the flu.  Vitals are normal and stable.  She is overall well-appearing.  On exam she has nasal congestion and erythema posterior pharynx.  No evidence of ear infection. Chest clear.  Flu test obtained. Negative.  Strep negative. Declined  COVID.  Viral URI. Advised continuing OTC meds. Reviewed return and ED precautions.   Final Clinical Impressions(s) / UC Diagnoses   Final diagnoses:  Viral upper respiratory tract infection  Acute cough  Nasal congestion  Sore throat     Discharge Instructions      URI/COLD SYMPTOMS: Your exam today is consistent with a viral illness. Antibiotics are not indicated at this time. Use medications as directed, including cough syrup, nasal saline, and decongestants. Your symptoms should improve over the next few days and resolve within 7-10 days. Increase rest and fluids. F/u if symptoms worsen or predominate such as sore throat, ear pain, productive cough, shortness of breath, or if you develop high fevers or worsening fatigue over the next several days.    Flu test is negative.  We will contact you if your strep is positive and send antibiotics to pharmacy.     ED Prescriptions   None    PDMP not reviewed this encounter.   Shirlee Latch, PA-C 06/30/23 1435

## 2023-08-11 ENCOUNTER — Other Ambulatory Visit: Payer: Self-pay | Admitting: Medical Genetics

## 2023-08-14 ENCOUNTER — Other Ambulatory Visit: Payer: Self-pay | Attending: Medical Genetics

## 2023-08-15 IMAGING — US US OB < 14 WEEKS - US OB TV
1 series · 14 of 28 positions shown · non-contrast
Comparison: None.

CLINICAL DATA: Vaginal bleeding. LMP: 07/23/2021 corresponding to
an estimated gestational age of 7 weeks, 6 days.

EXAM:
OBSTETRIC <14 WK US AND TRANSVAGINAL OB US
TECHNIQUE: Both transabdominal and transvaginal ultrasound examinations were
performed for complete evaluation of the gestation as well as the
maternal uterus, adnexal regions, and pelvic cul-de-sac.
Transvaginal technique was performed to assess early pregnancy.

[Series 1: us ob less than 14 weeks with ob transvaginal · 14 of 71 slices shown]
[im 3/71]
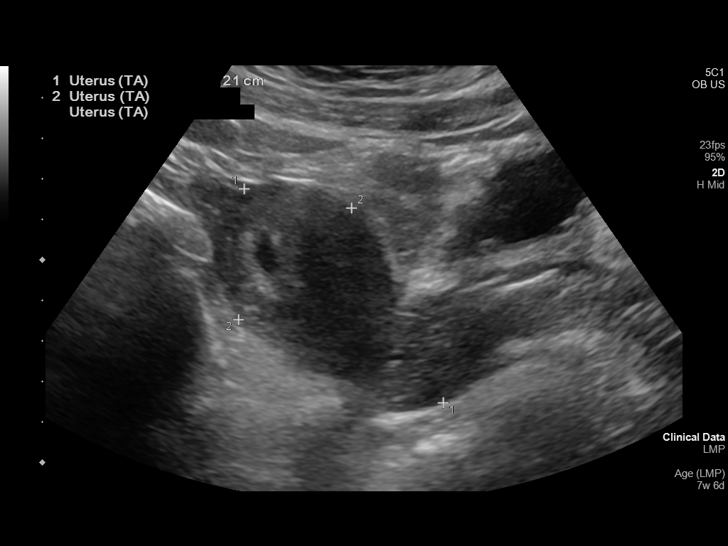
[im 8/71]
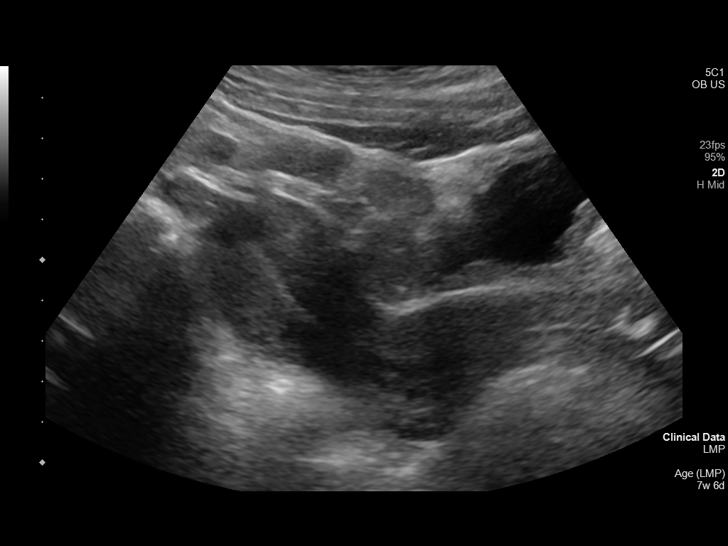
[im 13/71]
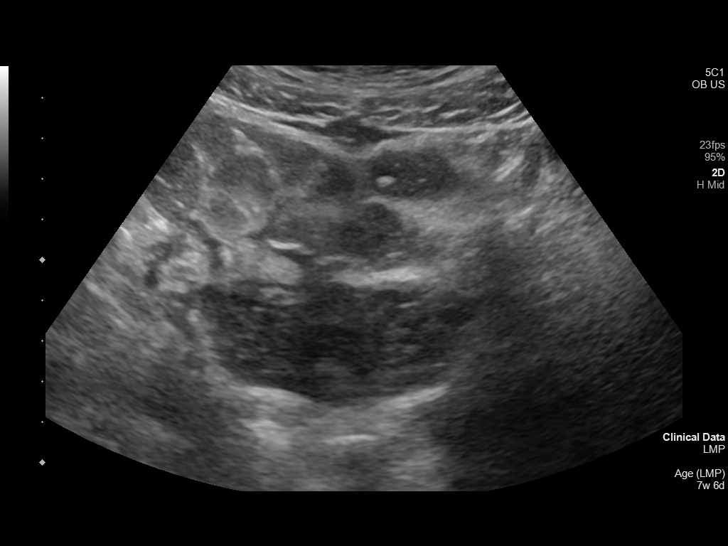
[im 19/71]
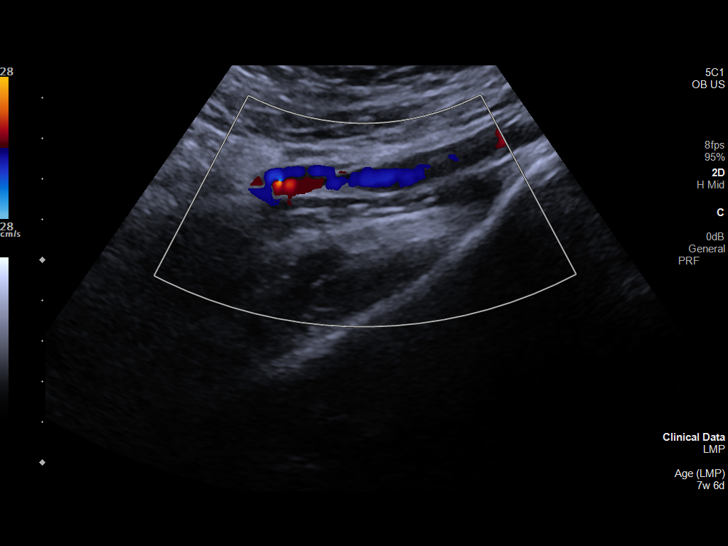
[im 24/71]
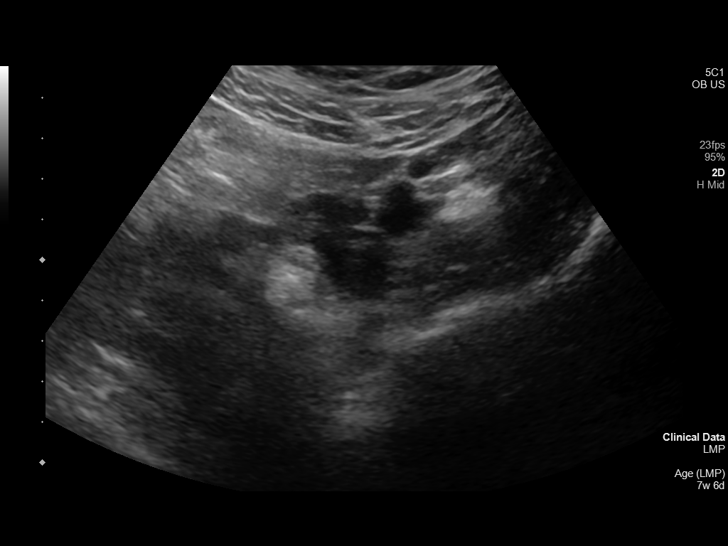
[im 29/71]
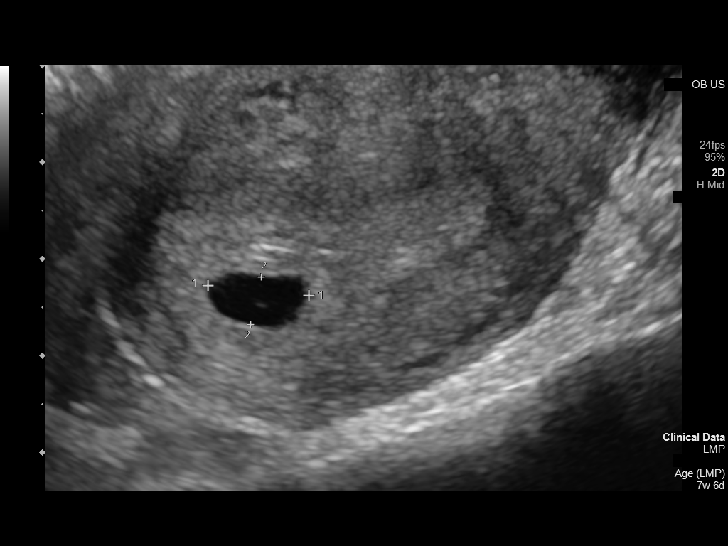
[im 34/71]
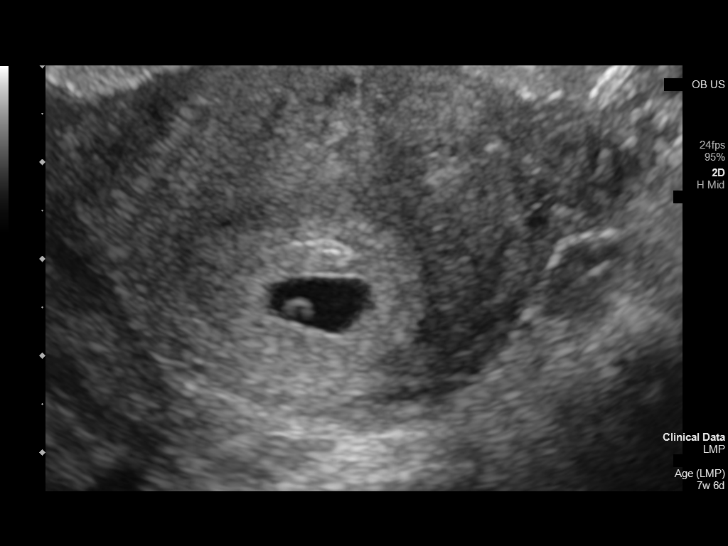
[im 39/71]
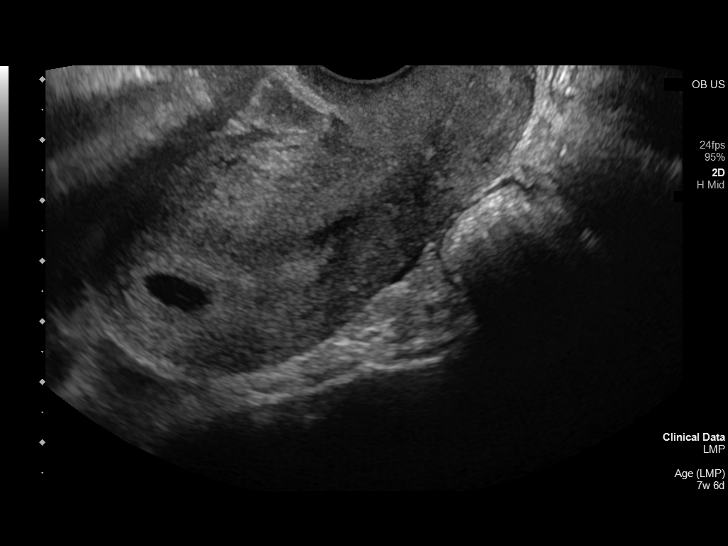
[im 45/71]
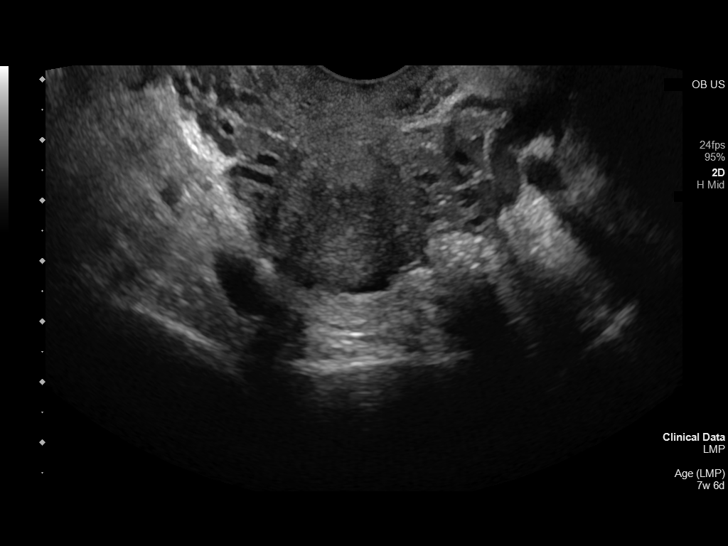
[im 50/71]
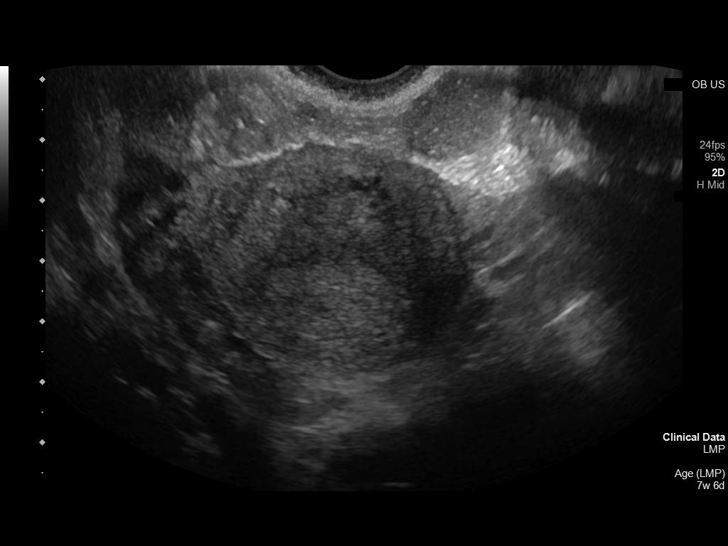
[im 55/71]
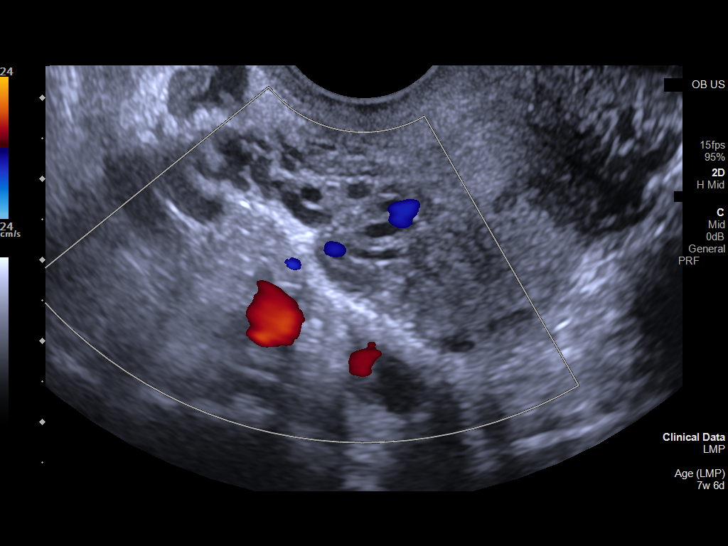
[im 60/71]
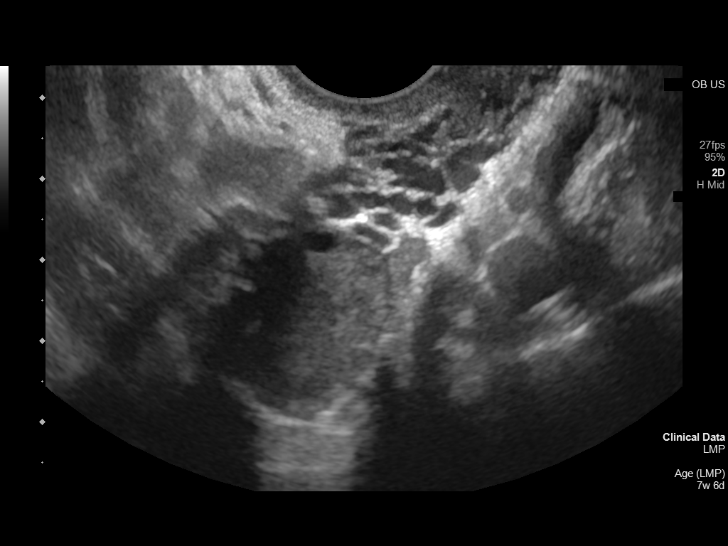
[im 65/71]
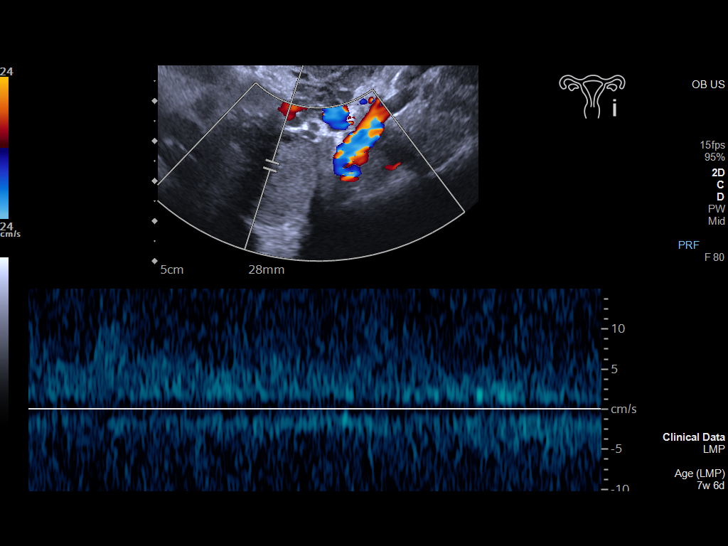
[im 71/71]
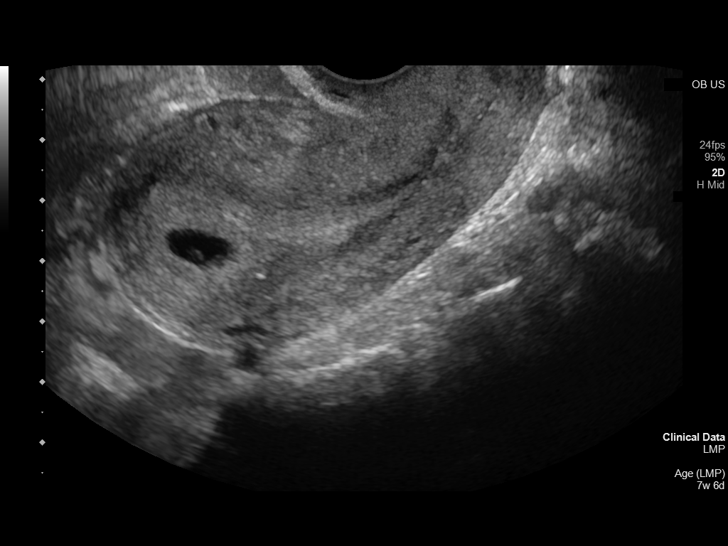

[14 of 28 positions shown; findings below may reference images not displayed]

FINDINGS: Intrauterine gestational sac: Single intrauterine gestational sac.

Yolk sac:  Seen

Embryo:  Not identified with certainty at this time.

Cardiac Activity: N/A

Heart Rate: N/A

MSD: 9 mm   5 w   5 d

Subchorionic hemorrhage:  None visualized.

Maternal uterus/adnexae: The right ovary is not visualized. The left
ovary is unremarkable.
IMPRESSION: Single intrauterine gestational sac with an estimated gestational
age of 5 weeks, 5 days. No fetal pole identified at this time.
Follow-up with ultrasound in 7-11 days, or earlier if clinically
indicated, recommended.

## 2023-08-30 IMAGING — US US OB < 14 WEEKS - US OB TV
1 series · 14 of 28 positions shown · non-contrast
Comparison: Ultrasound 09/16/2021.

CLINICAL DATA: Assessment of fetal viability.  Threatened abortion.

EXAM:
OBSTETRIC <14 WK US AND TRANSVAGINAL OB US
TECHNIQUE: Both transabdominal and transvaginal ultrasound examinations were
performed for complete evaluation of the gestation as well as the
maternal uterus, adnexal regions, and pelvic cul-de-sac.
Transvaginal technique was performed to assess early pregnancy.

[Series 1: us ob < 14 weeks - us ob tv · 0.19mm/px · 14 of 46 slices shown]
[im 2/46]
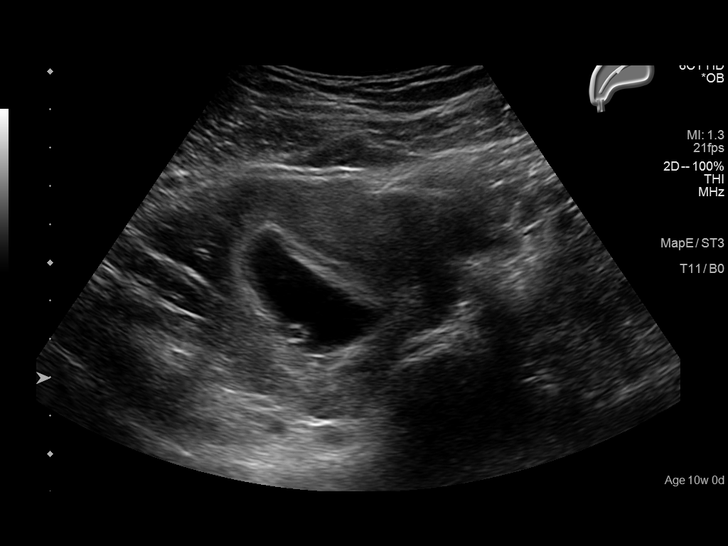
[im 6/46]
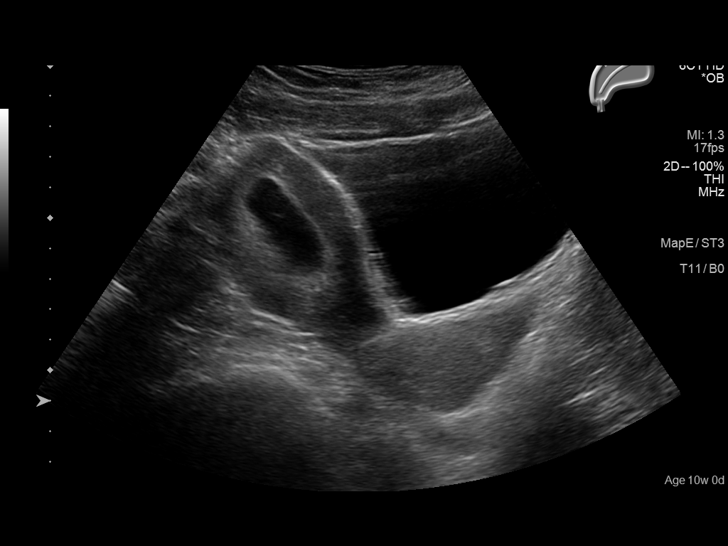
[im 9/46]
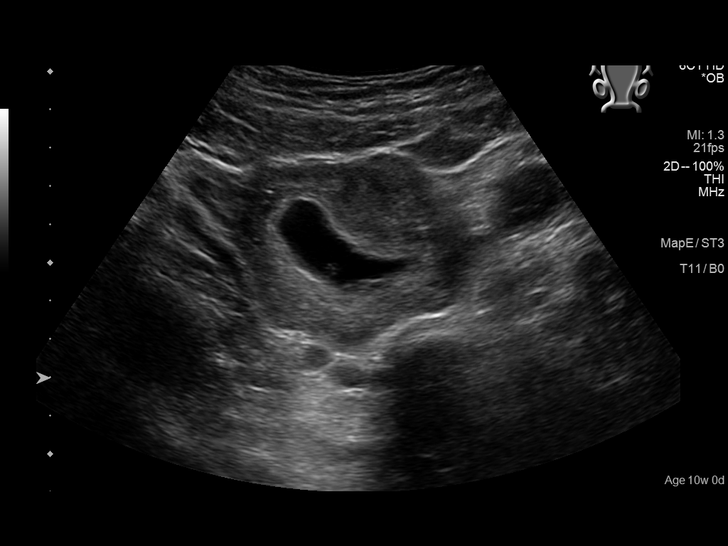
[im 12/46]
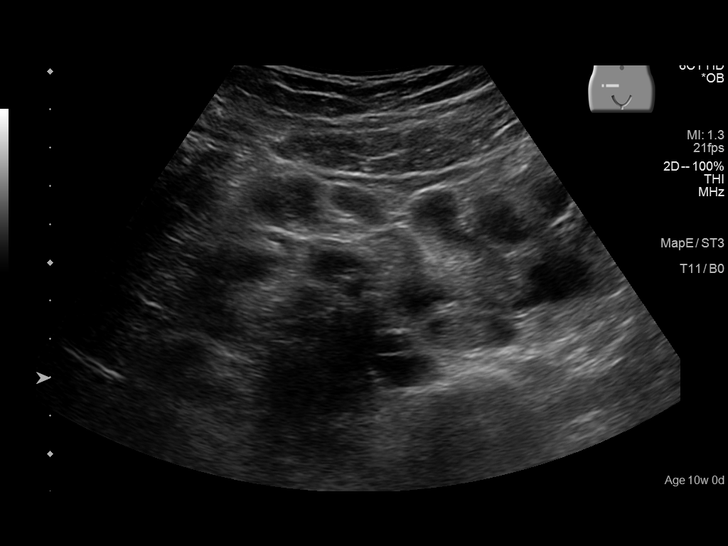
[im 16/46]
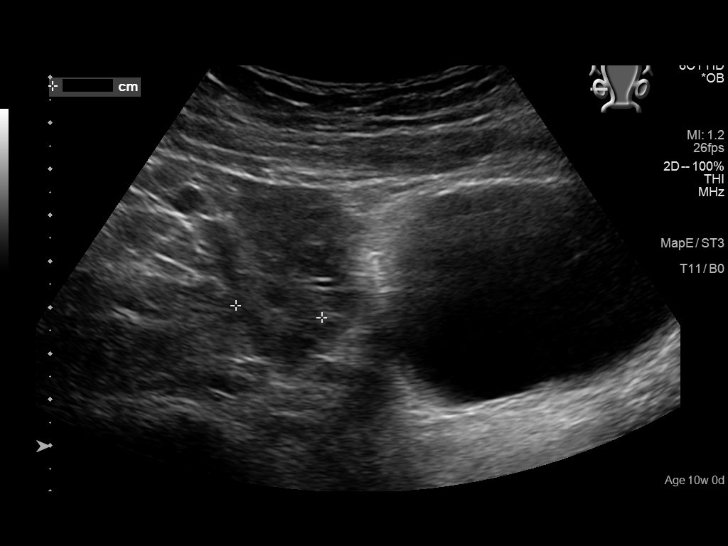
[im 19/46]
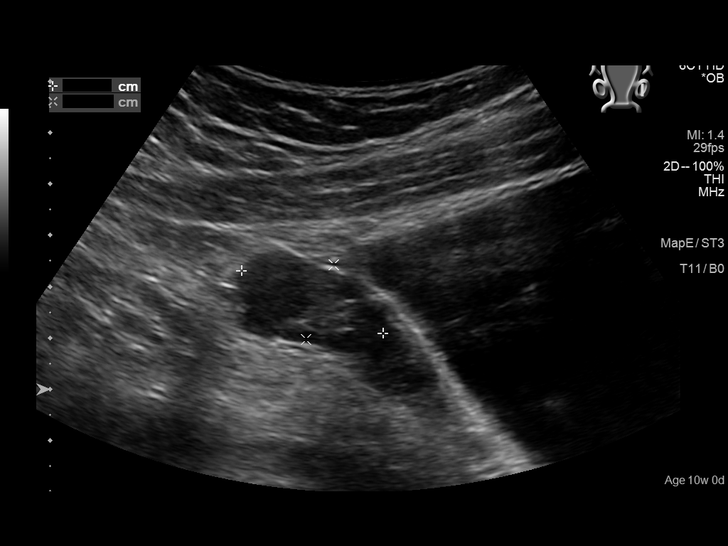
[im 22/46]
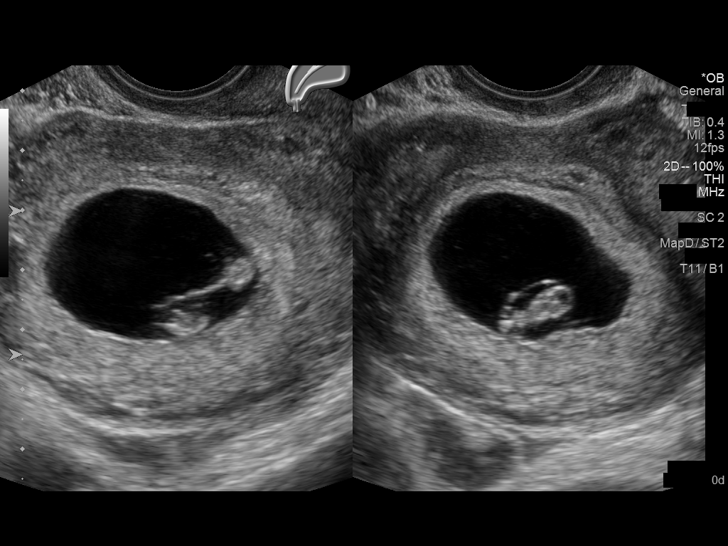
[im 26/46]
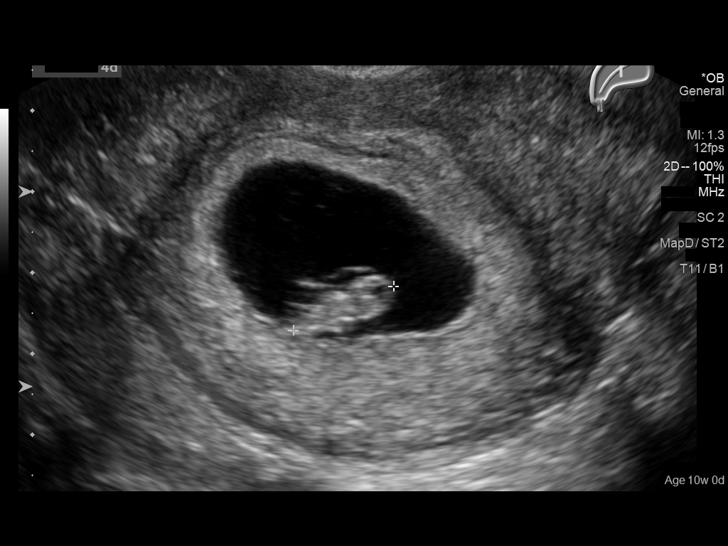
[im 29/46]
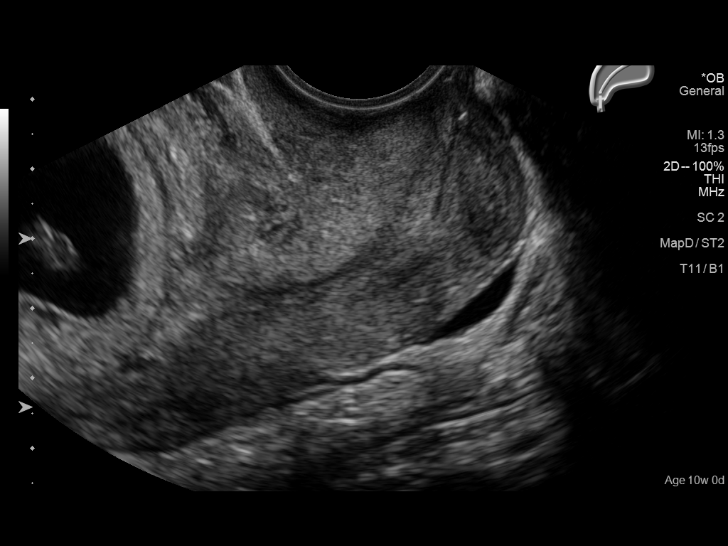
[im 32/46]
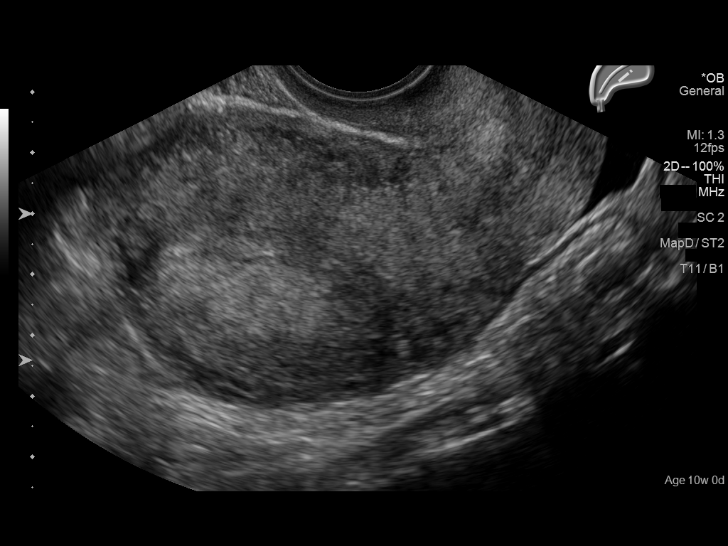
[im 36/46]
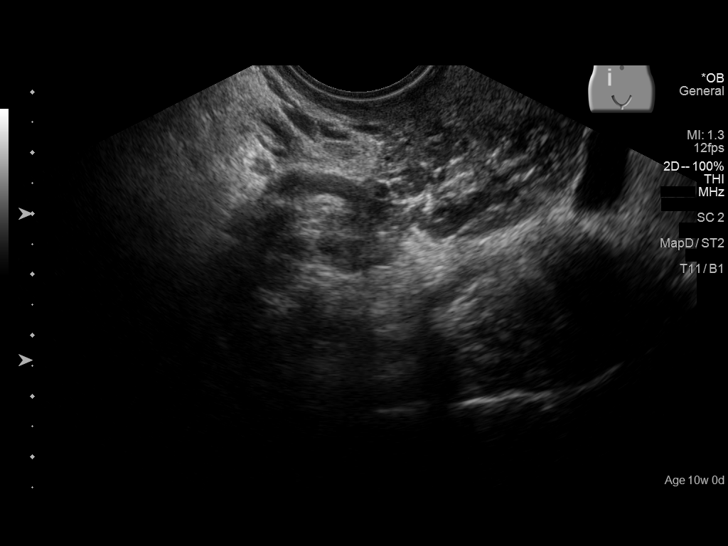
[im 39/46]
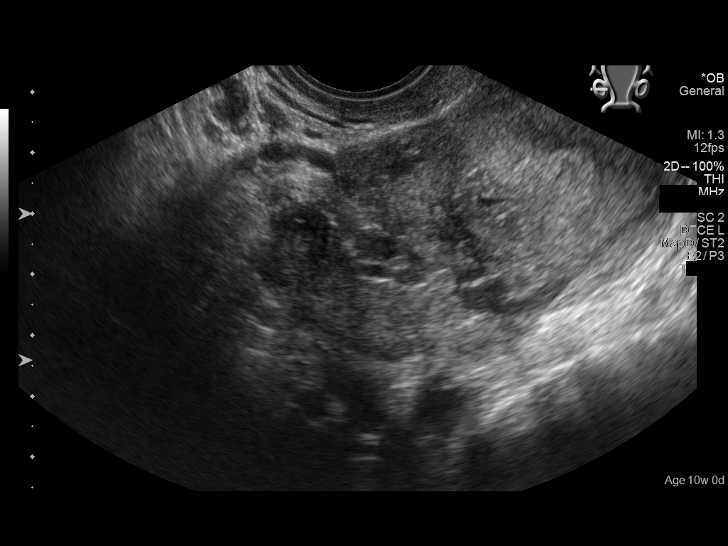
[im 42/46]
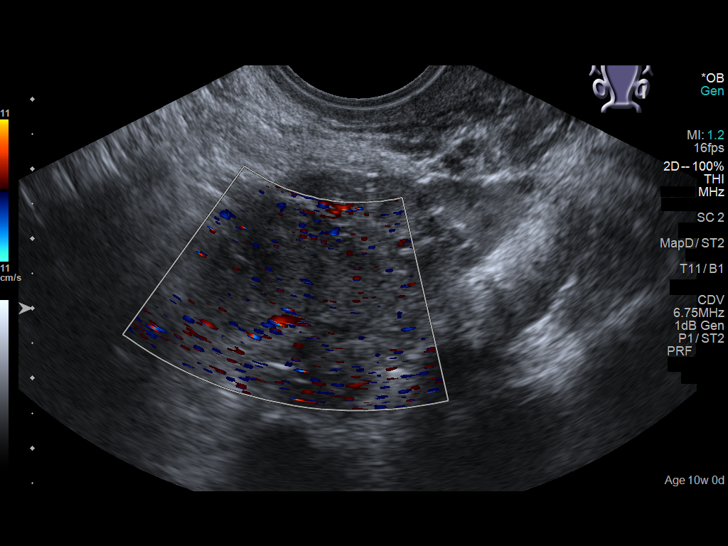
[im 46/46]
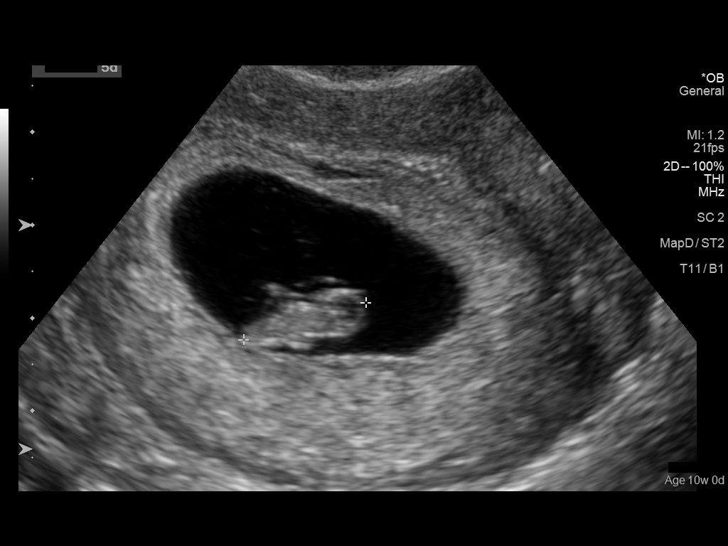

[14 of 28 positions shown; findings below may reference images not displayed]

FINDINGS: Intrauterine gestational sac: Single

Yolk sac:  Present

Embryo:  Present

Cardiac Activity: Present

Heart Rate: 153 bpm

CRL:  13.4 mm   7 w   4 d                  US EDC: 05/16/2022

Subchorionic hemorrhage:  None visualized.

Maternal uterus/adnexae: Cervix intact. Corpus luteal cyst noted the
left ovary. Trace free pelvic fluid noted.
IMPRESSION: Single viable intrauterine pregnancy at 7 weeks 4 days. Trace free
pelvic fluid.

## 2023-11-16 IMAGING — US US OB COMP +14 WK
1 series · 15 of 28 positions shown · non-contrast
Comparison: none

CLINICAL DATA: Anatomy evaluation

EXAM:
OBSTETRICAL ULTRASOUND >14 WKS

[Series 1: us ob comp + 14 wk · 15 of 104 slices shown]
[im 1/104]
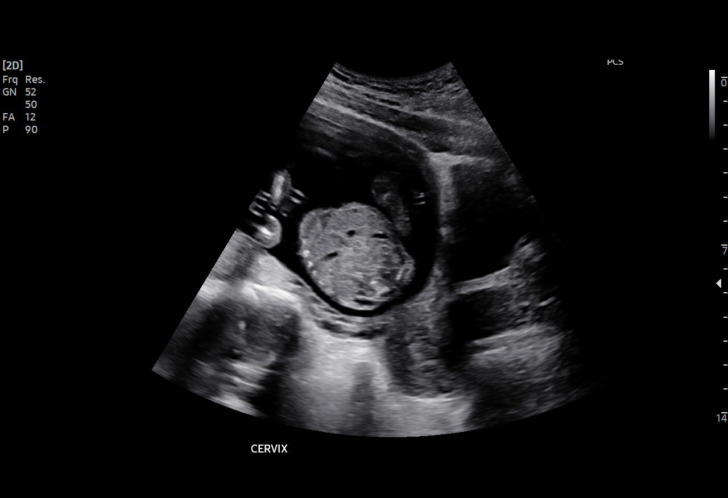
[im 8/104]
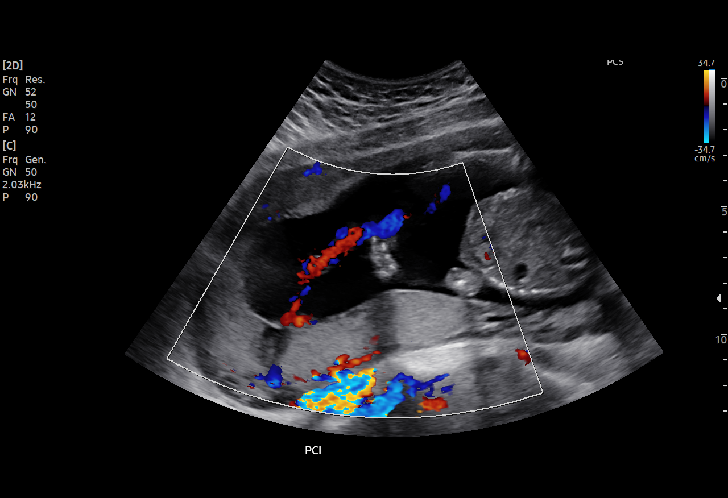
[im 16/104]
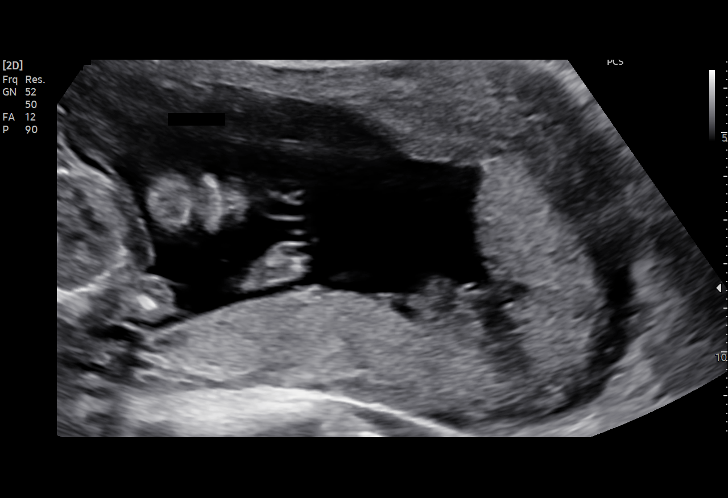
[im 23/104]
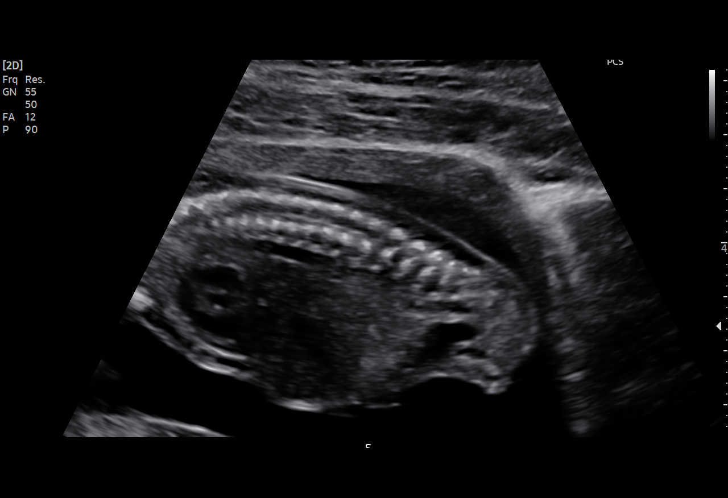
[im 31/104]
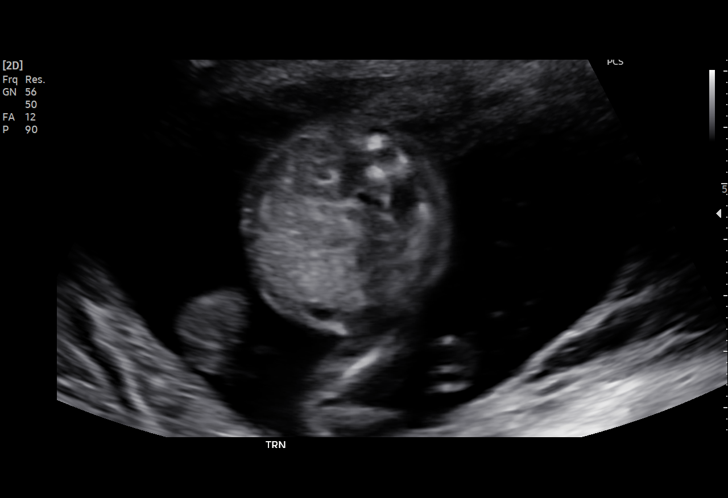
[im 39/104]
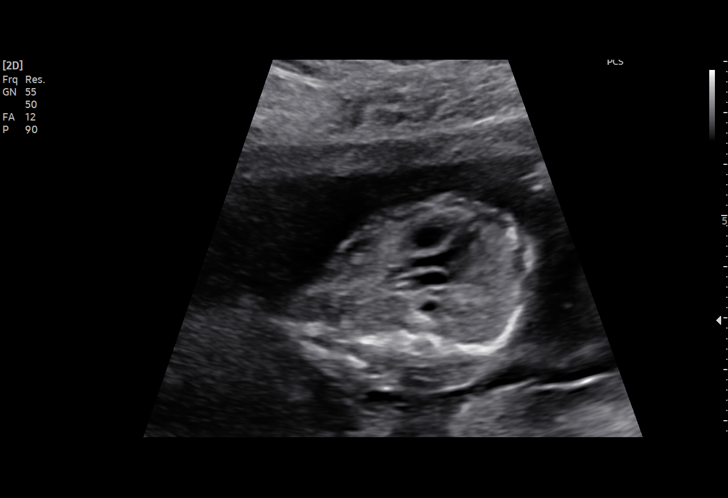
[im 46/104]
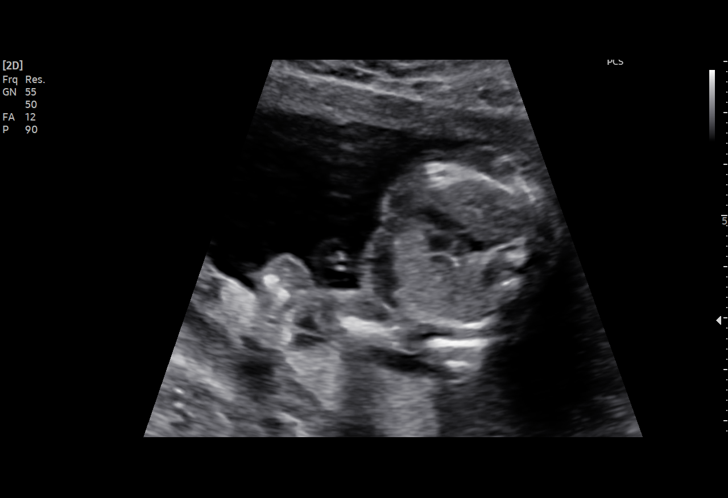
[im 54/104]
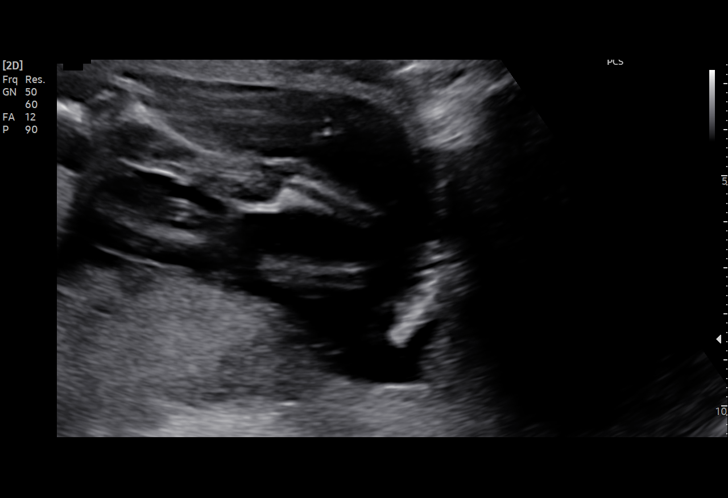
[im 58/104]
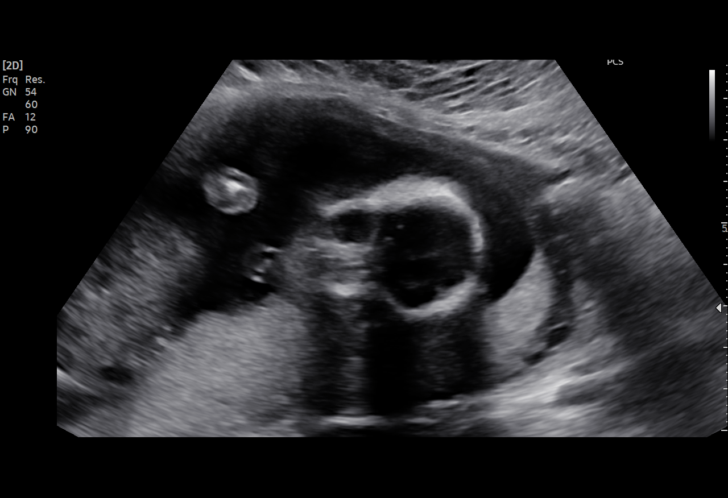
[im 65/104]
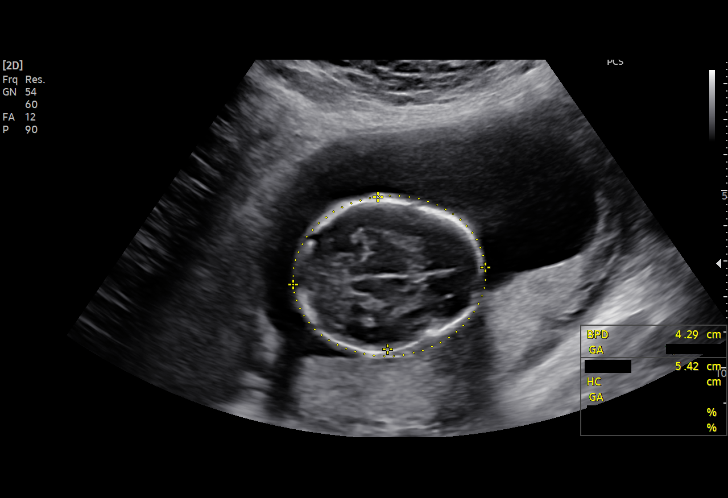
[im 73/104]
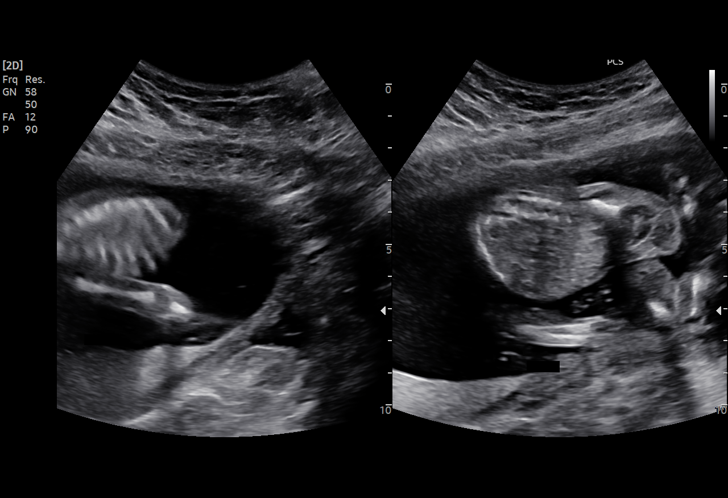
[im 81/104]
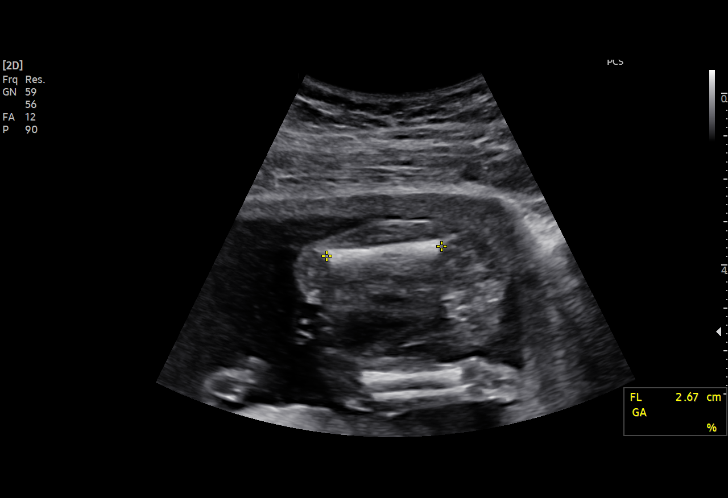
[im 88/104]
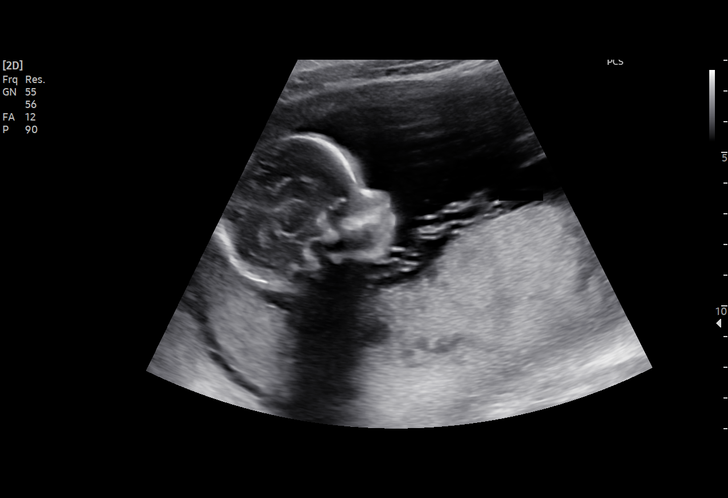
[im 96/104]
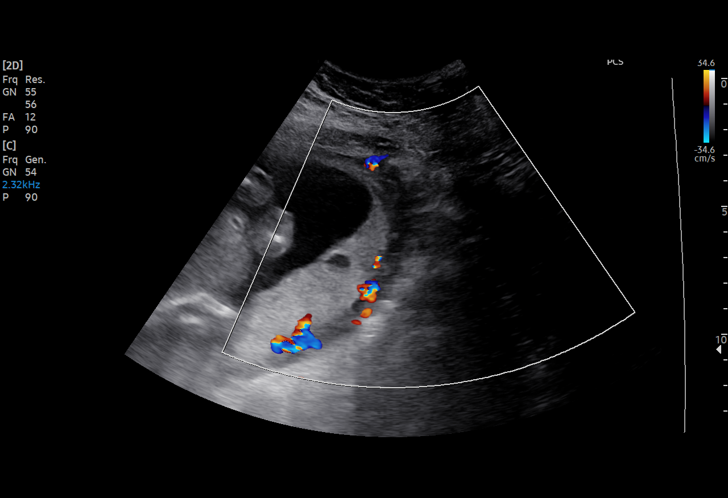
[im 104/104]
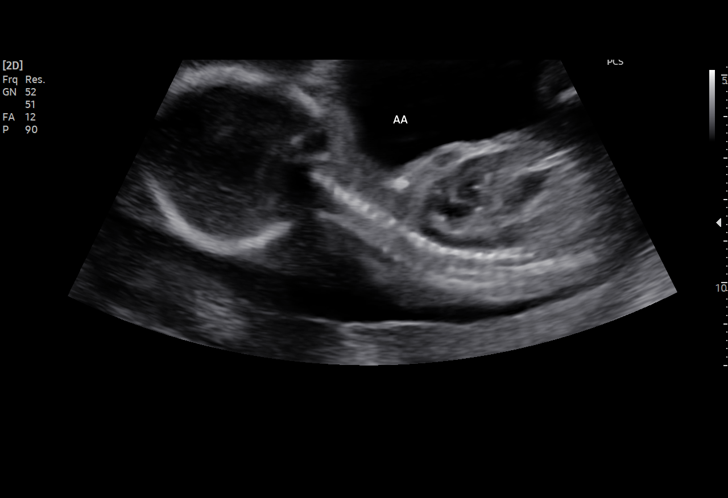

[15 of 28 positions shown; findings below may reference images not displayed]

FINDINGS: Number of Fetuses: 1

Heart Rate:  136 bpm

Movement: Yes

Presentation: Variable

Previa: No

Placental Location: Posterior

Amniotic Fluid (Subjective): Normal

Amniotic Fluid (Objective):

Vertical pocket = 3.8cm

FETAL BIOMETRY

BPD: 4.3cm 19w 0d

HC: 15.7cm 18w 4d

AC: 12.7cm 18w 2d

FL: 2.7cm 18w 1d

Current Mean GA: 18w 4d US EDC: 05/17/2022

Assigned GA:  18w 5d Assigned EDC: 05/16/2022

FETAL ANATOMY

Lateral Ventricles: Appears normal

Thalami/CSP: Appears normal

Posterior Fossa:  Appears normal

Nuchal Region: Appears normal   NFT= N/A > 20 WKS

Upper Lip: Appears normal

Spine: Appears normal

4 Chamber Heart on Left: Appears normal

LVOT: Appears normal

RVOT: Appears normal

Stomach on Left: Appears normal

3 Vessel Cord: Appears normal

Cord Insertion site: Appears normal

Kidneys: Appears normal

Bladder: Appears normal

Extremities: Appears normal

Technically difficult due to: None

Maternal Findings:

Cervix: 3.5 cm. Closed.
IMPRESSION: 1. Single live intrauterine pregnancy as detailed above.

## 2023-12-23 ENCOUNTER — Ambulatory Visit: Payer: MEDICAID

## 2024-05-16 ENCOUNTER — Telehealth: Payer: Self-pay

## 2024-05-16 NOTE — Telephone Encounter (Signed)
 Patient calling in due to 5 weeks of abnormal moderate to heavy bleeding since Nexplanon  placement on 7/2 with Lydia. She is wanting to know if she can be sent in a medication to stop the bleeding. Please advise

## 2024-06-03 ENCOUNTER — Other Ambulatory Visit: Payer: Self-pay | Admitting: Medical Genetics

## 2024-06-03 DIAGNOSIS — Z006 Encounter for examination for normal comparison and control in clinical research program: Secondary | ICD-10-CM

## 2024-06-13 ENCOUNTER — Ambulatory Visit: Payer: MEDICAID | Admitting: Licensed Practical Nurse

## 2024-07-15 ENCOUNTER — Other Ambulatory Visit (HOSPITAL_COMMUNITY)
Admission: RE | Admit: 2024-07-15 | Discharge: 2024-07-15 | Disposition: A | Payer: MEDICAID | Source: Ambulatory Visit | Attending: Licensed Practical Nurse | Admitting: Licensed Practical Nurse

## 2024-07-15 ENCOUNTER — Ambulatory Visit: Payer: MEDICAID | Admitting: Licensed Practical Nurse

## 2024-07-15 ENCOUNTER — Encounter: Payer: Self-pay | Admitting: Licensed Practical Nurse

## 2024-07-15 VITALS — BP 141/89 | HR 86 | Ht 66.0 in | Wt 212.2 lb

## 2024-07-15 DIAGNOSIS — Z113 Encounter for screening for infections with a predominantly sexual mode of transmission: Secondary | ICD-10-CM | POA: Insufficient documentation

## 2024-07-15 DIAGNOSIS — R030 Elevated blood-pressure reading, without diagnosis of hypertension: Secondary | ICD-10-CM | POA: Diagnosis not present

## 2024-07-15 DIAGNOSIS — Z01411 Encounter for gynecological examination (general) (routine) with abnormal findings: Secondary | ICD-10-CM

## 2024-07-15 DIAGNOSIS — Z13220 Encounter for screening for lipoid disorders: Secondary | ICD-10-CM

## 2024-07-15 DIAGNOSIS — Z3046 Encounter for surveillance of implantable subdermal contraceptive: Secondary | ICD-10-CM | POA: Diagnosis not present

## 2024-07-15 DIAGNOSIS — Z01419 Encounter for gynecological examination (general) (routine) without abnormal findings: Secondary | ICD-10-CM | POA: Diagnosis present

## 2024-07-15 DIAGNOSIS — Z124 Encounter for screening for malignant neoplasm of cervix: Secondary | ICD-10-CM

## 2024-07-15 DIAGNOSIS — Z131 Encounter for screening for diabetes mellitus: Secondary | ICD-10-CM
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
## 2024-07-16 LAB — COMPREHENSIVE METABOLIC PANEL WITH GFR
ALT: 33 IU/L — ABNORMAL HIGH (ref 0–32)
AST: 16 IU/L (ref 0–40)
Albumin: 4.7 g/dL (ref 4.0–5.0)
Alkaline Phosphatase: 104 IU/L (ref 41–116)
BUN/Creatinine Ratio: 5 — ABNORMAL LOW (ref 9–23)
BUN: 4 mg/dL — ABNORMAL LOW (ref 6–20)
Bilirubin Total: 0.3 mg/dL (ref 0.0–1.2)
CO2: 22 mmol/L (ref 20–29)
Calcium: 9.6 mg/dL (ref 8.7–10.2)
Chloride: 101 mmol/L (ref 96–106)
Creatinine, Ser: 0.79 mg/dL (ref 0.57–1.00)
Globulin, Total: 2.7 g/dL (ref 1.5–4.5)
Glucose: 88 mg/dL (ref 70–99)
Potassium: 3.8 mmol/L (ref 3.5–5.2)
Sodium: 138 mmol/L (ref 134–144)
Total Protein: 7.4 g/dL (ref 6.0–8.5)
eGFR: 107 mL/min/1.73 (ref >59)

## 2024-07-16 LAB — CBC WITH DIFFERENTIAL/PLATELET
Basophils Absolute: 0.1 x10E3/uL (ref 0.0–0.2)
Basos: 1 %
EOS (ABSOLUTE): 0 x10E3/uL (ref 0.0–0.4)
Eos: 0 %
Hematocrit: 39.8 % (ref 34.0–46.6)
Hemoglobin: 12.6 g/dL (ref 11.1–15.9)
Immature Grans (Abs): 0 x10E3/uL (ref 0.0–0.1)
Immature Granulocytes: 0 %
Lymphocytes Absolute: 2.4 x10E3/uL (ref 0.7–3.1)
Lymphs: 36 %
MCH: 28.1 pg (ref 26.6–33.0)
MCHC: 31.7 g/dL (ref 31.5–35.7)
MCV: 89 fL (ref 79–97)
Monocytes Absolute: 0.4 x10E3/uL (ref 0.1–0.9)
Monocytes: 6 %
Neutrophils Absolute: 3.9 x10E3/uL (ref 1.4–7.0)
Neutrophils: 57 %
Platelets: 384 x10E3/uL (ref 150–450)
RBC: 4.49 x10E6/uL (ref 3.77–5.28)
RDW: 13.2 % (ref 11.7–15.4)
WBC: 6.8 x10E3/uL (ref 3.4–10.8)

## 2024-07-16 LAB — HEMOGLOBIN A1C
Est. average glucose Bld gHb Est-mCnc: 105 mg/dL
Hgb A1c MFr Bld: 5.3 % (ref 4.8–5.6)

## 2024-07-16 LAB — HEP, RPR, HIV PANEL
HIV Screen 4th Generation wRfx: NONREACTIVE
Hepatitis B Surface Ag: NEGATIVE
RPR Ser Ql: NONREACTIVE

## 2024-07-16 LAB — LIPID PANEL
Chol/HDL Ratio: 4.8 ratio — ABNORMAL HIGH (ref 0.0–4.4)
Cholesterol, Total: 187 mg/dL (ref 100–199)
HDL: 39 mg/dL — ABNORMAL LOW (ref >39)
LDL Chol Calc (NIH): 133 mg/dL — ABNORMAL HIGH (ref 0–99)
Triglycerides: 81 mg/dL (ref 0–149)
VLDL Cholesterol Cal: 15 mg/dL (ref 5–40)

## 2024-07-16 LAB — HEPATITIS C ANTIBODY: Hep C Virus Ab: NONREACTIVE

## 2024-07-19 ENCOUNTER — Ambulatory Visit: Payer: Self-pay | Admitting: Licensed Practical Nurse

## 2024-07-19 LAB — CYTOLOGY - PAP
Chlamydia: NEGATIVE
Comment: NEGATIVE
Comment: NEGATIVE
Comment: NORMAL
Diagnosis: NEGATIVE
High risk HPV: NEGATIVE
Neisseria Gonorrhea: NEGATIVE

## 2024-08-15 ENCOUNTER — Encounter: Payer: Self-pay | Admitting: Licensed Practical Nurse

## 2024-08-26 ENCOUNTER — Encounter: Payer: Self-pay | Admitting: Licensed Practical Nurse

## 2024-08-26 ENCOUNTER — Ambulatory Visit: Payer: MEDICAID | Admitting: Licensed Practical Nurse

## 2024-08-26 VITALS — BP 128/99 | HR 95 | Ht 66.0 in | Wt 215.6 lb

## 2024-08-26 DIAGNOSIS — Z30011 Encounter for initial prescription of contraceptive pills: Secondary | ICD-10-CM | POA: Diagnosis not present

## 2024-08-26 DIAGNOSIS — Z3202 Encounter for pregnancy test, result negative: Secondary | ICD-10-CM | POA: Diagnosis not present

## 2024-08-26 LAB — POCT URINE PREGNANCY: Preg Test, Ur: NEGATIVE

## 2024-08-26 MED ORDER — NORETHINDRONE 0.35 MG PO TABS: 1.0000 | ORAL_TABLET | Freq: Every day | ORAL | 11 refills | Status: AC

## 2024-08-26 NOTE — Progress Notes (Signed)
 "   Rollene Therisa BRAVO, FNP (Inactive)   No chief complaint on file.   HPI:      Grace [name] is a 24 y.o. [phone] whose LMP was No LMP recorded., presents today for here for contraception   Nexplanon  was removed Nov 21 had a Cycle Nov 24-29 and then Dec 7-12, spotting Dec 22-30  has had Breast tenderness, mild cramping,and fatigue  Had IC dec 19, 27, 30, has occasionally used withdrawal method, she is ambivalent about another pregnancy if it happens, it happens she does desire another child in the future. But does want to start birth control today, she is not interested in the IUD. She does vape. Her BP is 128/99, she has an appointment at [facility] on Jan [facility]. Declines STI testing  Took UPT which was  negative      Patient Active Problem List   Diagnosis Date Noted   Mood disorder 11/15/2018   PTSD (post-traumatic stress disorder) 11/15/2018   Major depression, recurrent, chronic 08/06/2018   GAD (generalized anxiety disorder) 08/06/2018   Allergy to nuts 09/15/2016   Chronic allergic rhinitis 12/20/2015   Primary dysmenorrhea 12/20/2015    Past Surgical History:  Procedure Laterality Date   LAPAROSCOPY  05/07/2017   Procedure: LAPAROSCOPY DIAGNOSTIC;  Surgeon: Schermerhorn, Debby PARAS, MD;  Location: [facility] ORS;  Service: Gynecology;;    Family History  Problem Relation Age of Onset   Hyperlipidemia Mother    Endometriosis Mother    Anxiety disorder Mother    Depression Mother    Post-traumatic stress disorder Mother    Post-traumatic stress disorder Father    Depression Father    Anxiety disorder Father    Hyperlipidemia Father    Heart disease Father    Heart attack Father 8   Heart failure Father    Asthma Father    Hypertension Father    ADD / ADHD Brother     Social History   Socioeconomic History   Marital status: Single    Spouse name: Not on file   Number of children: 1   Years of education: 14   Highest education level: Some college, no degree   Occupational History   Occupation: unemployed  Tobacco Use   Smoking status: Former    Current packs/day: 0.00    Average packs/day: 0.3 packs/day for 3.4 years (0.8 ttl pk-yrs)    Types: Cigarettes, E-cigarettes    Start date: 03/01/2017    Quit date: 07/11/2020    Years since quitting: 4.1   Smokeless tobacco: Never   Tobacco comments:    patient states she vapes once a day  Vaping Use   Vaping status: Every Day   Start date: 03/06/2017  Substance and Sexual Activity   Alcohol use: Not Currently    Comment: occassionally   Drug use: Not Currently    Frequency: 7.0 times per week    Types: Marijuana    Comment: 03/15/22   Sexual activity: Yes    Partners: Male    Birth control/protection: Pill  Other Topics Concern   Not on file  Social History Narrative   Relationship with parents is a little better   Social Drivers of Health   Tobacco Use: Medium Risk (07/15/2024)   Patient History    Smoking Tobacco Use: Former    Smokeless Tobacco Use: Never    Passive Exposure: Not on Actuary Strain: Not on file  Food Insecurity: Not on file  Transportation Needs: No  Transportation Needs (09/15/2023)   [facility] - Transportation    In the past 12 months, has lack of transportation kept you from medical appointments or from getting medications?: No    Lack of Transportation (Non-Medical): No  Physical Activity: Not on file  Stress: Not on file  Social Connections: Not on file  Intimate Partner Violence: Not on file  Depression (EYV7-0): Not on file  Alcohol Screen: Not on file  Housing: Low Risk  (09/15/2023)   Received from [facility]   Epic    In the last 12 months, was there a time when you were not able to pay the mortgage or rent on time?: No    In the past 12 months, how many times have you moved where you were living?: 1    At any time in the past 12 months, were you homeless or living in a  shelter (including now)?: No  Utilities: Not At Risk (09/15/2023)   Received from [facility] Utilities    Threatened with loss of utilities: No  Health Literacy: Not on file    Outpatient Medications Prior to Visit  Medication Sig Dispense Refill   Butalbital -APAP-Caffeine  50-325-40 MG capsule Take 1-2 capsules by mouth every 6 (six) hours as needed for headache. (Patient not taking: Reported on 02/24/2023) 10 capsule 0   No facility-administered medications prior to visit.      ROS:  Review of Systems see HPI    OBJECTIVE:   Vitals:  BP (!) 128/99   Pulse 95   Ht 5' 6 (1.676 m)   Wt 215 lb 9.6 oz (97.8 kg)   BMI 34.80 kg/m   Physical Exam Constitutional:      Appearance: Normal appearance.  Pulmonary:     Effort: Pulmonary effort is normal.  Genitourinary:    General: Normal vulva.     Comments: SSE: cervix pink no lesions, light bleeding from os  Skin:    General: Skin is warm.  Neurological:     General: No focal deficit present.     Mental Status: She is alert.  Psychiatric:        Mood and Affect: Mood normal.        Thought Content: Thought content normal.     Results: Results for orders placed or performed in visit on 08/26/24 (from the past 24 hours)  POCT urine pregnancy     Status: None   Collection Time: 08/26/24 10:54 AM  Result Value Ref Range   Preg Test, Ur Negative Negative     Assessment/Plan: 1. Encounter for initial prescription of contraceptive pills (Primary) - POCT urine pregnancy   -Discussed spotting most likely form her body adjusting after the Nexplanon  removal .   -return in about 1 year, or sooner with any concerns   No orders of the defined types were placed in this encounter.    JINNIE HERO Karianna Gusman, CNM 08/26/2024 11:15 AM      "
